# Patient Record
Sex: Female | Born: 1935
Health system: Southern US, Community
[De-identification: ages and names within clinical notes are randomized; demographics above are authoritative.]

## PROBLEM LIST (undated history)

## (undated) DIAGNOSIS — I4891 Unspecified atrial fibrillation: Secondary | ICD-10-CM

## (undated) DIAGNOSIS — C801 Malignant (primary) neoplasm, unspecified: Secondary | ICD-10-CM

## (undated) DIAGNOSIS — T4145XA Adverse effect of unspecified anesthetic, initial encounter: Secondary | ICD-10-CM

## (undated) DIAGNOSIS — E785 Hyperlipidemia, unspecified: Secondary | ICD-10-CM

## (undated) DIAGNOSIS — M1990 Unspecified osteoarthritis, unspecified site: Secondary | ICD-10-CM

## (undated) DIAGNOSIS — I10 Essential (primary) hypertension: Secondary | ICD-10-CM

## (undated) DIAGNOSIS — R7611 Nonspecific reaction to tuberculin skin test without active tuberculosis: Secondary | ICD-10-CM

## (undated) DIAGNOSIS — I499 Cardiac arrhythmia, unspecified: Secondary | ICD-10-CM

## (undated) DIAGNOSIS — T8859XA Other complications of anesthesia, initial encounter: Secondary | ICD-10-CM

## (undated) HISTORY — PX: APPENDECTOMY: SHX54

## (undated) HISTORY — PX: BREAST LUMPECTOMY: SHX2

## (undated) HISTORY — PX: CATARACT EXTRACTION: SUR2

## (undated) HISTORY — PX: CARDIAC CATHETERIZATION: SHX172

## (undated) HISTORY — PX: COLONOSCOPY W/ POLYPECTOMY: SHX1380

---

## 1966-06-23 DIAGNOSIS — R7611 Nonspecific reaction to tuberculin skin test without active tuberculosis: Secondary | ICD-10-CM

## 1966-06-23 HISTORY — DX: Nonspecific reaction to tuberculin skin test without active tuberculosis: R76.11

## 1975-09-20 HISTORY — PX: ABDOMINAL HYSTERECTOMY: SHX81

## 2001-02-15 HISTORY — PX: COLPORRHAPHY: SHX921

## 2008-06-15 HISTORY — PX: ROTATOR CUFF REPAIR: SHX139

## 2009-12-18 ENCOUNTER — Encounter: Admission: RE | Admit: 2009-12-18 | Discharge: 2009-12-18 | Payer: Self-pay | Admitting: Neurosurgery

## 2010-02-12 ENCOUNTER — Encounter: Admission: RE | Admit: 2010-02-12 | Discharge: 2010-02-12 | Payer: Self-pay | Admitting: Neurosurgery

## 2010-03-12 ENCOUNTER — Inpatient Hospital Stay (HOSPITAL_COMMUNITY): Admission: RE | Admit: 2010-03-12 | Discharge: 2010-03-14 | Payer: Self-pay | Admitting: Neurosurgery

## 2010-03-12 HISTORY — PX: BACK SURGERY: SHX140

## 2010-03-22 ENCOUNTER — Encounter: Admission: RE | Admit: 2010-03-22 | Discharge: 2010-03-22 | Payer: Self-pay | Admitting: Neurosurgery

## 2010-09-05 LAB — PROTIME-INR
INR: 0.96 (ref 0.00–1.49)
INR: 1.73 — ABNORMAL HIGH (ref 0.00–1.49)
Prothrombin Time: 13 seconds (ref 11.6–15.2)
Prothrombin Time: 20.4 seconds — ABNORMAL HIGH (ref 11.6–15.2)

## 2010-09-05 LAB — CBC
HCT: 42.9 % (ref 36.0–46.0)
Hemoglobin: 14.8 g/dL (ref 12.0–15.0)
MCH: 32.8 pg (ref 26.0–34.0)
MCHC: 34.5 g/dL (ref 30.0–36.0)
MCV: 95.1 fL (ref 78.0–100.0)
Platelets: 310 10*3/uL (ref 150–400)
RBC: 4.51 MIL/uL (ref 3.87–5.11)
RDW: 13 % (ref 11.5–15.5)
WBC: 7.3 10*3/uL (ref 4.0–10.5)

## 2010-09-05 LAB — BASIC METABOLIC PANEL
BUN: 15 mg/dL (ref 6–23)
CO2: 30 mEq/L (ref 19–32)
Calcium: 10.2 mg/dL (ref 8.4–10.5)
Chloride: 100 mEq/L (ref 96–112)
Creatinine, Ser: 0.77 mg/dL (ref 0.4–1.2)
GFR calc Af Amer: >60 mL/min (ref >60)
GFR calc non Af Amer: >60 mL/min (ref >60)
Glucose, Bld: 92 mg/dL (ref 70–99)
Potassium: 3.2 mEq/L — ABNORMAL LOW (ref 3.5–5.1)
Sodium: 139 mEq/L (ref 135–145)

## 2010-09-05 LAB — ABO/RH: ABO/RH(D): A POS

## 2010-09-05 LAB — TYPE AND SCREEN
ABO/RH(D): A POS
Antibody Screen: NEGATIVE

## 2010-09-05 LAB — APTT: aPTT: 38 seconds — ABNORMAL HIGH (ref 24–37)

## 2010-09-05 LAB — SURGICAL PCR SCREEN
MRSA, PCR: NEGATIVE
Staphylococcus aureus: NEGATIVE

## 2010-11-14 HISTORY — PX: BREAST SURGERY: SHX581

## 2012-06-11 ENCOUNTER — Telehealth: Payer: Self-pay

## 2015-03-05 ENCOUNTER — Other Ambulatory Visit: Payer: Self-pay | Admitting: Specialist

## 2015-03-05 DIAGNOSIS — M545 Low back pain: Secondary | ICD-10-CM

## 2015-03-07 ENCOUNTER — Other Ambulatory Visit: Payer: Self-pay

## 2015-03-09 ENCOUNTER — Inpatient Hospital Stay: Admission: RE | Admit: 2015-03-09 | Payer: Self-pay | Source: Ambulatory Visit

## 2015-03-14 ENCOUNTER — Ambulatory Visit
Admission: RE | Admit: 2015-03-14 | Discharge: 2015-03-14 | Disposition: A | Discharge status: Home / Self Care | Payer: PPO | Source: Ambulatory Visit | Attending: Specialist | Admitting: Specialist

## 2015-08-14 DIAGNOSIS — M5441 Lumbago with sciatica, right side: Secondary | ICD-10-CM | POA: Diagnosis not present

## 2015-08-23 DIAGNOSIS — M5416 Radiculopathy, lumbar region: Secondary | ICD-10-CM | POA: Diagnosis not present

## 2015-08-29 ENCOUNTER — Other Ambulatory Visit: Payer: Self-pay | Admitting: Specialist

## 2015-08-29 DIAGNOSIS — M545 Low back pain: Secondary | ICD-10-CM

## 2015-09-07 ENCOUNTER — Ambulatory Visit
Admission: RE | Admit: 2015-09-07 | Discharge: 2015-09-07 | Disposition: A | Discharge status: Home / Self Care | Payer: PPO | Source: Ambulatory Visit | Attending: Specialist | Admitting: Specialist

## 2015-09-07 DIAGNOSIS — M545 Low back pain: Secondary | ICD-10-CM

## 2015-09-07 DIAGNOSIS — M5126 Other intervertebral disc displacement, lumbar region: Secondary | ICD-10-CM | POA: Diagnosis not present

## 2015-09-07 MED ORDER — GADOBENATE DIMEGLUMINE 529 MG/ML IV SOLN
10.0000 mL | Freq: Once | INTRAVENOUS | Status: AC | PRN
  Administered 2015-09-07: 10 mL via INTRAVENOUS

## 2015-09-12 DIAGNOSIS — E785 Hyperlipidemia, unspecified: Secondary | ICD-10-CM | POA: Diagnosis not present

## 2015-09-12 DIAGNOSIS — M5441 Lumbago with sciatica, right side: Secondary | ICD-10-CM | POA: Diagnosis not present

## 2015-09-12 DIAGNOSIS — I10 Essential (primary) hypertension: Secondary | ICD-10-CM | POA: Diagnosis not present

## 2015-09-12 DIAGNOSIS — Z01818 Encounter for other preprocedural examination: Secondary | ICD-10-CM | POA: Diagnosis not present

## 2015-09-13 DIAGNOSIS — Z8679 Personal history of other diseases of the circulatory system: Secondary | ICD-10-CM | POA: Diagnosis not present

## 2015-09-13 DIAGNOSIS — Z01818 Encounter for other preprocedural examination: Secondary | ICD-10-CM | POA: Diagnosis not present

## 2015-09-17 DIAGNOSIS — E876 Hypokalemia: Secondary | ICD-10-CM | POA: Diagnosis not present

## 2015-09-24 NOTE — Pre-Procedure Instructions (Signed)
Samantha Garcia  09/24/2015     Your procedure is scheduled on : Friday September 28, 2015 at 12:30 PM.  Report to [facility] Admitting at 10:30 AM.  Call this number if you have problems the morning of surgery: [phone]    Remember:  Do not eat food or drink liquids after midnight.  Take these medicines the morning of surgery with A SIP OF WATER : Diltiazem (Cardizem), Gabapentin (Neurontin), Tramadol (Ultram) if needed   Please follow your physicians instructions regarding Xarelto   Stop taking any vitamins, herbal medications/supplements, NSAIDs, Ibuprofen, Advil, Motrin, Aleve, etc today   Do not wear jewelry, make-up or nail polish.  Do not wear lotions, powders, or perfumes.    Do not shave 48 hours prior to surgery.    Do not bring valuables to the hospital.  [facility] is not responsible for any belongings or valuables.  Contacts, dentures or bridgework may not be worn into surgery.  Leave your suitcase in the car.  After surgery it may be brought to your room.  For patients admitted to the hospital, discharge time will be determined by your treatment team.  Patients discharged the day of surgery will not be allowed to drive home.   Name and phone number of your driver:    Special instructions:  Shower using CHG soap the night before and the morning of your surgery  Please read over the following fact sheets that you were given. Pain Booklet, Coughing and Deep Breathing, MRSA Information and Surgical Site Infection Prevention

## 2015-09-25 ENCOUNTER — Encounter (HOSPITAL_COMMUNITY)
Admission: RE | Admit: 2015-09-25 | Discharge: 2015-09-25 | Disposition: A | Discharge status: Home / Self Care | Payer: PPO | Source: Ambulatory Visit | Attending: Specialist | Admitting: Specialist

## 2015-09-25 ENCOUNTER — Ambulatory Visit (HOSPITAL_COMMUNITY)
Admission: RE | Admit: 2015-09-25 | Discharge: 2015-09-25 | Disposition: A | Discharge status: Home / Self Care | Payer: PPO | Source: Ambulatory Visit | Attending: Surgery | Admitting: Surgery

## 2015-09-25 ENCOUNTER — Encounter (HOSPITAL_COMMUNITY): Payer: Self-pay

## 2015-09-25 DIAGNOSIS — Z87891 Personal history of nicotine dependence: Secondary | ICD-10-CM | POA: Diagnosis not present

## 2015-09-25 DIAGNOSIS — Z853 Personal history of malignant neoplasm of breast: Secondary | ICD-10-CM | POA: Diagnosis not present

## 2015-09-25 DIAGNOSIS — T8484XA Pain due to internal orthopedic prosthetic devices, implants and grafts, initial encounter: Secondary | ICD-10-CM | POA: Diagnosis not present

## 2015-09-25 DIAGNOSIS — Z7901 Long term (current) use of anticoagulants: Secondary | ICD-10-CM | POA: Diagnosis not present

## 2015-09-25 DIAGNOSIS — I4891 Unspecified atrial fibrillation: Secondary | ICD-10-CM | POA: Diagnosis not present

## 2015-09-25 DIAGNOSIS — M4316 Spondylolisthesis, lumbar region: Secondary | ICD-10-CM | POA: Diagnosis not present

## 2015-09-25 DIAGNOSIS — Z01818 Encounter for other preprocedural examination: Secondary | ICD-10-CM

## 2015-09-25 DIAGNOSIS — Y831 Surgical operation with implant of artificial internal device as the cause of abnormal reaction of the patient, or of later complication, without mention of misadventure at the time of the procedure: Secondary | ICD-10-CM | POA: Diagnosis not present

## 2015-09-25 DIAGNOSIS — Z79899 Other long term (current) drug therapy: Secondary | ICD-10-CM | POA: Diagnosis not present

## 2015-09-25 DIAGNOSIS — I10 Essential (primary) hypertension: Secondary | ICD-10-CM | POA: Diagnosis not present

## 2015-09-25 DIAGNOSIS — M5416 Radiculopathy, lumbar region: Secondary | ICD-10-CM | POA: Diagnosis not present

## 2015-09-25 DIAGNOSIS — I517 Cardiomegaly: Secondary | ICD-10-CM | POA: Diagnosis not present

## 2015-09-25 DIAGNOSIS — D62 Acute posthemorrhagic anemia: Secondary | ICD-10-CM | POA: Diagnosis not present

## 2015-09-25 HISTORY — DX: Malignant (primary) neoplasm, unspecified: C80.1

## 2015-09-25 HISTORY — DX: Unspecified osteoarthritis, unspecified site: M19.90

## 2015-09-25 HISTORY — DX: Nonspecific reaction to tuberculin skin test without active tuberculosis: R76.11

## 2015-09-25 HISTORY — DX: Adverse effect of unspecified anesthetic, initial encounter: T41.45XA

## 2015-09-25 HISTORY — DX: Cardiac arrhythmia, unspecified: I49.9

## 2015-09-25 HISTORY — DX: Essential (primary) hypertension: I10

## 2015-09-25 HISTORY — DX: Other complications of anesthesia, initial encounter: T88.59XA

## 2015-09-25 LAB — COMPREHENSIVE METABOLIC PANEL
ALT: 12 U/L — ABNORMAL LOW (ref 14–54)
AST: 20 U/L (ref 15–41)
Albumin: 4.1 g/dL (ref 3.5–5.0)
Alkaline Phosphatase: 44 U/L (ref 38–126)
Anion gap: 12 (ref 5–15)
BUN: 15 mg/dL (ref 6–20)
CO2: 25 mmol/L (ref 22–32)
Calcium: 10.2 mg/dL (ref 8.9–10.3)
Chloride: 104 mmol/L (ref 101–111)
Creatinine, Ser: 0.77 mg/dL (ref 0.44–1.00)
GFR calc Af Amer: >60 mL/min (ref >60)
GFR calc non Af Amer: >60 mL/min (ref >60)
Glucose, Bld: 90 mg/dL (ref 65–99)
Potassium: 3.4 mmol/L — ABNORMAL LOW (ref 3.5–5.1)
Sodium: 141 mmol/L (ref 135–145)
Total Bilirubin: 0.9 mg/dL (ref 0.3–1.2)
Total Protein: 6.8 g/dL (ref 6.5–8.1)

## 2015-09-25 LAB — SURGICAL PCR SCREEN
MRSA, PCR: NEGATIVE
Staphylococcus aureus: NEGATIVE

## 2015-09-25 LAB — PROTIME-INR
INR: 2.49 — ABNORMAL HIGH (ref 0.00–1.49)
Prothrombin Time: 26.6 seconds — ABNORMAL HIGH (ref 11.6–15.2)

## 2015-09-25 LAB — URINALYSIS, ROUTINE W REFLEX MICROSCOPIC
Bilirubin Urine: NEGATIVE
Glucose, UA: NEGATIVE mg/dL
Hgb urine dipstick: NEGATIVE
Ketones, ur: 15 mg/dL — AB
Nitrite: NEGATIVE
Protein, ur: NEGATIVE mg/dL
Specific Gravity, Urine: 1.02 (ref 1.005–1.030)
pH: 7 (ref 5.0–8.0)

## 2015-09-25 LAB — URINE MICROSCOPIC-ADD ON

## 2015-09-25 LAB — CBC
HCT: 41.1 % (ref 36.0–46.0)
Hemoglobin: 13.4 g/dL (ref 12.0–15.0)
MCH: 32.7 pg (ref 26.0–34.0)
MCHC: 32.6 g/dL (ref 30.0–36.0)
MCV: 100.2 fL — ABNORMAL HIGH (ref 78.0–100.0)
Platelets: 283 10*3/uL (ref 150–400)
RBC: 4.1 MIL/uL (ref 3.87–5.11)
RDW: 12.6 % (ref 11.5–15.5)
WBC: 7.7 10*3/uL (ref 4.0–10.5)

## 2015-09-25 LAB — APTT: aPTT: 34 seconds (ref 24–37)

## 2015-09-25 NOTE — Progress Notes (Signed)
A-11 sent down requesting small long knee high TED hose

## 2015-09-25 NOTE — Progress Notes (Signed)
Patient denied having any acute cardiac or pulmonary issues  Cardiologist is Chong Sicilian, and patient stated she recently had a EKG done. Will request records.   Nurse inquired about Xarelto and patient stated she was instructed to take her last dose of Xarelto tonight, and to stop it two days before surgery.   Clearance note on chart. Will send chart to Anesthesia for review.

## 2015-09-26 NOTE — Progress Notes (Signed)
Anesthesia Chart Review:  Pt is an 82 year old female scheduled for removal of R pedicle screws and rod L3, L4, L5 Wiltse approach on 09/28/2015 with Dr. Louanne Skye.   Cardiologist is Dr. Chong Sicilian (care everywhere) who has cleared pt for surgery. PCP is Dr. Jackquline Denmark (care everywhere) who has cleared pt for surgery.   PMH includes:  Atrial fibrillation, HTN, breast cancer. Former smoker. BMI 22  Medications include: chlorthalidone, diltiazem, potassium, tamoxifen, xarelto. Pt to stop xarelto 2 days before surgery.   Preoperative labs reviewed.  PT 26.6. Will repeat DOS.  Chest x-ray 09/25/15 reviewed.   1. Cardiomegaly no evidence of pulmonary venous congestion. 2. No acute pulmonary disease.  EKG 09/13/15: Atrial fibrillation (60 bpm). Low voltage QRS. Nonspecific ST and T wave abnormality.   If PT acceptable DOS, I anticipate pt can proceed as scheduled.   Willeen Cass, FNP-BC [facility]/Anesthesiology Phone: [phone] 09/26/2015 9:57 AM

## 2015-09-27 MED ORDER — CEFAZOLIN SODIUM-DEXTROSE 2-4 GM/100ML-% IV SOLN
2.0000 g | INTRAVENOUS | Status: AC
  Administered 2015-09-28: 2 g via INTRAVENOUS
  Filled 2015-09-27: qty 100

## 2015-09-27 NOTE — H&P (Signed)
Samantha Garcia is an 82 y.o. female.   She has undergone a right-sided epidural steroid injection, transforaminal at the L5 level.  This injection last year seemed to help her quite a bit, but she relates that the one on December 21 only seemed to last a short period of time.  She does not feel as though it did as well as the injection she had this past summer, which seemed to give her quite a bit of relief.  Reports that early last month, February 4, she fell walking to her  mailbox, landing on her left side.  She is better now.  Had an acute attack of pain in her back with radiation into the right leg.  Had difficulty even getting out of a chair.  Pain into her low back and into the right leg and into the calf.  Pain is still present in the calf area on the right side.  She has had no interval hospitalizations or change in her medications since her last visit on November 21, almost 3 months ago.  She relates the pain she is experiencing is severe when she stands and tries to ambulate.  Discomfort in the lateral thigh and lateral calf on the right side.  Her findings have shown right-sided pedicle screws and rods from L3 to L5.  The lowest screw appears to be encroaching on the right L5 neural foramen and she has a spondylolisthesis associated with the L5-S1 level.  Severe degenerative disc narrowing at the L5-S1 level so that I believe she does not have a great deal of motion; however, with the screw impinging on the upper 50% of the neural foramen, she does not have to move much for her to experience discomfort associated with this situation.  She has a collapsing degenerative scoliosis pattern with a left-sided curve in the dorsal area, then at the thoracolumbar junction in transitioning to a right-sided lumbar curve apexed to the right at about the L1 or L2 level.  Hips are well maintained.  SI joints appear to be open.  She has had XLIFs at both the L3-4 and L4-5 levels.  She relates that she feels like she is  ready to have something done to try and make this situation better.     Past Medical History  Diagnosis Date  . Complication of anesthesia     Pt stated it took her a long time to wake up after having Hysterectomy in the 70s, but has not had any issues since  . Dysrhythmia     Atrial Fibrillation  . Hypertension   . Cancer ([place])     Left Breast  . Arthritis   . PPD positive 1968    Past Surgical History  Procedure Laterality Date  . Abdominal hysterectomy  09/20/75  . Colporrhaphy  02/15/01    Anterior repair   . Rotator cuff repair Left 06/15/08  . Back surgery  03/12/10  . Breast surgery Left 11/14/10    DCIS carcinoma InSitu   . Breast lumpectomy Left   . Colonoscopy w/ polypectomy    . Appendectomy    . Cataract extraction Bilateral   . Cardiac catheterization      No family history on file. Social History:  reports that she has quit smoking. She does not have any smokeless tobacco history on file. She reports that she does not drink alcohol or use illicit drugs.  Allergies:  Allergies  Allergen Reactions  . Mushroom Extract Complex Hives  . Statins  Other (See Comments)    MUSCLE PAIN  . Amoxicillin-Pot Clavulanate Other (See Comments)    Loose stools    No prescriptions prior to admission    No results found for this or any previous visit (from the past 48 hour(s)). No results found.  Review of Systems  Constitutional: Negative.   HENT: Negative.   Respiratory: Negative.   Cardiovascular: Negative.   Genitourinary: Negative.   Musculoskeletal: Positive for back pain.  Skin: Negative.   Neurological: Negative.   Psychiatric/Behavioral: Negative.     There were no vitals taken for this visit. Physical Exam  Constitutional: She is oriented to person, place, and time. No distress.  HENT:  Head: Atraumatic.  Eyes: EOM are normal.  Neck: Normal range of motion.  Cardiovascular: Normal rate.   Respiratory: No respiratory distress.  GI: She exhibits no  distension.  Neurological: She is alert and oriented to person, place, and time.  Skin: Skin is warm and dry.  Psychiatric: She has a normal mood and affect.     PHYSICAL EXAMINATION:  She has some initial difficulty straightening out but is able to bend well and nearly touch her toes.  Extension is with pain and she lacks full extension of the lumbar spine.  Tends to walk stooped.  Her sciatic straight leg raise signs are negative.  Popliteal compression sign is negative.  Weakness of the right EHL is 4/5.  Foot dorsiflexion on the right also is decreased, 4+/5.  Her knee extension and flexion strength is normal.  Hip abduction and adduction and hip flexions testing is normal.    RADIOGRAPHS:  Her plain radiographs have demonstrated fixation along the right side with pedicle screws and rods at L3 to L5.  Lateral flexion and extension radiographs demonstrate fusion at the L3 to L5 levels with XLIFs present within the L3-4 to L4-5 disc space.  She has a grade 1, nearly grade 2, spondylolisthesis at L5-S1 with degenerative disc changes here.  The pedicle screw at L5 appears to be within the superior half of the neural foramen for L5.   ASSESSMENT:  Right L5 neural foraminal impingement secondary to spondylolisthesis with an L5 pedicle screw encroaching on the neural foramen.   PLAN:  The patient did well for a long period of time following her surgery, and then had increasing pain and discomfort this last summer.  It appears, though, she may be seeing a spondylolisthesis developing at L5-S1 but the encroachment of the pedicle screw contributes here.  I have indicated to Samantha Garcia that I think that removing the hardware is appropriate.  It has been nearly 6 years since her surgery so that removing the pedicle screws and rods should be able to be performed without difficulty here.  It would be done via Wiltse approach, as she had them originally placed in the first place.  These apparently are NuVasive DBR-type  instruments in reviewing this patient's charts.  The risks of surgery include risks of infection, bleeding, and neurologic compromise. It would involve the exposure and removal of the hardware alone.  I do not intend to perform any further, either fusion or decompression beyond the removal of the screw, which should be adequate in decreasing pressure within the neural foramen.  Interbody fusions are noted at both L3-4 and L4-5.  No indication of any significant ongoing pseudoarthrosis at these levels.  Indeed review of the CT scan suggests that these are solidly fused.  Will go ahead and schedule her for intervention.  It  will depend on the clearance by her primary care physician and that would be Dr. Darl Householder in [place].  She is also on Xarelto and the way that this is dealt with perioperatively would have to be determined as to whether or not she would have Lovenox for a short period   Lanae Crumbly, PA-C 09/27/2015, 4:31 PM  Patient examined and lab reviewed with Ricard Dillon, PA-C.

## 2015-09-28 ENCOUNTER — Observation Stay (HOSPITAL_COMMUNITY)
Admission: RE | Admit: 2015-09-28 | Discharge: 2015-09-29 | Disposition: A | Discharge status: Home / Self Care | Payer: PPO | Source: Ambulatory Visit | Attending: Specialist | Admitting: Specialist

## 2015-09-28 ENCOUNTER — Encounter (HOSPITAL_COMMUNITY)
Admission: RE | Disposition: A | Discharge status: Home / Self Care | Payer: Self-pay | Source: Ambulatory Visit | Attending: Specialist

## 2015-09-28 ENCOUNTER — Inpatient Hospital Stay (HOSPITAL_COMMUNITY): Payer: PPO | Admitting: Vascular Surgery

## 2015-09-28 ENCOUNTER — Encounter (HOSPITAL_COMMUNITY): Payer: Self-pay | Admitting: Surgery

## 2015-09-28 DIAGNOSIS — D62 Acute posthemorrhagic anemia: Secondary | ICD-10-CM | POA: Insufficient documentation

## 2015-09-28 DIAGNOSIS — M4316 Spondylolisthesis, lumbar region: Secondary | ICD-10-CM | POA: Insufficient documentation

## 2015-09-28 DIAGNOSIS — T8484XA Pain due to internal orthopedic prosthetic devices, implants and grafts, initial encounter: Principal | ICD-10-CM | POA: Diagnosis present

## 2015-09-28 DIAGNOSIS — I10 Essential (primary) hypertension: Secondary | ICD-10-CM | POA: Insufficient documentation

## 2015-09-28 DIAGNOSIS — G549 Nerve root and plexus disorder, unspecified: Secondary | ICD-10-CM | POA: Diagnosis not present

## 2015-09-28 DIAGNOSIS — Z79899 Other long term (current) drug therapy: Secondary | ICD-10-CM | POA: Insufficient documentation

## 2015-09-28 DIAGNOSIS — I4891 Unspecified atrial fibrillation: Secondary | ICD-10-CM | POA: Insufficient documentation

## 2015-09-28 DIAGNOSIS — M1990 Unspecified osteoarthritis, unspecified site: Secondary | ICD-10-CM | POA: Diagnosis not present

## 2015-09-28 DIAGNOSIS — Z853 Personal history of malignant neoplasm of breast: Secondary | ICD-10-CM | POA: Insufficient documentation

## 2015-09-28 DIAGNOSIS — M5416 Radiculopathy, lumbar region: Secondary | ICD-10-CM | POA: Diagnosis not present

## 2015-09-28 DIAGNOSIS — Y831 Surgical operation with implant of artificial internal device as the cause of abnormal reaction of the patient, or of later complication, without mention of misadventure at the time of the procedure: Secondary | ICD-10-CM | POA: Insufficient documentation

## 2015-09-28 DIAGNOSIS — Z7901 Long term (current) use of anticoagulants: Secondary | ICD-10-CM | POA: Insufficient documentation

## 2015-09-28 DIAGNOSIS — Z87891 Personal history of nicotine dependence: Secondary | ICD-10-CM | POA: Insufficient documentation

## 2015-09-28 HISTORY — PX: HARDWARE REMOVAL: SHX979

## 2015-09-28 LAB — PROTIME-INR
INR: 1.14 (ref 0.00–1.49)
Prothrombin Time: 14.8 seconds (ref 11.6–15.2)

## 2015-09-28 SURGERY — REMOVAL, HARDWARE
Anesthesia: General

## 2015-09-28 MED ORDER — ROCURONIUM BROMIDE 100 MG/10ML IV SOLN
INTRAVENOUS | Status: DC | PRN
  Administered 2015-09-28: 50 mg via INTRAVENOUS

## 2015-09-28 MED ORDER — LIDOCAINE HCL (CARDIAC) 20 MG/ML IV SOLN
INTRAVENOUS | Status: DC | PRN
  Administered 2015-09-28: 40 mg via INTRAVENOUS

## 2015-09-28 MED ORDER — POTASSIUM CHLORIDE CRYS ER 20 MEQ PO TBCR
20.0000 meq | EXTENDED_RELEASE_TABLET | Freq: Every day | ORAL | Status: DC
  Administered 2015-09-29: 20 meq via ORAL
  Filled 2015-09-28: qty 1

## 2015-09-28 MED ORDER — GABAPENTIN 300 MG PO CAPS
600.0000 mg | ORAL_CAPSULE | Freq: Every day | ORAL | Status: DC
  Administered 2015-09-28: 600 mg via ORAL
  Filled 2015-09-28: qty 2

## 2015-09-28 MED ORDER — MAGNESIUM OXIDE 400 (241.3 MG) MG PO TABS
400.0000 mg | ORAL_TABLET | Freq: Every day | ORAL | Status: DC
  Administered 2015-09-29: 400 mg via ORAL
  Filled 2015-09-28: qty 1

## 2015-09-28 MED ORDER — POLYETHYLENE GLYCOL 3350 17 G PO PACK: 17.0000 g | PACK | Freq: Every day | ORAL | Status: DC | PRN

## 2015-09-28 MED ORDER — ACETAMINOPHEN 650 MG RE SUPP: 650.0000 mg | RECTAL | Status: DC | PRN

## 2015-09-28 MED ORDER — THROMBIN 20000 UNITS EX SOLR
CUTANEOUS | Status: AC
  Filled 2015-09-28: qty 20000

## 2015-09-28 MED ORDER — ONDANSETRON HCL 4 MG/2ML IJ SOLN: 4.0000 mg | INTRAMUSCULAR | Status: DC | PRN

## 2015-09-28 MED ORDER — ZOLPIDEM TARTRATE 5 MG PO TABS: 5.0000 mg | ORAL_TABLET | Freq: Every evening | ORAL | Status: DC | PRN

## 2015-09-28 MED ORDER — ACETAMINOPHEN 325 MG PO TABS: 650.0000 mg | ORAL_TABLET | ORAL | Status: DC | PRN

## 2015-09-28 MED ORDER — CHOLECALCIFEROL 25 MCG (1000 UT) PO CAPS: 4000.0000 [IU] | ORAL_CAPSULE | Freq: Every day | ORAL | Status: DC

## 2015-09-28 MED ORDER — SUGAMMADEX SODIUM 200 MG/2ML IV SOLN
INTRAVENOUS | Status: AC
  Filled 2015-09-28: qty 2

## 2015-09-28 MED ORDER — SODIUM CHLORIDE 0.9% FLUSH
3.0000 mL | INTRAVENOUS | Status: DC | PRN
Start: 2015-09-28 — End: 2015-09-29

## 2015-09-28 MED ORDER — COENZYME Q10 100 MG PO CAPS: 300.0000 mg | ORAL_CAPSULE | Freq: Every day | ORAL | Status: DC

## 2015-09-28 MED ORDER — BUPIVACAINE HCL (PF) 0.5 % IJ SOLN
INTRAMUSCULAR | Status: DC | PRN
Start: 2015-09-28 — End: 2015-09-28
  Administered 2015-09-28: 10 mL

## 2015-09-28 MED ORDER — CHLORTHALIDONE 25 MG PO TABS
25.0000 mg | ORAL_TABLET | Freq: Every day | ORAL | Status: DC
  Administered 2015-09-28 – 2015-09-29 (×2): 25 mg via ORAL
  Filled 2015-09-28 (×2): qty 1

## 2015-09-28 MED ORDER — TAMOXIFEN CITRATE 10 MG PO TABS
20.0000 mg | ORAL_TABLET | Freq: Every day | ORAL | Status: DC
  Administered 2015-09-29: 20 mg via ORAL
  Filled 2015-09-28: qty 2

## 2015-09-28 MED ORDER — PHENYLEPHRINE HCL 10 MG/ML IJ SOLN
INTRAMUSCULAR | Status: DC | PRN
  Administered 2015-09-28: 40 ug via INTRAVENOUS

## 2015-09-28 MED ORDER — HYDROMORPHONE HCL 1 MG/ML IJ SOLN: 0.2500 mg | INTRAMUSCULAR | Status: DC | PRN

## 2015-09-28 MED ORDER — VITAMIN B COMPLEX PO TABS: 1.0000 | ORAL_TABLET | Freq: Every day | ORAL | Status: DC

## 2015-09-28 MED ORDER — HAWTHORN 565 MG PO CAPS: 1130.0000 mg | ORAL_CAPSULE | Freq: Every day | ORAL | Status: DC

## 2015-09-28 MED ORDER — SUGAMMADEX SODIUM 200 MG/2ML IV SOLN
INTRAVENOUS | Status: DC | PRN
  Administered 2015-09-28: 122 mg via INTRAVENOUS

## 2015-09-28 MED ORDER — SODIUM CHLORIDE 0.9% FLUSH
3.0000 mL | Freq: Two times a day (BID) | INTRAVENOUS | Status: DC
  Administered 2015-09-28 – 2015-09-29 (×2): 3 mL via INTRAVENOUS

## 2015-09-28 MED ORDER — TRAMADOL HCL 50 MG PO TABS: 100.0000 mg | ORAL_TABLET | Freq: Four times a day (QID) | ORAL | Status: DC | PRN

## 2015-09-28 MED ORDER — BUPIVACAINE LIPOSOME 1.3 % IJ SUSP
INTRAMUSCULAR | Status: DC | PRN
  Administered 2015-09-28: 13 mL

## 2015-09-28 MED ORDER — ZINC GLUCONATE 50 MG PO TABS: 50.0000 mg | ORAL_TABLET | Freq: Every day | ORAL | Status: DC

## 2015-09-28 MED ORDER — POTASSIUM CHLORIDE IN NACL 20-0.45 MEQ/L-% IV SOLN
INTRAVENOUS | Status: DC
  Administered 2015-09-28: 18:00:00 via INTRAVENOUS
  Filled 2015-09-28 (×2): qty 1000

## 2015-09-28 MED ORDER — GABAPENTIN 300 MG PO CAPS: 300.0000 mg | ORAL_CAPSULE | Freq: Two times a day (BID) | ORAL | Status: DC

## 2015-09-28 MED ORDER — MENTHOL 3 MG MT LOZG: 1.0000 | LOZENGE | OROMUCOSAL | Status: DC | PRN

## 2015-09-28 MED ORDER — ALPHA-LIPOIC ACID 200 MG PO CAPS: 400.0000 mg | ORAL_CAPSULE | Freq: Every day | ORAL | Status: DC

## 2015-09-28 MED ORDER — 0.9 % SODIUM CHLORIDE (POUR BTL) OPTIME
TOPICAL | Status: DC | PRN
  Administered 2015-09-28: 1000 mL

## 2015-09-28 MED ORDER — EPHEDRINE SULFATE 50 MG/ML IJ SOLN
INTRAMUSCULAR | Status: DC | PRN
  Administered 2015-09-28: 10 mg via INTRAVENOUS
  Administered 2015-09-28: 5 mg via INTRAVENOUS

## 2015-09-28 MED ORDER — TAMOXIFEN CITRATE 20 MG PO TABS: 20.0000 mg | ORAL_TABLET | Freq: Every day | ORAL | Status: DC

## 2015-09-28 MED ORDER — MAGNESIUM OXIDE 400 MG PO TABS: 400.0000 mg | ORAL_TABLET | Freq: Every day | ORAL | Status: DC

## 2015-09-28 MED ORDER — THROMBIN 20000 UNITS EX KIT
PACK | CUTANEOUS | Status: DC | PRN
  Administered 2015-09-28: 15:00:00 via TOPICAL

## 2015-09-28 MED ORDER — FENTANYL CITRATE (PF) 250 MCG/5ML IJ SOLN
INTRAMUSCULAR | Status: AC
  Filled 2015-09-28: qty 5

## 2015-09-28 MED ORDER — DOCUSATE SODIUM 100 MG PO CAPS
100.0000 mg | ORAL_CAPSULE | Freq: Two times a day (BID) | ORAL | Status: DC
  Administered 2015-09-28 – 2015-09-29 (×2): 100 mg via ORAL
  Filled 2015-09-28 (×2): qty 1

## 2015-09-28 MED ORDER — GLYCOPYRROLATE 0.2 MG/ML IJ SOLN
INTRAMUSCULAR | Status: DC | PRN
  Administered 2015-09-28: 0.2 mg via INTRAVENOUS

## 2015-09-28 MED ORDER — DILTIAZEM HCL ER COATED BEADS 120 MG PO CP24
120.0000 mg | ORAL_CAPSULE | Freq: Every day | ORAL | Status: DC
  Administered 2015-09-29: 120 mg via ORAL
  Filled 2015-09-28: qty 1

## 2015-09-28 MED ORDER — BISACODYL 5 MG PO TBEC: 5.0000 mg | DELAYED_RELEASE_TABLET | Freq: Every day | ORAL | Status: DC | PRN

## 2015-09-28 MED ORDER — SODIUM CHLORIDE 0.9 % IV SOLN: 250.0000 mL | INTRAVENOUS | Status: DC

## 2015-09-28 MED ORDER — CEFAZOLIN SODIUM 1-5 GM-% IV SOLN
1.0000 g | Freq: Three times a day (TID) | INTRAVENOUS | Status: AC
  Administered 2015-09-28 – 2015-09-29 (×2): 1 g via INTRAVENOUS
  Filled 2015-09-28 (×2): qty 50

## 2015-09-28 MED ORDER — PROPOFOL 10 MG/ML IV BOLUS
INTRAVENOUS | Status: DC | PRN
  Administered 2015-09-28: 80 mg via INTRAVENOUS

## 2015-09-28 MED ORDER — RIVAROXABAN 20 MG PO TABS
20.0000 mg | ORAL_TABLET | Freq: Every day | ORAL | Status: DC
  Administered 2015-09-29: 20 mg via ORAL
  Filled 2015-09-28: qty 1

## 2015-09-28 MED ORDER — DIPHENHYDRAMINE-APAP (SLEEP) 25-500 MG PO TABS: 1.0000 | ORAL_TABLET | Freq: Every evening | ORAL | Status: DC | PRN

## 2015-09-28 MED ORDER — FLEET ENEMA 7-19 GM/118ML RE ENEM: 1.0000 | ENEMA | Freq: Once | RECTAL | Status: DC | PRN

## 2015-09-28 MED ORDER — MORPHINE SULFATE (PF) 2 MG/ML IV SOLN: 1.0000 mg | INTRAVENOUS | Status: DC | PRN

## 2015-09-28 MED ORDER — ALUM & MAG HYDROXIDE-SIMETH 200-200-20 MG/5ML PO SUSP: 30.0000 mL | Freq: Four times a day (QID) | ORAL | Status: DC | PRN

## 2015-09-28 MED ORDER — VITAMIN D 1000 UNITS PO TABS
4000.0000 [IU] | ORAL_TABLET | Freq: Every day | ORAL | Status: DC
  Administered 2015-09-29: 4000 [IU] via ORAL
  Filled 2015-09-28: qty 4

## 2015-09-28 MED ORDER — PHENOL 1.4 % MT LIQD: 1.0000 | OROMUCOSAL | Status: DC | PRN

## 2015-09-28 MED ORDER — FENTANYL CITRATE (PF) 100 MCG/2ML IJ SOLN
INTRAMUSCULAR | Status: DC | PRN
  Administered 2015-09-28 (×2): 50 ug via INTRAVENOUS
  Administered 2015-09-28 (×2): 25 ug via INTRAVENOUS

## 2015-09-28 MED ORDER — B COMPLEX-C PO TABS
1.0000 | ORAL_TABLET | Freq: Every day | ORAL | Status: DC
  Administered 2015-09-29: 1 via ORAL
  Filled 2015-09-28: qty 1

## 2015-09-28 MED ORDER — LACTATED RINGERS IV SOLN
INTRAVENOUS | Status: DC
  Administered 2015-09-28 (×2): via INTRAVENOUS

## 2015-09-28 MED ORDER — HYDROCODONE-ACETAMINOPHEN 5-325 MG PO TABS
1.0000 | ORAL_TABLET | ORAL | Status: DC | PRN
  Administered 2015-09-29: 1 via ORAL
  Filled 2015-09-28: qty 1

## 2015-09-28 MED ORDER — ONDANSETRON HCL 4 MG/2ML IJ SOLN
INTRAMUSCULAR | Status: DC | PRN
  Administered 2015-09-28: 4 mg via INTRAVENOUS

## 2015-09-28 MED ORDER — GABAPENTIN 300 MG PO CAPS
300.0000 mg | ORAL_CAPSULE | Freq: Every day | ORAL | Status: DC
  Administered 2015-09-29: 300 mg via ORAL
  Filled 2015-09-28 (×2): qty 1

## 2015-09-28 MED ORDER — BUPIVACAINE LIPOSOME 1.3 % IJ SUSP
20.0000 mL | INTRAMUSCULAR | Status: DC
  Filled 2015-09-28: qty 20

## 2015-09-28 MED ORDER — BUPIVACAINE HCL (PF) 0.5 % IJ SOLN
INTRAMUSCULAR | Status: AC
  Filled 2015-09-28: qty 30

## 2015-09-28 MED ORDER — KETOROLAC TROMETHAMINE 30 MG/ML IJ SOLN: 30.0000 mg | Freq: Once | INTRAMUSCULAR | Status: DC

## 2015-09-28 MED ORDER — CHLORHEXIDINE GLUCONATE 4 % EX LIQD: 60.0000 mL | Freq: Once | CUTANEOUS | Status: DC

## 2015-09-28 SURGICAL SUPPLY — 54 items
BANDAGE ELASTIC 4 VELCRO ST LF (GAUZE/BANDAGES/DRESSINGS) IMPLANT
BANDAGE ELASTIC 6 VELCRO ST LF (GAUZE/BANDAGES/DRESSINGS) IMPLANT
BANDAGE ESMARK 6X9 LF (GAUZE/BANDAGES/DRESSINGS) IMPLANT
BNDG COHESIVE 4X5 TAN STRL (GAUZE/BANDAGES/DRESSINGS) IMPLANT
BNDG ESMARK 6X9 LF (GAUZE/BANDAGES/DRESSINGS)
BNDG GAUZE ELAST 4 BULKY (GAUZE/BANDAGES/DRESSINGS) IMPLANT
COVER SURGICAL LIGHT HANDLE (MISCELLANEOUS) ×2 IMPLANT
CUFF TOURNIQUET SINGLE 18IN (TOURNIQUET CUFF) ×2 IMPLANT
CUFF TOURNIQUET SINGLE 24IN (TOURNIQUET CUFF) IMPLANT
CUFF TOURNIQUET SINGLE 34IN LL (TOURNIQUET CUFF) IMPLANT
CUFF TOURNIQUET SINGLE 44IN (TOURNIQUET CUFF) IMPLANT
DERMABOND ADVANCED (GAUZE/BANDAGES/DRESSINGS) ×1
DERMABOND ADVANCED .7 DNX12 (GAUZE/BANDAGES/DRESSINGS) ×1 IMPLANT
DRAPE C-ARM 42X72 X-RAY (DRAPES) ×2 IMPLANT
DRAPE EXTREMITY T 121X128X90 (DRAPE) IMPLANT
DRAPE INCISE IOBAN 66X45 STRL (DRAPES) ×2 IMPLANT
DRAPE ORTHO SPLIT 77X108 STRL (DRAPES)
DRAPE PROXIMA HALF (DRAPES) ×2 IMPLANT
DRAPE SURG ORHT 6 SPLT 77X108 (DRAPES) IMPLANT
DRAPE U-SHAPE 47X51 STRL (DRAPES) IMPLANT
DRSG EMULSION OIL 3X3 NADH (GAUZE/BANDAGES/DRESSINGS) IMPLANT
DRSG MEPILEX BORDER 4X4 (GAUZE/BANDAGES/DRESSINGS) ×2 IMPLANT
DRSG PAD ABDOMINAL 8X10 ST (GAUZE/BANDAGES/DRESSINGS) IMPLANT
DURAPREP 26ML APPLICATOR (WOUND CARE) IMPLANT
ELECT REM PT RETURN 9FT ADLT (ELECTROSURGICAL) ×2
ELECTRODE REM PT RTRN 9FT ADLT (ELECTROSURGICAL) ×1 IMPLANT
GAUZE SPONGE 4X4 12PLY STRL (GAUZE/BANDAGES/DRESSINGS) IMPLANT
GLOVE BIOGEL PI IND STRL 8 (GLOVE) ×1 IMPLANT
GLOVE BIOGEL PI INDICATOR 8 (GLOVE) ×1
GLOVE ECLIPSE 9.0 STRL (GLOVE) ×2 IMPLANT
GLOVE ORTHO TXT STRL SZ7.5 (GLOVE) ×2 IMPLANT
GLOVE SURG 8.5 LATEX PF (GLOVE) ×2 IMPLANT
GOWN STRL REUS W/ TWL LRG LVL3 (GOWN DISPOSABLE) ×2 IMPLANT
GOWN STRL REUS W/TWL 2XL LVL3 (GOWN DISPOSABLE) ×4 IMPLANT
GOWN STRL REUS W/TWL LRG LVL3 (GOWN DISPOSABLE) ×2
KIT BASIN OR (CUSTOM PROCEDURE TRAY) ×2 IMPLANT
KIT ROOM TURNOVER OR (KITS) ×2 IMPLANT
MANIFOLD NEPTUNE II (INSTRUMENTS) ×2 IMPLANT
NS IRRIG 1000ML POUR BTL (IV SOLUTION) ×2 IMPLANT
PACK GENERAL/GYN (CUSTOM PROCEDURE TRAY) ×2 IMPLANT
PACK LAMINECTOMY ORTHO (CUSTOM PROCEDURE TRAY) ×2 IMPLANT
PAD ARMBOARD 7.5X6 YLW CONV (MISCELLANEOUS) ×4 IMPLANT
PAD CAST 4YDX4 CTTN HI CHSV (CAST SUPPLIES) ×1 IMPLANT
PADDING CAST COTTON 4X4 STRL (CAST SUPPLIES) ×1
STAPLER VISISTAT 35W (STAPLE) IMPLANT
STOCKINETTE IMPERVIOUS 9X36 MD (GAUZE/BANDAGES/DRESSINGS) IMPLANT
SUT ETHILON 4 0 FS 1 (SUTURE) IMPLANT
SUT VIC AB 0 CT1 27 (SUTURE)
SUT VIC AB 0 CT1 27XBRD ANBCTR (SUTURE) IMPLANT
SUT VIC AB 2-0 CT1 27 (SUTURE)
SUT VIC AB 2-0 CT1 TAPERPNT 27 (SUTURE) IMPLANT
TOWEL OR 17X24 6PK STRL BLUE (TOWEL DISPOSABLE) ×2 IMPLANT
TOWEL OR 17X26 10 PK STRL BLUE (TOWEL DISPOSABLE) ×2 IMPLANT
WATER STERILE IRR 1000ML POUR (IV SOLUTION) ×2 IMPLANT

## 2015-09-28 NOTE — Transfer of Care (Signed)
Immediate Anesthesia Transfer of Care Note  Patient: Samantha Garcia  Procedure(s) Performed: Procedure(s): REMOVAL OF RIGHT PEDICLE SCREWS AND ROD L3, L4, L5 Wiltse approach (N/A)  Patient Location: PACU  Anesthesia Type:General  Level of Consciousness: oriented and patient cooperative, drowsy  Airway & Oxygen Therapy: Patient Spontanous Breathing and Patient connected to nasal cannula oxygen  Post-op Assessment: Report given to RN and Post -op Vital signs reviewed and stable  Post vital signs: Reviewed and stable  Last Vitals:  Filed Vitals:   09/28/15 1100  BP: 165/73  Pulse: 57  Temp: 36.8 C  Resp: 20    Complications: No apparent anesthesia complications

## 2015-09-28 NOTE — Anesthesia Postprocedure Evaluation (Signed)
Anesthesia Post Note  Patient: Samantha Garcia  Procedure(s) Performed: Procedure(s) (LRB): REMOVAL OF RIGHT PEDICLE SCREWS AND ROD L3, L4, L5 Wiltse approach (N/A)  Patient location during evaluation: PACU Anesthesia Type: General Level of consciousness: awake and alert Pain management: pain level controlled Vital Signs Assessment: post-procedure vital signs reviewed and stable Respiratory status: spontaneous breathing, nonlabored ventilation, respiratory function stable and patient connected to nasal cannula oxygen Cardiovascular status: blood pressure returned to baseline and stable Postop Assessment: no signs of nausea or vomiting Anesthetic complications: no    Last Vitals:  Filed Vitals:   09/28/15 1600 09/28/15 1615  BP: 141/69 126/66  Pulse: 69 58  Temp:  36.7 C  Resp: 17 16    Last Pain:  Filed Vitals:   09/28/15 1627  PainSc: 0-No pain                 Ralyn Stlaurent A

## 2015-09-28 NOTE — Anesthesia Procedure Notes (Signed)
Procedure Name: Intubation Date/Time: 09/28/2015 2:06 PM Performed by: Bethel Born Pre-anesthesia Checklist: Emergency Drugs available, Patient identified, Suction available, Patient being monitored and Timeout performed Patient Re-evaluated:Patient Re-evaluated prior to inductionOxygen Delivery Method: Circle system utilized Preoxygenation: Pre-oxygenation with 100% oxygen Intubation Type: IV induction Ventilation: Mask ventilation without difficulty Laryngoscope Size: Mac and 3 Grade View: Grade I Tube type: Oral Tube size: 7.0 mm Number of attempts: 1 Airway Equipment and Method: Stylet Placement Confirmation: ETT inserted through vocal cords under direct vision,  positive ETCO2 and breath sounds checked- equal and bilateral Secured at: 22 cm Tube secured with: Tape Dental Injury: Teeth and Oropharynx as per pre-operative assessment

## 2015-09-28 NOTE — Discharge Instructions (Signed)
° ° °  No lifting greater than 10 lbs. Avoid bending, stooping and twisting. Walk in house for first week them may start to get out slowly increasing distance up to one block by 3 weeks post op. Keep incision dry for 3 days, may use tegaderm or similar water impervious dressing.

## 2015-09-28 NOTE — Anesthesia Preprocedure Evaluation (Addendum)
Anesthesia Evaluation  Patient identified by MRN, date of birth, ID band Patient awake    Reviewed: Allergy & Precautions, H&P , NPO status , Patient's Chart, lab work & pertinent test results  Airway Mallampati: II  TM Distance: >3 FB Neck ROM: Full    Dental no notable dental hx. (+) Teeth Intact, Dental Advisory Given   Pulmonary neg pulmonary ROS, former smoker,    Pulmonary exam normal breath sounds clear to auscultation       Cardiovascular hypertension, Pt. on medications + dysrhythmias Atrial Fibrillation  Rhythm:Regular Rate:Normal     Neuro/Psych negative neurological ROS  negative psych ROS   GI/Hepatic negative GI ROS, Neg liver ROS,   Endo/Other  negative endocrine ROS  Renal/GU negative Renal ROS  negative genitourinary   Musculoskeletal  (+) Arthritis , Osteoarthritis,    Abdominal   Peds  Hematology negative hematology ROS (+)   Anesthesia Other Findings   Reproductive/Obstetrics negative OB ROS                            Anesthesia Physical Anesthesia Plan  ASA: II  Anesthesia Plan: General   Post-op Pain Management:    Induction: Intravenous  Airway Management Planned: Oral ETT  Additional Equipment:   Intra-op Plan:   Post-operative Plan: Extubation in OR  Informed Consent: I have reviewed the patients History and Physical, chart, labs and discussed the procedure including the risks, benefits and alternatives for the proposed anesthesia with the patient or authorized representative who has indicated his/her understanding and acceptance.   Dental advisory given  Plan Discussed with: CRNA  Anesthesia Plan Comments:         Anesthesia Quick Evaluation

## 2015-09-28 NOTE — Brief Op Note (Signed)
PATIENT ID:      Samantha Garcia  MRN:     [id number] DOB/AGE:    11-11-1931 / 82 y.o.       OPERATIVE REPORT   DATE OF PROCEDURE:  09/28/2015      PREOPERATIVE DIAGNOSIS:   Right L5 nerve compression due to pedicle screw                                                       There is no weight on file to calculate BMI.    POSTOPERATIVE DIAGNOSIS:   Right L5 nerve compression due to pedicle screw                                                                     There is no weight on file to calculate BMI.    PROCEDURE:  Procedure(s): REMOVAL OF RIGHT PEDICLE SCREWS AND ROD L3, L4, L5 Wiltse approach    SURGEON: NITKA,JAMES E   ASSISTANT: Esaw Grandchild          ANESTHESIA:  General and supplemented with local marcaine 0.5% 1:1 exparel 1.3% total 30cc.Dr. Ola Spurr.  EBL:<50cc  DRAINS:None  COMPLICATIONS:  None   CONDITION:  stable    NITKA,JAMES E 09/28/2015, 3:02 PM

## 2015-09-28 NOTE — Interval H&P Note (Signed)
Patient was seen and examined in the preop holding area. There has been no interval  Change in this patient's exam preop  history and physical exam  Lab tests and images have been examined and reviewed.  The Risks benefits and alternative treatments have been discussed  extensively,questions answered.  The patient has elected to undergo the discussed surgical treatment. 

## 2015-09-28 NOTE — Op Note (Signed)
09/28/2015  3:05 PM  PATIENT:  Samantha Garcia  82 y.o. female  MRN: [id number]  OPERATIVE REPORT  PRE-OPERATIVE DIAGNOSIS:  Right L5 nerve compression due to pedicle screw  POST-OPERATIVE DIAGNOSIS:  Right L5 nerve compression due to pedicle screw  PROCEDURE:  Procedure(s): REMOVAL OF RIGHT PEDICLE SCREWS AND ROD L3, L4, L5 Wiltse approach    SURGEON:  Jessy Oto, MD     ASSISTANT:  Benjiman Core, PA-C  (Present throughout the entire procedure and necessary for completion of procedure in a timely manner)     ANESTHESIA:  General,supplemented with local marcaine 0.5% 1:1 exparel 1.3% total 30cc, Dr. Oren Bracket.    COMPLICATIONS:  None.     COMPONENTS: Removal of nuvasive 3x pedicle screws and single rod and 3 caps.  PROCEDURE: The patient was met in the holding area, and the appropriate right L5 identified and marked with an "X" and my initials.    The patient was then transported to OR and then placed under  general anesthesia without difficulty. She  was placed on the operative table in a prone position with wilson frame. The patient received appropriate preoperative antibiotic prophylaxis. Sterile prep with Iodophor and draped from the lower dorsal spine to S3. Iodine vi-drape was used.  Time out protocol was carried out and correct. The old incision scar right L4-5 sagitally oriented was ellipsed and excised after infiltration with marcaine 0.5% 1:1 exparel 1.3% total 15cc. The subcutaneous layers divided inline with the skin incision. The right paralumbar muscles then sharply incised inline with the incision and in line with previous scar in the paralumbar muscles. The hardware on the right side pedicle screws and rods able to be palpated and with a Cobb elevator the pedicle  screws and rods were bluntly exposed. Hand rongeur then used to remove scar over the posterior hardware exposing the rods and each of the fasteners of the pedicle screws. Each of the nuvasive  fastener caps were then removed and the rod further exposed with the rongeur. The rod then removed with a Kocher clamp. Each of the fasteners for the rod were then debrided of the scar within the rod opening and the screw interface and then each of the pedicle screws on the right side were removed with slight traction on the screw while turning the screw counterclockwise. There was no significant active bleeding. Irrigation was carried out with copious saline solution. The subcutaneus and deep fascial layers then infiltrated with further local solution to a combined total of 30cc. The incision then closed by approximating the subcutaneus lumbar fascia with #1 Vicryl, the subcutaneuous fatty layer with 2-0 Vicryl sutures, interrupted and then the subcutaneous skin with a running suture of 3-0 vicryl. The skin then sealed with dermabond. A dry dressing of mepilex then applied. All instrument and sponge counts were correct.  The patient was then returned to the stretcher, extubated following reactivation and then returned to the recovery room in satisfactory condition. Benjiman Core PA-C perform the duties of assistant surgeon during this case. He was present from the beginning of the case to the end of the case assisting in transfer the patient from his stretcher to the OR table and back to the stretcher at the end of the case. Assisted in careful retraction and suction of the surgical site delicate structures. He performed closure of the incision from the fascia to the skin applying the dressing.          Revonda Menter E  09/28/2015, 3:05 PM

## 2015-09-29 DIAGNOSIS — T8484XA Pain due to internal orthopedic prosthetic devices, implants and grafts, initial encounter: Secondary | ICD-10-CM | POA: Diagnosis not present

## 2015-09-29 MED ORDER — HYDROCODONE-ACETAMINOPHEN 5-325 MG PO TABS: 1.0000 | ORAL_TABLET | ORAL | Status: DC | PRN

## 2015-09-29 NOTE — Progress Notes (Signed)
Occupational Therapy Evaluation/Discharge Patient Details Name: Samantha Garcia MRN: [id number] DOB: [birth year] Today's Date: 09/29/2015    History of Present Illness 82 y.o. female s/p removal of R pedicle screws and rod, L3, L4, L5 Wiltse approach. PMH significant for HTN, dysrhythmia/a fib, arthritis, L breast cancer, and L rotator cuff repair (2009).   Clinical Impression   PTA, pt was independent with ADLs and mobility. Pt currently requires supervision for LB ADLs and functional transfers. Educated pt on back precautions, positioning for sleep, compensatory strategies for LB ADLs, use of 3in1 as shower seat, and activity restrictions. Pt voiced concerns about being able to garden - advised pt to adapt activity to planting vegetables in pots while sitting at table and to take frequent breaks to adhere to precautions. Also reviewed energy conservation, pain management, and fall prevention strategies. All education has been completed and pt has no further questions. Pt with no further acute OT needs. OT signing off.      No OT follow up;Supervision - Intermittent    Equipment Recommendations  None recommended by OT    Recommendations for Other Services       Precautions / Restrictions Precautions Precautions: Back;Fall Precaution Booklet Issued: Yes (comment) Precaution Comments: Pt able to recall 1/3 back precautions at beginning of session. Reviewed all precautions and handout. Restrictions Weight Bearing Restrictions: No      Mobility Bed Mobility Overal bed mobility: Modified Independent             General bed mobility comments: Good demonstration of log roll technique without cues  Transfers Overall transfer level: Needs assistance Equipment used: None Transfers: Sit to/from Stand Sit to Stand: Supervision         General transfer comment: Supervision for safety. No physical assist required. No reports of dizziness or LOB observed.    Balance Overall  balance assessment: Needs assistance Sitting-balance support: No upper extremity supported;Feet supported Sitting balance-Leahy Scale: Good     Standing balance support: No upper extremity supported;During functional activity Standing balance-Leahy Scale: Good                              ADL Overall ADL's : Needs assistance/impaired     Grooming: Wash/dry hands;Supervision/safety;Standing   Upper Body Bathing: Supervision/ safety;Sitting   Lower Body Bathing: Supervison/ safety;Sit to/from stand;Cueing for compensatory techniques Lower Body Bathing Details (indicate cue type and reason): Able to cross ankle-over-knee, has long-handled sponge at home Upper Body Dressing : Supervision/safety;Sitting   Lower Body Dressing: Supervision/safety;Sit to/from stand;Cueing for compensatory techniques Lower Body Dressing Details (indicate cue type and reason): Able to cross ankle-over-knee Toilet Transfer: Supervision/safety;Cueing for safety;BSC;Ambulation Toilet Transfer Details (indicate cue type and reason): BSC over toilet, cues to avoid twisting by looking back behinc her Toileting- Clothing Manipulation and Hygiene: Supervision/safety;Sit to/from stand;Cueing for compensatory techniques Toileting - Clothing Manipulation Details (indicate cue type and reason): Educated on use of wet wipes for pericare, cues to avoid twisting to wipe from behind after BM Tub/ Shower Transfer: Tub transfer;Min guard;Ambulation;3 in 1;Cueing for safety;Cueing for sequencing Tub/Shower Transfer Details (indicate cue type and reason): Provided handout for use of 3in1 as shower seat, cues to have pt's friend move 3in1 from toilet to shower to avoid lifting heavy objects Functional mobility during ADLs: Min guard General ADL Comments: Reviewed back precautions, positioning for sleep, compensatory strategies for ADLs, gradually increasing activity level, compensatroy strategies for gardening, energy  conservation, and fall prevention  strategies. No friends or family present for OT evaluation.     Vision Vision Assessment?: No apparent visual deficits   Perception     Praxis      Pertinent Vitals/Pain Pain Assessment: 0-10 Pain Score: 1  Pain Location: back Pain Descriptors / Indicators: Sore Pain Intervention(s): Monitored during session;Repositioned     Hand Dominance Left   Extremity/Trunk Assessment Upper Extremity Assessment Upper Extremity Assessment: Overall WFL for tasks assessed   Lower Extremity Assessment Lower Extremity Assessment: Overall WFL for tasks assessed   Cervical / Trunk Assessment Cervical / Trunk Assessment: Normal   Communication Communication Communication: No difficulties   Cognition Arousal/Alertness: Awake/alert Behavior During Therapy: WFL for tasks assessed/performed Overall Cognitive Status: Within Functional Limits for tasks assessed       Memory: Decreased recall of precautions             General Comments       Exercises       Shoulder Instructions      Home Living Family/patient expects to be discharged to:: Private residence Living Arrangements: Alone Available Help at Discharge: Friend(s);Available 24 hours/day (for first 3 days, then intermittently) Type of Home: House Home Access: Stairs to enter [employer] of Steps: 3 Entrance Stairs-Rails: Right;Left;Can reach both Home Layout: Two level;Able to live on main level with bedroom/bathroom Alternate Level Stairs-Number of Steps: flight   Bathroom Shower/Tub: Tub/shower unit;Curtain Shower/tub characteristics: Architectural technologist: Standard     Home Equipment: Environmental consultant - 2 wheels;Cane - single point;Bedside commode;Grab bars - tub/shower;Adaptive equipment Adaptive Equipment: Long-handled sponge        Prior Functioning/Environment Level of Independence: Independent        Comments: [place]     OT Diagnosis: Acute pain   OT Problem List: Decreased strength;Decreased activity tolerance;Impaired balance (sitting and/or standing);Decreased safety awareness;Decreased knowledge of use of DME or AE;Decreased knowledge of precautions;Pain   OT Treatment/Interventions:      OT Goals(Current goals can be found in the care plan section) Acute Rehab OT Goals Patient Stated Goal: to do everything right so that pain never comes back OT Goal Formulation: With patient Time For Goal Achievement: 10/13/15 Potential to Achieve Goals: Good  OT Frequency:     Barriers to D/C:            Co-evaluation              End of Session Equipment Utilized During Treatment: Gait belt Nurse Communication: Mobility status  Activity Tolerance: Patient tolerated treatment well Patient left: in chair;with call bell/phone within reach   Time: [id number] OT Time Calculation (min): 46 min Charges:  OT General Charges $OT Visit: 1 Procedure OT Evaluation $OT Eval Moderate Complexity: 1 Procedure OT Treatments $Self Care/Home Management : 23-37 mins G-Codes: OT G-codes **NOT FOR INPATIENT CLASS** Functional Assessment Tool Used: clinical judgement Functional Limitation: Self care Self Care Current Status [id number]): At least 1 percent but less than 20 percent impaired, limited or restricted Self Care Goal Status [id number]): At least 1 percent but less than 20 percent impaired, limited or restricted Self Care Discharge Status [id number]): At least 1 percent but less than 20 percent impaired, limited or restricted  Redmond Baseman, OTR/L Pager: [phone] 09/29/2015, 10:22 AM

## 2015-09-29 NOTE — Discharge Summary (Addendum)
Physician Discharge Summary      Patient ID: Samantha Garcia MRN: [id number] DOB/AGE: December 07, 1931 82 y.o.  Admit date: 09/28/2015 Discharge date: 09/29/2015  Admission Diagnoses:  Right lumbar radiculopathy  Discharge Diagnoses:  Principal Problem:   Right lumbar radiculopathy Active Problems:   Painful orthopaedic hardware   Past Medical History  Diagnosis Date  . Complication of anesthesia     Pt stated it took her a long time to wake up after having Hysterectomy in the 70s, but has not had any issues since  . Dysrhythmia     Atrial Fibrillation  . Hypertension   . Cancer ([place])     Left Breast  . Arthritis   . PPD positive 1968    Surgeries: Procedure(s): REMOVAL OF RIGHT PEDICLE SCREWS AND ROD L3, L4, L5 Wiltse approach on 09/28/2015   Consultants (if any):    Discharged Condition: Improved  Hospital Course: Samantha Garcia is an 82 y.o. female who was admitted 09/28/2015 with a diagnosis of Right lumbar radiculopathy and went to the operating room on 09/28/2015 and underwent the above named procedures.    She was given perioperative antibiotics:      Anti-infectives    Start     Dose/Rate Route Frequency Ordered Stop   09/28/15 2200  ceFAZolin (ANCEF) IVPB 1 g/50 mL premix     1 g 100 mL/hr over 30 Minutes Intravenous Every 8 hours 09/28/15 1635 09/29/15 0639   09/28/15 1200  ceFAZolin (ANCEF) IVPB 2g/100 mL premix     2 g 200 mL/hr over 30 Minutes Intravenous To [facility] 09/27/15 1036 09/28/15 1415    .  She was given sequential compression devices, early ambulation, and xarelto for DVT prophylaxis.  She benefited maximally from the hospital stay and there were no complications.    Recent vital signs:  Filed Vitals:   09/29/15 0000 09/29/15 0422  BP: 119/82 123/86  Pulse: 79 72  Temp: 98.4 F (36.9 C) 98.8 F (37.1 C)  Resp: 16 16    Recent laboratory studies:  Lab Results  Component Value Date   HGB 13.4 09/25/2015   HGB 14.8 03/06/2010    Lab Results  Component Value Date   WBC 7.7 09/25/2015   PLT 283 09/25/2015   Lab Results  Component Value Date   INR 1.14 09/28/2015   Lab Results  Component Value Date   NA 141 09/25/2015   K 3.4* 09/25/2015   CL 104 09/25/2015   CO2 25 09/25/2015   BUN 15 09/25/2015   CREATININE 0.77 09/25/2015   GLUCOSE 90 09/25/2015    Discharge Medications:     Medication List    TAKE these medications        Alpha-Lipoic Acid 200 MG Caps  Take 400 mg by mouth daily.     chlorthalidone 25 MG tablet  Commonly known as:  HYGROTON  Take 25 mg by mouth daily.     Coenzyme Q10 100 MG capsule  Take 300 mg by mouth daily.     D 1000 1000 units capsule  Generic drug:  Cholecalciferol  Take 4,000 Units by mouth daily.     diltiazem 120 MG 24 hr capsule  Commonly known as:  CARDIZEM CD  Take 120 mg by mouth daily.     diphenhydramine-acetaminophen 25-500 MG Tabs tablet  Commonly known as:  TYLENOL PM  Take 1 tablet by mouth at bedtime as needed.     gabapentin 300 MG capsule  Commonly known as:  NEURONTIN  Take 300-600 mg by mouth 2 (two) times daily. 300mg  in the morning, 600mg  at bedtime     Hawthorn 565 MG Caps  Take 1,130 mg by mouth daily.     HYDROcodone-acetaminophen 5-325 MG tablet  Commonly known as:  NORCO/VICODIN  Take 1-2 tablets by mouth every 4 (four) hours as needed for severe pain (mild pain).     KLOR-CON M20 20 MEQ tablet  Generic drug:  potassium chloride SA  Take 20 mEq by mouth daily.     magnesium oxide 400 MG tablet  Commonly known as:  MAG-OX  Take 400 mg by mouth daily.     tamoxifen 20 MG tablet  Commonly known as:  NOLVADEX  Take 20 mg by mouth daily.     traMADol 50 MG tablet  Commonly known as:  ULTRAM  Take 2 tablets (100 mg total) by mouth every 6 (six) hours as needed for severe pain. May take 1 or 2 tablets po every 6 hours as needed.     Vitamin B Complex Tabs  Take 1 tablet by mouth daily.     XARELTO 20 MG Tabs tablet   Generic drug:  rivaroxaban  Take 20 mg by mouth daily.     zinc gluconate 50 MG tablet  Take 50 mg by mouth daily.        Diagnostic Studies: Dg Chest 2 View  09/25/2015  CLINICAL DATA:  Lumbar spine surgery. EXAM: CHEST  2 VIEW COMPARISON:  03/06/2016. FINDINGS: Mediastinum hilar structures normal. Lungs are clear of infiltrates. Tiny calcified pulmonary nodules consistent granulomas. No pleural effusion or pneumothorax. Cardiomegaly with normal pulmonary vascularity .Prior lumbar spine surgery. Severe thoracolumbar scoliosis. IMPRESSION: 1. Cardiomegaly no evidence of pulmonary venous congestion. 2. No acute pulmonary disease. Electronically Signed   By: Marcello Moores  Register   On: 09/25/2015 14:21   Mr Lumbar Spine W Wo Contrast  09/07/2015  CLINICAL DATA:  Low back pain and right leg pain for 6 months. Creatinine was obtained on site at [facility] at [address] Results: Creatinine 0.7 mg/dL. EXAM: MRI LUMBAR SPINE WITHOUT AND WITH CONTRAST TECHNIQUE: Multiplanar and multiecho pulse sequences of the lumbar spine were obtained without and with intravenous contrast. CONTRAST:  62mL MULTIHANCE GADOBENATE DIMEGLUMINE 529 MG/ML IV SOLN COMPARISON:  CT scan dated 03/14/2015 and lumbar MRI dated 09/28/2009 FINDINGS: Normal conus tip at L2. Normal paraspinal soft tissues except for postsurgical changes in the lower lumbar spine. Scoliosis with convexity to the right centered at L2-3. T12-L1 and L1-2:  No significant abnormality. L2-3: Progressive degenerative disc disease with further narrowing of the left side of the disc space. Tiny broad-based disc bulge with accompanying osteophytes. Moderate left foraminal stenosis, unchanged. L3-4: Interbody and posterior fusion with right pedicle screws with 2 mm retrolisthesis. No disc bulging or protrusion. No neural impingement. Slight degenerative changes of the facet joints. L4-5: Interbody and posterior fusion the pedicle screws on the right.  Widely patent neural foramina. No neural impingement. L5-S1: Previously demonstrated left pars defect. 5 mm spondylolisthesis slightly increased since the prior CT scan. Severe right foraminal stenosis which should compress the right L5 nerve. Previous CT scan demonstrates loosening of the right pedicle screw with migration of the screw into the superior aspect of the right neural foramen which contributes to the foraminal impingement. Left neural foramen is widely patent. Moderately severe left facet arthritis. After contrast administration there is only slight enhancement around the left L5-S1 facet joint. IMPRESSION: 1. Severe right  foraminal stenosis at L5-S1 which should affect the right L5 nerve. Increasing spondylolisthesis of L5 on S1. Chronic left pars defect. 2. No other neural impingement. Electronically Signed   By: Lorriane Shire M.D.   On: 09/07/2015 17:02    Disposition:   Discharge Instructions    Call MD / Call 911    Complete by:  As directed   If you experience chest pain or shortness of breath, CALL 911 and be transported to the hospital emergency room.  If you develope a fever above 101 F, pus (white drainage) or increased drainage or redness at the wound, or calf pain, call your surgeon's office.     Call MD / Call 911    Complete by:  As directed   If you experience chest pain or shortness of breath, CALL 911 and be transported to the hospital emergency room.  If you develope a fever above 101.5 F, pus (white drainage) or increased drainage or redness at the wound, or calf pain, call your surgeon's office.     Constipation Prevention    Complete by:  As directed   Drink plenty of fluids.  Prune juice may be helpful.  You may use a stool softener, such as Colace (over the counter) 100 mg twice a day.  Use MiraLax (over the counter) for constipation as needed.     Constipation Prevention    Complete by:  As directed   Drink plenty of fluids.  Prune juice may be helpful.  You may  use a stool softener, such as Colace (over the counter) 100 mg twice a day.  Use MiraLax (over the counter) for constipation as needed.     Diet - low sodium heart healthy    Complete by:  As directed      Diet - low sodium heart healthy    Complete by:  As directed      Diet general    Complete by:  As directed      Discharge instructions    Complete by:  As directed   No lifting greater than 10 lbs. Avoid bending, stooping and twisting. Walk in house for first week them may start to get out slowly increasing distance up to one block by 3 weeks post op. Keep incision dry for 3 days, may use tegaderm or similar water impervious dressing.     Driving restrictions    Complete by:  As directed   No driving for 4 weeks     Driving restrictions    Complete by:  As directed   No driving while taking narcotic pain meds.     Increase activity slowly as tolerated    Complete by:  As directed      Increase activity slowly as tolerated    Complete by:  As directed      Lifting restrictions    Complete by:  As directed   No lifting for 8 weeks           Follow-up Information    Follow up with Dellas Guard E, MD In 2 weeks.   Specialty:  Orthopedic Surgery   Contact information:   [facility] [place] [address] [phone]        Signed: Jessy Oto 09/29/2015, 10:50 AM

## 2015-09-29 NOTE — Care Management Note (Signed)
Case Management Note  Patient Details  Name: IDABELLE MCPETERS MRN: [id number] Date of Birth: 1931-10-22  Subjective/Objective: 82 yo F s/p removal of R pedicle screws and rod, L3, L4, L5 Wiltse approach                Action/Plan: received referral to assist with [place] needs and DME   Expected Discharge Date:    09/29/15              Expected Discharge Plan:  Home/Self Care  In-House Referral:     Discharge planning Services  CM Consult  Post Acute Care Choice:    Choice offered to:     DME Arranged:    DME Agency:     HH Arranged:    [place] Agency:     Status of Service:  Completed, signed off  Medicare Important Message Given:    Date Medicare IM Given:    Medicare IM give by:    Date Additional Medicare IM Given:    Additional Medicare Important Message give by:     If discussed at [place] of Stay Meetings, dates discussed:    Additional Comments: met with pt. She lives alone. She plans to return home with the support of her niece who is staying with her for a couple of days and she has a lof of friends. PT did not recommend HH or DME.  Norina Buzzard, RN 09/29/2015, 11:23 AM

## 2015-09-29 NOTE — Progress Notes (Signed)
   Subjective:  Patient reports pain as mild.    Objective:   VITALS:   Filed Vitals:   09/28/15 1822 09/28/15 2115 09/29/15 0000 09/29/15 0422  BP: 140/60 111/58 119/82 123/86  Pulse: 62 62 79 72  Temp: 98 F (36.7 C) 98.3 F (36.8 C) 98.4 F (36.9 C) 98.8 F (37.1 C)  TempSrc: Oral  Oral   Resp: 16 16 16 16   SpO2: 100% 96% 97% 97%    Neurologically intact ABD soft Neurovascular intact Sensation intact distally Intact pulses distally Dorsiflexion/Plantar flexion intact Incision: dressing C/D/I and no drainage No cellulitis present Compartment soft   Lab Results  Component Value Date   WBC 7.7 09/25/2015   HGB 13.4 09/25/2015   HCT 41.1 09/25/2015   MCV 100.2* 09/25/2015   PLT 283 09/25/2015     Assessment/Plan:  1 Day Post-Op   - Expected postop acute blood loss anemia - will monitor for symptoms - Up with PT/OT - DVT ppx - SCDs, ambulation, xarelto - UAT - Pain controlled - Discharge planing - home today  Marianna Payment 09/29/2015, 9:00 AM [phone]

## 2015-09-29 NOTE — Evaluation (Signed)
Physical Therapy Evaluation Patient Details Name: Samantha Garcia MRN: [id number] DOB: 1931/08/25 Today's Date: 09/29/2015   History of Present Illness  82 y.o. female s/p removal of R pedicle screws and rod, L3, L4, L5 Wiltse approach. PMH significant for HTN, dysrhythmia/a fib, arthritis, L breast cancer, and L rotator cuff repair (2009).  Clinical Impression  Patient is s/p above surgery resulting in the deficits listed below (see PT Problem List). Pt requires v/c's for adherence to back precautions otherwise is functioning at supervision level. Patient will benefit from skilled PT to increase their independence and safety with mobility (while adhering to their precautions) to allow discharge to the venue listed below.     Follow Up Recommendations No PT follow up;Supervision/Assistance - 24 hour    Equipment Recommendations  None recommended by PT    Recommendations for Other Services       Precautions / Restrictions Precautions Precautions: Back;Fall Precaution Booklet Issued: Yes (comment) Precaution Comments: pt unable to recall precautions, pt re-educated with verbal confrimation, went over handout again Restrictions Weight Bearing Restrictions: No      Mobility  Bed Mobility Overal bed mobility: Modified Independent             General bed mobility comments: pt up in chair upon PT arrival  Transfers Overall transfer level: Needs assistance Equipment used: None Transfers: Sit to/from Stand Sit to Stand: Supervision         General transfer comment: pt demo'd good technique and used UEs  Ambulation/Gait Ambulation/Gait assistance: Min guard Ambulation Distance (Feet): 500 Feet Assistive device: None Gait Pattern/deviations: Step-through pattern;Staggering left;Staggering right Gait velocity: decreased, guarded   General Gait Details: pt with 3 episodes of cross over gait pattern requring min guard to regain balance  Stairs Stairs: Yes Stairs  assistance: Min guard Stair Management: One rail Right;Alternating pattern Number of Stairs: 2 General stair comments: pt with good technique and no episodes of LOB  Wheelchair Mobility    Modified Rankin (Stroke Patients Only)       Balance Overall balance assessment: Needs assistance Sitting-balance support: No upper extremity supported;Feet supported Sitting balance-Leahy Scale: Good     Standing balance support: No upper extremity supported Standing balance-Leahy Scale: Good                               Pertinent Vitals/Pain Pain Assessment: 0-10 Pain Score: 1  Pain Location: back  Pain Descriptors / Indicators: Sore Pain Intervention(s): Monitored during session    Home Living Family/patient expects to be discharged to:: Private residence Living Arrangements: Alone Available Help at Discharge: Friend(s);Available 24 hours/day Type of Home: House Home Access: Stairs to enter Entrance Stairs-Rails: Right;Left;Can reach both Entrance Stairs-Number of Steps: 3 Home Layout: Two level;Able to live on main level with bedroom/bathroom Home Equipment: Gilford Rile - 2 wheels;Cane - single point;Bedside commode;Grab bars - tub/shower;Adaptive equipment      Prior Function Level of Independence: Independent         Comments: [place]     Hand Dominance   Dominant Hand: Left    Extremity/Trunk Assessment   Upper Extremity Assessment: Overall WFL for tasks assessed           Lower Extremity Assessment: Overall WFL for tasks assessed      Cervical / Trunk Assessment:  (recent back surgery, forward head)  Communication   Communication: No difficulties  Cognition Arousal/Alertness: Awake/alert Behavior  During Therapy: WFL for tasks assessed/performed Overall Cognitive Status: Within Functional Limits for tasks assessed       Memory: Decreased recall of precautions              General Comments       Exercises        Assessment/Plan    PT Assessment Patient needs continued PT services  PT Diagnosis Difficulty walking;Generalized weakness   PT Problem List Decreased strength;Decreased activity tolerance;Decreased balance;Decreased mobility;Decreased knowledge of precautions  PT Treatment Interventions DME instruction;Gait training;Stair training;Functional mobility training;Therapeutic activities;Therapeutic exercise;Balance training;Patient/family education   PT Goals (Current goals can be found in the Care Plan section) Acute Rehab PT Goals Patient Stated Goal: get back to where i was PT Goal Formulation: With patient Time For Goal Achievement: 10/06/15 Potential to Achieve Goals: Good    Frequency Min 3X/week   Barriers to discharge        Co-evaluation               End of Session Equipment Utilized During Treatment: Gait belt Activity Tolerance: Patient tolerated treatment well Patient left: in chair;with call bell/phone within reach      Functional Assessment Tool Used: clinical judgement Functional Limitation: Mobility: Walking and moving around Mobility: Walking and Moving Around Current Status [id number]): At least 1 percent but less than 20 percent impaired, limited or restricted Mobility: Walking and Moving Around Goal Status [phone]): At least 1 percent but less than 20 percent impaired, limited or restricted    Time: 1007-1021 PT Time Calculation (min) (ACUTE ONLY): 14 min   Charges:   PT Evaluation $PT Eval Low Complexity: 1 Procedure     PT G Codes:   PT G-Codes **NOT FOR INPATIENT CLASS** Functional Assessment Tool Used: clinical judgement Functional Limitation: Mobility: Walking and moving around Mobility: Walking and Moving Around Current Status [id number]): At least 1 percent but less than 20 percent impaired, limited or restricted Mobility: Walking and Moving Around Goal Status [phone]): At least 1 percent but less than 20 percent impaired,  limited or restricted    Kingsley Callander 09/29/2015, 11:06 AM   Kittie Plater, PT, DPT Pager #: [phone] Office #: [phone]

## 2015-10-02 ENCOUNTER — Encounter (HOSPITAL_COMMUNITY): Payer: Self-pay | Admitting: Specialist

## 2015-11-22 DIAGNOSIS — M5416 Radiculopathy, lumbar region: Secondary | ICD-10-CM | POA: Diagnosis not present

## 2015-12-07 DIAGNOSIS — Z90710 Acquired absence of both cervix and uterus: Secondary | ICD-10-CM | POA: Diagnosis not present

## 2015-12-07 DIAGNOSIS — Z79899 Other long term (current) drug therapy: Secondary | ICD-10-CM | POA: Diagnosis not present

## 2015-12-07 DIAGNOSIS — M859 Disorder of bone density and structure, unspecified: Secondary | ICD-10-CM | POA: Diagnosis not present

## 2015-12-07 DIAGNOSIS — Z780 Asymptomatic menopausal state: Secondary | ICD-10-CM | POA: Diagnosis not present

## 2015-12-07 DIAGNOSIS — M810 Age-related osteoporosis without current pathological fracture: Secondary | ICD-10-CM | POA: Diagnosis not present

## 2015-12-13 DIAGNOSIS — M810 Age-related osteoporosis without current pathological fracture: Secondary | ICD-10-CM | POA: Diagnosis not present

## 2015-12-13 DIAGNOSIS — M549 Dorsalgia, unspecified: Secondary | ICD-10-CM | POA: Diagnosis not present

## 2015-12-13 DIAGNOSIS — C50912 Malignant neoplasm of unspecified site of left female breast: Secondary | ICD-10-CM | POA: Diagnosis not present

## 2015-12-13 DIAGNOSIS — M1990 Unspecified osteoarthritis, unspecified site: Secondary | ICD-10-CM | POA: Diagnosis not present

## 2015-12-13 DIAGNOSIS — R922 Inconclusive mammogram: Secondary | ICD-10-CM | POA: Diagnosis not present

## 2015-12-13 DIAGNOSIS — C50919 Malignant neoplasm of unspecified site of unspecified female breast: Secondary | ICD-10-CM | POA: Diagnosis not present

## 2016-02-18 DIAGNOSIS — M25561 Pain in right knee: Secondary | ICD-10-CM | POA: Diagnosis not present

## 2016-02-18 DIAGNOSIS — M25562 Pain in left knee: Secondary | ICD-10-CM | POA: Diagnosis not present

## 2016-02-18 DIAGNOSIS — M170 Bilateral primary osteoarthritis of knee: Secondary | ICD-10-CM | POA: Diagnosis not present

## 2016-02-27 DIAGNOSIS — R262 Difficulty in walking, not elsewhere classified: Secondary | ICD-10-CM | POA: Diagnosis not present

## 2016-02-27 DIAGNOSIS — M170 Bilateral primary osteoarthritis of knee: Secondary | ICD-10-CM | POA: Diagnosis not present

## 2016-02-27 DIAGNOSIS — M25561 Pain in right knee: Secondary | ICD-10-CM | POA: Diagnosis not present

## 2016-02-27 DIAGNOSIS — M25562 Pain in left knee: Secondary | ICD-10-CM | POA: Diagnosis not present

## 2016-03-12 DIAGNOSIS — M25561 Pain in right knee: Secondary | ICD-10-CM | POA: Diagnosis not present

## 2016-03-12 DIAGNOSIS — M1711 Unilateral primary osteoarthritis, right knee: Secondary | ICD-10-CM | POA: Diagnosis not present

## 2016-03-20 DIAGNOSIS — M25562 Pain in left knee: Secondary | ICD-10-CM | POA: Diagnosis not present

## 2016-03-20 DIAGNOSIS — M1712 Unilateral primary osteoarthritis, left knee: Secondary | ICD-10-CM | POA: Diagnosis not present

## 2016-03-26 DIAGNOSIS — M1711 Unilateral primary osteoarthritis, right knee: Secondary | ICD-10-CM | POA: Diagnosis not present

## 2016-03-26 DIAGNOSIS — M25561 Pain in right knee: Secondary | ICD-10-CM | POA: Diagnosis not present

## 2016-04-02 DIAGNOSIS — M25562 Pain in left knee: Secondary | ICD-10-CM | POA: Diagnosis not present

## 2016-04-02 DIAGNOSIS — M1712 Unilateral primary osteoarthritis, left knee: Secondary | ICD-10-CM | POA: Diagnosis not present

## 2016-04-09 DIAGNOSIS — M1711 Unilateral primary osteoarthritis, right knee: Secondary | ICD-10-CM | POA: Diagnosis not present

## 2016-04-09 DIAGNOSIS — M25561 Pain in right knee: Secondary | ICD-10-CM | POA: Diagnosis not present

## 2016-04-14 DIAGNOSIS — H1045 Other chronic allergic conjunctivitis: Secondary | ICD-10-CM | POA: Diagnosis not present

## 2016-04-16 DIAGNOSIS — M25562 Pain in left knee: Secondary | ICD-10-CM | POA: Diagnosis not present

## 2016-04-16 DIAGNOSIS — M1712 Unilateral primary osteoarthritis, left knee: Secondary | ICD-10-CM | POA: Diagnosis not present

## 2016-04-22 DIAGNOSIS — M170 Bilateral primary osteoarthritis of knee: Secondary | ICD-10-CM | POA: Diagnosis not present

## 2016-04-22 DIAGNOSIS — M25562 Pain in left knee: Secondary | ICD-10-CM | POA: Diagnosis not present

## 2016-04-22 DIAGNOSIS — M25561 Pain in right knee: Secondary | ICD-10-CM | POA: Diagnosis not present

## 2016-05-06 ENCOUNTER — Ambulatory Visit: Payer: PPO | Admitting: Physical Therapy

## 2016-05-12 DIAGNOSIS — M791 Myalgia: Secondary | ICD-10-CM | POA: Diagnosis not present

## 2016-05-12 DIAGNOSIS — I10 Essential (primary) hypertension: Secondary | ICD-10-CM | POA: Diagnosis not present

## 2016-05-12 DIAGNOSIS — M810 Age-related osteoporosis without current pathological fracture: Secondary | ICD-10-CM | POA: Diagnosis not present

## 2016-05-12 DIAGNOSIS — M1990 Unspecified osteoarthritis, unspecified site: Secondary | ICD-10-CM | POA: Diagnosis not present

## 2016-05-12 DIAGNOSIS — I4891 Unspecified atrial fibrillation: Secondary | ICD-10-CM | POA: Diagnosis not present

## 2016-05-12 DIAGNOSIS — Z8679 Personal history of other diseases of the circulatory system: Secondary | ICD-10-CM | POA: Diagnosis not present

## 2016-05-12 DIAGNOSIS — R319 Hematuria, unspecified: Secondary | ICD-10-CM | POA: Diagnosis not present

## 2016-05-12 DIAGNOSIS — E785 Hyperlipidemia, unspecified: Secondary | ICD-10-CM | POA: Diagnosis not present

## 2016-05-12 DIAGNOSIS — M25569 Pain in unspecified knee: Secondary | ICD-10-CM | POA: Diagnosis not present

## 2016-05-26 ENCOUNTER — Encounter: Payer: Self-pay | Admitting: Physical Therapy

## 2016-05-26 ENCOUNTER — Ambulatory Visit: Payer: PPO | Attending: Physical Medicine & Rehabilitation | Admitting: Physical Therapy

## 2016-05-26 DIAGNOSIS — G8929 Other chronic pain: Secondary | ICD-10-CM | POA: Insufficient documentation

## 2016-05-26 DIAGNOSIS — M6281 Muscle weakness (generalized): Secondary | ICD-10-CM | POA: Diagnosis not present

## 2016-05-26 DIAGNOSIS — M25562 Pain in left knee: Secondary | ICD-10-CM | POA: Diagnosis not present

## 2016-05-26 DIAGNOSIS — M25561 Pain in right knee: Secondary | ICD-10-CM | POA: Insufficient documentation

## 2016-05-26 NOTE — Therapy (Signed)
[place] MAIN [facility] SERVICES [address] [place], [place], [address] Phone: [phone]   Fax:  [phone]  Physical Therapy Evaluation  Patient Details  Name: Samantha Garcia MRN: [id number] Date of Birth: April 09, 1932 Referring Provider: Joan Mayans MD  Encounter Date: 05/26/2016      PT End of Session - 05/26/16 1307    Visit Number 1   Number of Visits 13   Date for PT Re-Evaluation 07/07/16   Authorization Type gcode 1   Authorization Time Period 10   PT Start Time 1100   PT Stop Time 1155   PT Time Calculation (min) 55 min   Activity Tolerance Patient tolerated treatment well   Behavior During Therapy [facility] for tasks assessed/performed      Past Medical History:  Diagnosis Date  . Arthritis    B Knee  . Cancer ([place])    Left Breast  . Complication of anesthesia    Pt stated it took her a long time to wake up after having Hysterectomy in the 70s, but has not had any issues since  . Dysrhythmia    Atrial Fibrillation  . Hypertension    controlled;   . PPD positive 1968    Past Surgical History:  Procedure Laterality Date  . ABDOMINAL HYSTERECTOMY  09/20/75  . APPENDECTOMY    . BACK SURGERY  03/12/10  . BREAST LUMPECTOMY Left   . BREAST SURGERY Left 11/14/10   DCIS carcinoma InSit[facility]   . CARDIAC CATHETERIZATION    . CATARACT EXTRACTION Bilateral   . COLONOSCOPY W/ POLYPECTOMY    . COLPORRHAPHY  02/15/01   Anterior repair   . HARDWARE REMOVAL N/A 09/28/2015   Procedure: REMOVAL OF RIGHT PEDICLE SCREWS AND ROD L3, L4, L5 Wiltse approach;  Surgeon: Jessy Oto, MD;  Location: [place];  Service: Orthopedics;  Laterality: N/A;  . ROTATOR CUFF REPAIR Left 06/15/08    There were no vitals filed for this visit.       Subjective Assessment - 05/26/16 1112    Subjective 82 yo Female reports increased BLE knee pain. She has been diagnosed with OA. She reports getting a series of knee injections which has not seemed to help. She  also had 2 surgeries on her back including fusion and removal of hardware. She reports that after removing the hardware she reports less pain in RLE. She reports having increased pain with getting up/down into a seat. She reports having a harder time with mobility. Patient lives in a two story home with bed/bath on first floor. She reports going up/down steps for first time in a few months. She reports having to negotiate steps to clean home and get ready for holidays. She reports negotiating steps 1 step at a time; she reports that at this time she is trying to avoid surgery and is hoping that PT will help with pain; She reports being active when she was younger and reports being disheartened not being able to do things now. She has used a walker/cane before but doesn't feel like it helps. She is currently walking without it. She denies any recent falls; She reports having numbness in BUE hands, she reports numbness in feet especially at night;    Pertinent History personal factors affecting rehab include: Progressive OA, lives in 2 story home, hx of CA, multiple surgeries to back,    Limitations Standing;Other (comment)  transfers;    How long can you sit comfortably? NA   How  long can you stand comfortably? 15 min   How long can you walk comfortably? once she gets going she feels she can walk a fair distance;    Patient Stated Goals be able to climb stairs with less pain; be able to move around easier; avoid surgery;    Currently in Pain? No/denies  no pain at rest;    Pain Onset More than a month ago   Pain Frequency Intermittent   Aggravating Factors  sit to stand pain 6/10 with movement;    Pain Relieving Factors rest, heat/ice temporarily relieves symptoms;             [facility] PT Assessment - 05/26/16 0001      Assessment   Medical Diagnosis BLE knee OA   Referring Provider Joan Mayans MD   Onset Date/Surgical Date --  69 years   Hand Dominance Left   Next MD Visit Feb 2018    Prior Therapy denies any PT for this condition;      Precautions   Precautions None     Restrictions   Weight Bearing Restrictions No     Balance Screen   Has the patient fallen in the past 6 months No   Has the patient had a decrease in activity level because of a fear of falling?  Yes   Is the patient reluctant to leave their home because of a fear of falling?  No     Home Environment   Additional Comments lives in 2 story home with bed/bath on main level; grandson lives with her; still independent in self care ADLs; grandson helps with yard work;      Prior Function   Level of Independence Independent;Independent with gait;Independent with transfers   Vocation Retired   Leisure socialize with friends; reading, used to love to travel;      Cognition   Overall Cognitive Status Within Functional Limits for tasks assessed     Observation/Other Assessments   Lower Extremity Functional Scale  39/80 (the lower the score the greater the disability)     Sensation   Light Touch Appears Intact   Additional Comments no numbness/tingling in BLE at time of evaluation;      Coordination   Gross Motor Movements are Fluid and Coordinated Yes   Fine Motor Movements are Fluid and Coordinated Yes     Posture/Postural Control   Posture Comments sits with slight slumped posture, able to self correct with verbal cues;      AROM   Overall AROM Comments BUE and BLE AROM is [facility]     Strength   Right Hip Flexion 4/5   Right Hip ABduction 4-/5   Right Hip ADduction 4-/5   Left Hip Flexion 4/5   Left Hip ABduction 4-/5   Left Hip ADduction 4-/5   Right Knee Flexion 4-/5   Right Knee Extension 3+/5   Left Knee Flexion 4-/5   Left Knee Extension 3+/5   Right Ankle Dorsiflexion 4-/5   Right Ankle Plantar Flexion 3/5   Left Ankle Dorsiflexion 4-/5   Left Ankle Plantar Flexion 3/5     Palpation   Palpation comment has signifcant crepetis in BLE at knees with all knee AROM; has less crepetis  with quad set with good patella mobility;      Transfers   Comments requires BUE to push from chair for sit<>Stand transfers due to knee pain and weakness;      Ambulation/Gait   Gait Comments ambulates independent without AD  with normal base of support, slower gait speed and decreased knee flexion during swing phase;      Standardized Balance Assessment   Five times sit to stand comments  48 sec with pushing with BUE (>15 sec indicates high fall risk)   10 Meter Walk 0.95 m/s without AD (community ambulator)         TREATMENT: Initiated HEP with knee ROM and quad sets. Please see patient instructions.                   PT Education - 05/26/16 1306    Education provided Yes   Education Details HEP initiated, ROM/strengthening   Person(s) Educated Patient   Methods Explanation;Verbal cues   Comprehension Verbalized understanding;Returned demonstration;Verbal cues required             PT Long Term Goals - 05/26/16 1312      PT LONG TERM GOAL #1   Title Patient will be independent in home exercise program to improve strength/mobility for better functional independence with ADLs.   Time 6   Period Weeks   Status New     PT LONG TERM GOAL #2   Title Patient (> 75 years old) will complete five times sit to stand test in < 20 seconds indicating an increased LE strength and improved balance.   Time 6   Period Weeks   Status New     PT LONG TERM GOAL #3   Title Patient will ascend/descend 4 stairs with rail assist mod- independently without loss of balance to improve ability to get in/out of home.    Time 6   Period Weeks   Status New     PT LONG TERM GOAL #4   Title Patient will increase lower extremity functional scale to >60/80 to demonstrate improved functional mobility and increased tolerance with ADLs.    Time 6   Period Weeks   Status New     PT LONG TERM GOAL #5   Title Patient will increase BLE gross strength to 4+/5 as to improve functional  strength for independent gait, increased standing tolerance and increased ADL ability.   Time 6   Period Weeks   Status New               Plan - 05/26/16 1307    Clinical Impression Statement 82 yo Female presents to physical therapy with increased BLE knee pain especially with transfers and weight bearing tasks. Patient reports having severe OA for many years. She reports that she is hoping to avoid surgery. Patient demonstrates weakness in BLE. She also demonstrates crepitis with movement She ambulates well with reciprocal gait pattern, without AD, demonstrating decreased knee flexion during swing. She would benefit from additional skilled PT intervention to improve strength, reduce knee pain and improve mobility;    Rehab Potential Fair   Clinical Impairments Affecting Rehab Potential positive: good PLOF; negative: chronic condition, progressive OA, age, fall risk; Patient's clinical presentation is evolving as OA is a progressive condition and she has severe OA in BLE knee;    PT Frequency 2x / week   PT Duration 6 weeks   PT Treatment/Interventions Aquatic Therapy;Cryotherapy;Gait training;Moist Heat;Stair training;Functional mobility training;Therapeutic activities;Therapeutic exercise;Balance training;Neuromuscular re-education;Manual techniques;Patient/family education;Taping;Dry needling   PT Next Visit Plan advance HEP, work on strengthening, weight bearing activities   PT Home Exercise Plan initiated- see patient instructions   Consulted and Agree with Plan of Care Patient      Patient will benefit from  skilled therapeutic intervention in order to improve the following deficits and impairments:  Pain, Decreased mobility, Decreased activity tolerance, Decreased endurance, Decreased strength, Impaired flexibility, Decreased balance  Visit Diagnosis: Chronic pain of left knee - Plan: PT plan of care cert/re-cert  Chronic pain of right knee - Plan: PT plan of care  cert/re-cert  Muscle weakness (generalized) - Plan: PT plan of care cert/re-cert      G-Codes - [id number]    Functional Assessment Tool Used clinical judgement, LEFs, 10 meter walk, 5 times sit<>stand   Functional Limitation Mobility: Walking and moving around   Mobility: Walking and Moving Around Current Status [id number]) At least 40 percent but less than 60 percent impaired, limited or restricted   Mobility: Walking and Moving Around Goal Status [phone]) At least 20 percent but less than 40 percent impaired, limited or restricted       Problem List Patient Active Problem List   Diagnosis Date Noted  . Right lumbar radiculopathy 09/28/2015    Class: Chronic  . Painful orthopaedic hardware ([place]) 09/28/2015    Class: Chronic    Trotter,Margaret PT, DPT 05/26/2016, 1:15 PM  [place] MAIN [facility] SERVICES [address] [place], [place], [address] Phone: [phone]   Fax:  [phone]  Name: Samantha Garcia MRN: [id number] Date of Birth: [birth year]

## 2016-05-26 NOTE — Patient Instructions (Addendum)
KNEE: Extension, Long Arc Quads - Sitting    Raise leg until knee is straight and then slowly lower. Be sure to avoid painful range of motion and only go to where its comfortable.  _10__ reps per set, __2_ sets per day, _5__ days per week  Copyright  VHI. All rights reserved.  Quad Sets    Slowly tighten thigh muscles of straight, left leg while counting out loud to ___5_. Relax. Repeat __10__ times. Do __2__ sessions per day.  http://[email or web address].exer.us/293   Copyright  VHI. All rights reserved.

## 2016-05-28 ENCOUNTER — Encounter: Payer: Self-pay | Admitting: Physical Therapy

## 2016-05-28 ENCOUNTER — Ambulatory Visit: Payer: PPO | Admitting: Physical Therapy

## 2016-05-28 DIAGNOSIS — G8929 Other chronic pain: Secondary | ICD-10-CM

## 2016-05-28 DIAGNOSIS — M25562 Pain in left knee: Secondary | ICD-10-CM | POA: Diagnosis not present

## 2016-05-28 DIAGNOSIS — M25561 Pain in right knee: Secondary | ICD-10-CM

## 2016-05-28 DIAGNOSIS — M6281 Muscle weakness (generalized): Secondary | ICD-10-CM

## 2016-05-28 NOTE — Patient Instructions (Signed)
WALKING  Walking is a great form of exercise to increase your strength, endurance and overall fitness.  A walking program can help you start slowly and gradually build endurance as you go.  Everyone's ability is different, so each person's starting point will be different.  You do not have to follow them exactly.  The are just samples. You should simply find out what's right for you and stick to that program.   In the beginning, you'll start off walking 2-3 times a day for short distances.  As you get stronger, you'll be walking further at just 1-2 times per day.  A. You Can Walk For A Certain Length Of Time Each Day    Walk 5 minutes 3 times per day.  Increase 2 minutes every 2 days (3 times per day).  Work up to 25-30 minutes (1-2 times per day).   Example:   Day 1-2 5 minutes 3 times per day   Day 7-8 12 minutes 2-3 times per day   Day 13-14 25 minutes 1-2 times per day  B. You Can Walk For a Certain Distance Each Day     Distance can be substituted for time.    Example:   3 trips to mailbox (at road)   3 trips to corner of block   3 trips around the block  C. Go to local [employer] and use the track.    Walk for distance ____ around track  Or time _5___ minutes (3x a day)  D. Walk _X__ Jog ____ Run ___  Please only do the exercises that your therapist has initialed and dated

## 2016-05-28 NOTE — Therapy (Signed)
[place] MAIN [facility] SERVICES [address] [place], [place], [address] Phone: [phone]   Fax:  [phone]  Physical Therapy Treatment  Patient Details  Name: Samantha Garcia MRN: [id number] Date of Birth: 1942-11-09 Referring Provider: Joan Mayans MD  Encounter Date: 05/28/2016      PT End of Session - 05/28/16 1636    Visit Number 2   Number of Visits 13   Date for PT Re-Evaluation 07/07/16   Authorization Type gcode 2   Authorization Time Period 10   PT Start Time 1430   PT Stop Time 1515   PT Time Calculation (min) 45 min   Activity Tolerance Patient tolerated treatment well;No increased pain   Behavior During Therapy WFL for tasks assessed/performed      Past Medical History:  Diagnosis Date  . Arthritis    B Knee  . Cancer ([place])    Left Breast  . Complication of anesthesia    Pt stated it took her a long time to wake up after having Hysterectomy in the 70s, but has not had any issues since  . Dysrhythmia    Atrial Fibrillation  . Hypertension    controlled;   . PPD positive 1968    Past Surgical History:  Procedure Laterality Date  . ABDOMINAL HYSTERECTOMY  09/20/75  . APPENDECTOMY    . BACK SURGERY  03/12/10  . BREAST LUMPECTOMY Left   . BREAST SURGERY Left 11/14/10   DCIS carcinoma InSitu   . CARDIAC CATHETERIZATION    . CATARACT EXTRACTION Bilateral   . COLONOSCOPY W/ POLYPECTOMY    . COLPORRHAPHY  02/15/01   Anterior repair   . HARDWARE REMOVAL N/A 09/28/2015   Procedure: REMOVAL OF RIGHT PEDICLE SCREWS AND ROD L3, L4, L5 Wiltse approach;  Surgeon: Jessy Oto, MD;  Location: [place];  Service: Orthopedics;  Laterality: N/A;  . ROTATOR CUFF REPAIR Left 06/15/08    There were no vitals filed for this visit.      Subjective Assessment - 05/28/16 1443    Subjective Patient reports about a 4-5/10 LLE knee discomfort; She reports having increased discomfor today after going up/down stairs at home. She reports  "overdoing it."    Pertinent History personal factors affecting rehab include: Progressive OA, lives in 2 story home, hx of CA, multiple surgeries to back,    Limitations Standing;Other (comment)  transfers;    How long can you sit comfortably? NA   How long can you stand comfortably? 15 min   How long can you walk comfortably? once she gets going she feels she can walk a fair distance;    Patient Stated Goals be able to climb stairs with less pain; be able to move around easier; avoid surgery;    Currently in Pain? Yes   Pain Score 5    Pain Location Knee   Pain Orientation Left   Pain Descriptors / Indicators Aching;Sore   Pain Type Chronic pain   Pain Onset More than a month ago          TREATMENT: SAQ with small bolster x10 bilaterally Quad sets 3 sec hold x10 bilaterally; Patient required min Vcs to isolate just quad muscle activation rather than hip flexors/gluteals;  SLR flexion x10 bilaterally; Patient required min VCs for positioning to reduce discomfort;  Seated: PT performed gentle knee joint distraction 5 sec hold, 5 sec rest x5 each; Grade I-II AP/PA mobs to each knee 10 sec bouts x3 each; Patient  able to perform better knee AROM with LAQ with less discomfort and less clicking following manual therapy;  Gait on treadmill 0.6-0.8 x3 min with mod VCs to increase step length, slow down LE steps cadence for better reciprocal gait pattern; Patient has difficulty improving posture and avoid forward trunk lean with gait on treadmill.  PT educated patient in home walking program. See patient instructions;                         PT Education - 05/28/16 1636    Education provided Yes   Education Details HEP advanced, walking program, LE strengthening   Person(s) Educated Patient   Methods Explanation;Verbal cues;Handout   Comprehension Verbalized understanding;Returned demonstration;Verbal cues required             PT Long Term Goals -  05/26/16 1312      PT LONG TERM GOAL #1   Title Patient will be independent in home exercise program to improve strength/mobility for better functional independence with ADLs.   Time 6   Period Weeks   Status New     PT LONG TERM GOAL #2   Title Patient (> 25 years old) will complete five times sit to stand test in < 20 seconds indicating an increased LE strength and improved balance.   Time 6   Period Weeks   Status New     PT LONG TERM GOAL #3   Title Patient will ascend/descend 4 stairs with rail assist mod- independently without loss of balance to improve ability to get in/out of home.    Time 6   Period Weeks   Status New     PT LONG TERM GOAL #4   Title Patient will increase lower extremity functional scale to >60/80 to demonstrate improved functional mobility and increased tolerance with ADLs.    Time 6   Period Weeks   Status New     PT LONG TERM GOAL #5   Title Patient will increase BLE gross strength to 4+/5 as to improve functional strength for independent gait, increased standing tolerance and increased ADL ability.   Time 6   Period Weeks   Status New               Plan - 05/28/16 1636    Clinical Impression Statement Patient instructed in LE strengthening. She had increased difficulty with LLE quad set. Patient also reports slight increase in pain with initial LLE knee AROM flexion/extension. However following manual therapy patient able to demonstrate better LLE knee AROM with less pain and less clicking. She was educated in home exercise program including walking program to improve mobility; She would benefit from additional skilled PT intervention to improve strength and reduce knee pain;    Rehab Potential Fair   Clinical Impairments Affecting Rehab Potential positive: good PLOF; negative: chronic condition, progressive OA, age, fall risk; Patient's clinical presentation is evolving as OA is a progressive condition and she has severe OA in BLE knee;     PT Frequency 2x / week   PT Duration 6 weeks   PT Treatment/Interventions Aquatic Therapy;Cryotherapy;Gait training;Moist Heat;Stair training;Functional mobility training;Therapeutic activities;Therapeutic exercise;Balance training;Neuromuscular re-education;Manual techniques;Patient/family education;Taping;Dry needling   PT Next Visit Plan advance HEP, work on strengthening, weight bearing activities   PT Home Exercise Plan advanced; see patient instructions   Consulted and Agree with Plan of Care Patient      Patient will benefit from skilled therapeutic intervention in order to improve  the following deficits and impairments:  Pain, Decreased mobility, Decreased activity tolerance, Decreased endurance, Decreased strength, Impaired flexibility, Decreased balance  Visit Diagnosis: Chronic pain of left knee  Chronic pain of right knee  Muscle weakness (generalized)     Problem List Patient Active Problem List   Diagnosis Date Noted  . Right lumbar radiculopathy 09/28/2015    Class: Chronic  . Painful orthopaedic hardware ([place]) 09/28/2015    Class: Chronic    Trotter,Margaret PT, DPT 05/28/2016, 4:45 PM  [place] MAIN [facility] SERVICES [address] [place], [place], [address] Phone: [phone]   Fax:  [phone]  Name: TRENIYAH SPRATLING MRN: [id number] Date of Birth: 1931-11-14

## 2016-06-02 ENCOUNTER — Ambulatory Visit: Payer: PPO | Admitting: Physical Therapy

## 2016-06-04 ENCOUNTER — Ambulatory Visit: Payer: PPO | Admitting: Physical Therapy

## 2016-06-04 ENCOUNTER — Encounter: Payer: Self-pay | Admitting: Physical Therapy

## 2016-06-04 DIAGNOSIS — G8929 Other chronic pain: Secondary | ICD-10-CM

## 2016-06-04 DIAGNOSIS — M25562 Pain in left knee: Principal | ICD-10-CM

## 2016-06-04 DIAGNOSIS — M6281 Muscle weakness (generalized): Secondary | ICD-10-CM

## 2016-06-04 DIAGNOSIS — M25561 Pain in right knee: Secondary | ICD-10-CM

## 2016-06-04 NOTE — Therapy (Signed)
[place] MAIN [facility] SERVICES [address] [place], [place], [address] Phone: [phone]   Fax:  [phone]  Physical Therapy Treatment  Patient Details  Name: Samantha Garcia MRN: [id number] Date of Birth: Oct 01, 1931 Referring Provider: Joan Mayans MD  Encounter Date: 06/04/2016      PT End of Session - 06/04/16 1020    Visit Number 3   Number of Visits 13   Date for PT Re-Evaluation 07/07/16   Authorization Type gcode 3   Authorization Time Period 10   PT Start Time 1010   PT Stop Time 1100   PT Time Calculation (min) 50 min   Activity Tolerance Patient tolerated treatment well;No increased pain   Behavior During Therapy WFL for tasks assessed/performed      Past Medical History:  Diagnosis Date  . Arthritis    B Knee  . Cancer ([place])    Left Breast  . Complication of anesthesia    Pt stated it took her a long time to wake up after having Hysterectomy in the 70s, but has not had any issues since  . Dysrhythmia    Atrial Fibrillation  . Hypertension    controlled;   . PPD positive 1968    Past Surgical History:  Procedure Laterality Date  . ABDOMINAL HYSTERECTOMY  09/20/75  . APPENDECTOMY    . BACK SURGERY  03/12/10  . BREAST LUMPECTOMY Left   . BREAST SURGERY Left 11/14/10   DCIS carcinoma InSitu   . CARDIAC CATHETERIZATION    . CATARACT EXTRACTION Bilateral   . COLONOSCOPY W/ POLYPECTOMY    . COLPORRHAPHY  02/15/01   Anterior repair   . HARDWARE REMOVAL N/A 09/28/2015   Procedure: REMOVAL OF RIGHT PEDICLE SCREWS AND ROD L3, L4, L5 Wiltse approach;  Surgeon: Jessy Oto, MD;  Location: [place];  Service: Orthopedics;  Laterality: N/A;  . ROTATOR CUFF REPAIR Left 06/15/08    There were no vitals filed for this visit.      Subjective Assessment - 06/04/16 1017    Subjective Patient reports being sick lately. She states, "I feel a lot better than I did last week." She reports having a good birthday but having trouble  getting up from the booth due to knee pain; She reports increased back discomfort today as compared to LE knees;    Pertinent History personal factors affecting rehab include: Progressive OA, lives in 2 story home, hx of CA, multiple surgeries to back,    Limitations Standing;Other (comment)  transfers;    How long can you sit comfortably? NA   How long can you stand comfortably? 15 min   How long can you walk comfortably? once she gets going she feels she can walk a fair distance;    Patient Stated Goals be able to climb stairs with less pain; be able to move around easier; avoid surgery;    Currently in Pain? Yes   Pain Score 5    Pain Location Back   Pain Orientation Lower   Pain Descriptors / Indicators Aching;Sore   Pain Type Chronic pain   Pain Onset More than a month ago          TREATMENT: Moist heat applied to BLE knee in supine x5 min (unbilled);  SAQ with small bolster x10 bilaterally, required min VCs to avoid excessive LLE knee extension to reduce discomfort when eccentrically lowering into flexion; Quad sets 3 sec hold x10 bilaterally; Patient required min Vcs to isolate  just quad muscle activation rather than hip gluteals;  Heel slides x10 bilaterally; SLR flexion x10 bilaterally; Patient required min VCs for positioning to reduce discomfort; Sidelying: Clamshells red tband 2x10 bilaterally with min VCs for positioning to improve strengthening;   Gait on treadmill 1.0-1.5 mph x5.5 min with 3 standing rest breaks, with mod VCs to increase step length, slow down LE steps cadence for better reciprocal gait pattern; Patient able to demonstrate better reciprocal gait pattern with less UE weight bearing and better postural control;   Standing: Heel/toe raises x15 with min Vcs for postural control; High knee march x15 bilaterally with rail assist for balance; required min VCs to avoid excessive knee flexion;  Terminal knee extension red tband x12 bilaterally with min  VCs and demonstration for correct technique and to isolate knee extension;                         PT Education - 06/04/16 1019    Education provided Yes   Education Details LE strengthening, ROM   Person(s) Educated Patient   Methods Explanation;Verbal cues   Comprehension Verbalized understanding;Returned demonstration;Verbal cues required             PT Long Term Goals - 05/26/16 1312      PT LONG TERM GOAL #1   Title Patient will be independent in home exercise program to improve strength/mobility for better functional independence with ADLs.   Time 6   Period Weeks   Status New     PT LONG TERM GOAL #2   Title Patient (> 46 years old) will complete five times sit to stand test in < 20 seconds indicating an increased LE strength and improved balance.   Time 6   Period Weeks   Status New     PT LONG TERM GOAL #3   Title Patient will ascend/descend 4 stairs with rail assist mod- independently without loss of balance to improve ability to get in/out of home.    Time 6   Period Weeks   Status New     PT LONG TERM GOAL #4   Title Patient will increase lower extremity functional scale to >60/80 to demonstrate improved functional mobility and increased tolerance with ADLs.    Time 6   Period Weeks   Status New     PT LONG TERM GOAL #5   Title Patient will increase BLE gross strength to 4+/5 as to improve functional strength for independent gait, increased standing tolerance and increased ADL ability.   Time 6   Period Weeks   Status New               Plan - 06/04/16 1059    Clinical Impression Statement Patient instructed in LE strengthening. PT applied moist heat prior to session which seemed to help reduce discomfort. Patient was able to tolerate session well without increase in pain and demonstrate better ROM with exercise. She would benefit from additional skilled PT intervention to improve strength, balance and gait safety;    Rehab  Potential Fair   Clinical Impairments Affecting Rehab Potential positive: good PLOF; negative: chronic condition, progressive OA, age, fall risk; Patient's clinical presentation is evolving as OA is a progressive condition and she has severe OA in BLE knee;    PT Frequency 2x / week   PT Duration 6 weeks   PT Treatment/Interventions Aquatic Therapy;Cryotherapy;Gait training;Moist Heat;Stair training;Functional mobility training;Therapeutic activities;Therapeutic exercise;Balance training;Neuromuscular re-education;Manual techniques;Patient/family education;Taping;Dry needling  PT Next Visit Plan advance HEP, work on strengthening, weight bearing activities   PT Home Exercise Plan advanced; see patient instructions   Consulted and Agree with Plan of Care Patient      Patient will benefit from skilled therapeutic intervention in order to improve the following deficits and impairments:  Pain, Decreased mobility, Decreased activity tolerance, Decreased endurance, Decreased strength, Impaired flexibility, Decreased balance  Visit Diagnosis: Chronic pain of left knee  Chronic pain of right knee  Muscle weakness (generalized)     Problem List Patient Active Problem List   Diagnosis Date Noted  . Right lumbar radiculopathy 09/28/2015    Class: Chronic  . Painful orthopaedic hardware ([place]) 09/28/2015    Class: Chronic    Talynn Lebon PT, DPT 06/04/2016, 11:00 AM  [place] MAIN [facility] SERVICES [address] [place], [place], [address] Phone: [phone]   Fax:  [phone]  Name: Samantha Garcia MRN: [id number] Date of Birth: 03-Sep-1931

## 2016-06-09 ENCOUNTER — Ambulatory Visit: Payer: PPO

## 2016-06-09 DIAGNOSIS — M25562 Pain in left knee: Principal | ICD-10-CM

## 2016-06-09 DIAGNOSIS — G8929 Other chronic pain: Secondary | ICD-10-CM

## 2016-06-09 DIAGNOSIS — M6281 Muscle weakness (generalized): Secondary | ICD-10-CM

## 2016-06-09 DIAGNOSIS — M25561 Pain in right knee: Secondary | ICD-10-CM

## 2016-06-09 NOTE — Therapy (Signed)
[place] MAIN [facility] SERVICES [address] [place], [place], [address] Phone: [phone]   Fax:  [phone]  Physical Therapy Treatment  Patient Details  Name: Samantha Garcia MRN: [id number] Date of Birth: 20-Nov-1931 Referring Provider: Joan Mayans MD  Encounter Date: 06/09/2016      PT End of Session - 06/09/16 1500    Visit Number 4   Number of Visits 13   Date for PT Re-Evaluation 07/07/16   Authorization Type gcode 4   Authorization Time Period 10   PT Start Time 1315   PT Stop Time 1345   PT Time Calculation (min) 30 min   Activity Tolerance Patient tolerated treatment well;No increased pain   Behavior During Therapy WFL for tasks assessed/performed      Past Medical History:  Diagnosis Date  . Arthritis    B Knee  . Cancer ([place])    Left Breast  . Complication of anesthesia    Pt stated it took her a long time to wake up after having Hysterectomy in the 70s, but has not had any issues since  . Dysrhythmia    Atrial Fibrillation  . Hypertension    controlled;   . PPD positive 1968    Past Surgical History:  Procedure Laterality Date  . ABDOMINAL HYSTERECTOMY  09/20/75  . APPENDECTOMY    . BACK SURGERY  03/12/10  . BREAST LUMPECTOMY Left   . BREAST SURGERY Left 11/14/10   DCIS carcinoma InSitu   . CARDIAC CATHETERIZATION    . CATARACT EXTRACTION Bilateral   . COLONOSCOPY W/ POLYPECTOMY    . COLPORRHAPHY  02/15/01   Anterior repair   . HARDWARE REMOVAL N/A 09/28/2015   Procedure: REMOVAL OF RIGHT PEDICLE SCREWS AND ROD L3, L4, L5 Wiltse approach;  Surgeon: Jessy Oto, MD;  Location: [place];  Service: Orthopedics;  Laterality: N/A;  . ROTATOR CUFF REPAIR Left 06/15/08    There were no vitals filed for this visit.      Subjective Assessment - 06/09/16 1459    Subjective Patient reports increased knee pain today reporting they've been feeling sore because of the cold weather.    Pertinent History personal  factors affecting rehab include: Progressive OA, lives in 2 story home, hx of CA, multiple surgeries to back,    Limitations Standing;Other (comment)  transfers;    How long can you sit comfortably? NA   How long can you stand comfortably? 15 min   How long can you walk comfortably? once she gets going she feels she can walk a fair distance;    Patient Stated Goals be able to climb stairs with less pain; be able to move around easier; avoid surgery;    Currently in Pain? Yes   Pain Score 8    Pain Location Knee   Pain Orientation Right;Left   Pain Descriptors / Indicators Aching;Sore   Pain Type Chronic pain   Pain Onset More than a month ago        TREATMENT: Manual Therapy  Distraction to the knee in sitting to decrease pain and encourage relaxation- 3 x 30sec Therapeutic Exercise: SAQ with small ball  x10 bilaterally, required min VCs to avoid excessive LLE knee extension to reduce discomfort when eccentrically lowering into flexion; Knees to chest with physioball - 2 min SLR flexion x10 bilaterally; Patient required min VCs for positioning to reduce discomfort; Sidelying: Hip Abduction --  2x10 bilaterally with min VCs for positioning to improve strengthening;  LAQ in sitting - x 10 with manual assistance  with cueing on relaxing knee muscular and manually assisted at end range            PT Education - 06/09/16 1500    Education provided Yes   Education Details HEP: focus of relaxation of knees, and performing pain free ROM in sitting   Person(s) Educated Patient   Methods Explanation;Demonstration   Comprehension Verbalized understanding;Returned demonstration             PT Long Term Goals - 05/26/16 1312      PT LONG TERM GOAL #1   Title Patient will be independent in home exercise program to improve strength/mobility for better functional independence with ADLs.   Time 6   Period Weeks   Status New     PT LONG TERM GOAL #2   Title Patient (> 80 years  old) will complete five times sit to stand test in < 20 seconds indicating an increased LE strength and improved balance.   Time 6   Period Weeks   Status New     PT LONG TERM GOAL #3   Title Patient will ascend/descend 4 stairs with rail assist mod- independently without loss of balance to improve ability to get in/out of home.    Time 6   Period Weeks   Status New     PT LONG TERM GOAL #4   Title Patient will increase lower extremity functional scale to >60/80 to demonstrate improved functional mobility and increased tolerance with ADLs.    Time 6   Period Weeks   Status New     PT LONG TERM GOAL #5   Title Patient will increase BLE gross strength to 4+/5 as to improve functional strength for independent gait, increased standing tolerance and increased ADL ability.   Time 6   Period Weeks   Status New               Plan - 06/09/16 1501    Clinical Impression Statement Instructed and educated on importance of performing pain free knee AROM to maintaining decrease in pain and guarding. Patient demonstrates improvement of knee pain after performing AROM and strengthening exercises with the knee straight. Patient will benefit from further skilled therapy focused on improving strength and progressing to standing strengthening exercise to return to prior level of function.    Rehab Potential Fair   Clinical Impairments Affecting Rehab Potential positive: good PLOF; negative: chronic condition, progressive OA, age, fall risk; Patient's clinical presentation is evolving as OA is a progressive condition and she has severe OA in BLE knee;    PT Frequency 2x / week   PT Duration 6 weeks   PT Treatment/Interventions Aquatic Therapy;Cryotherapy;Gait training;Moist Heat;Stair training;Functional mobility training;Therapeutic activities;Therapeutic exercise;Balance training;Neuromuscular re-education;Manual techniques;Patient/family education;Taping;Dry needling   PT Next Visit Plan advance  HEP, work on strengthening, weight bearing activities   PT Home Exercise Plan advanced; see patient instructions   Consulted and Agree with Plan of Care Patient      Patient will benefit from skilled therapeutic intervention in order to improve the following deficits and impairments:  Pain, Decreased mobility, Decreased activity tolerance, Decreased endurance, Decreased strength, Impaired flexibility, Decreased balance  Visit Diagnosis: Chronic pain of left knee  Chronic pain of right knee  Muscle weakness (generalized)     Problem List Patient Active Problem List   Diagnosis Date Noted  . Right lumbar radiculopathy 09/28/2015    Class: Chronic  . Painful  orthopaedic hardware ([place]) 09/28/2015    Class: Chronic    Blythe Stanford, PT DPT 06/09/2016, 3:07 PM  [place] MAIN [facility] SERVICES [address] [place], [place], [address] Phone: [phone]   Fax:  [phone]  Name: ARIABELLA KORNBLUM MRN: [id number] Date of Birth: [birth year]

## 2016-06-20 ENCOUNTER — Ambulatory Visit: Payer: PPO

## 2016-06-26 ENCOUNTER — Encounter: Payer: Self-pay | Admitting: Physical Therapy

## 2016-06-26 ENCOUNTER — Ambulatory Visit: Payer: PPO | Attending: Physical Medicine & Rehabilitation | Admitting: Physical Therapy

## 2016-06-26 DIAGNOSIS — M25561 Pain in right knee: Secondary | ICD-10-CM | POA: Diagnosis not present

## 2016-06-26 DIAGNOSIS — M25562 Pain in left knee: Secondary | ICD-10-CM | POA: Insufficient documentation

## 2016-06-26 DIAGNOSIS — M6281 Muscle weakness (generalized): Secondary | ICD-10-CM | POA: Diagnosis not present

## 2016-06-26 DIAGNOSIS — G8929 Other chronic pain: Secondary | ICD-10-CM | POA: Insufficient documentation

## 2016-06-26 NOTE — Therapy (Signed)
[name] [facility] SERVICES [address] [place], [place], [address] Phone: [phone]   Fax:  [phone]  Physical Therapy Treatment  Patient Details  Name: Samantha Garcia MRN: [id number] Date of Birth: [birth year] Referring Provider: Joan Mayans MD  Encounter Date: 06/26/2016      PT End of Session - 06/26/16 1304    Visit Number 5   Number of Visits 13   Date for PT Re-Evaluation 07/07/16   Authorization Type gcode 5   Authorization Time Period 10   PT Start Time 1255   PT Stop Time 1345   PT Time Calculation (min) 50 min   Activity Tolerance Patient tolerated treatment well;No increased pain   Behavior During Therapy WFL for tasks assessed/performed      Past Medical History:  Diagnosis Date  . Arthritis    B Knee  . Cancer ([place])    Left Breast  . Complication of anesthesia    Pt stated it took her a long time to wake up after having Hysterectomy in the 70s, but has not had any issues since  . Dysrhythmia    Atrial Fibrillation  . Hypertension    controlled;   . PPD positive 1968    Past Surgical History:  Procedure Laterality Date  . ABDOMINAL HYSTERECTOMY  09/20/75  . APPENDECTOMY    . BACK SURGERY  03/12/10  . BREAST LUMPECTOMY Left   . BREAST SURGERY Left 11/14/10   DCIS carcinoma InSitu   . CARDIAC CATHETERIZATION    . CATARACT EXTRACTION Bilateral   . COLONOSCOPY W/ POLYPECTOMY    . COLPORRHAPHY  02/15/01   Anterior repair   . HARDWARE REMOVAL N/A 09/28/2015   Procedure: REMOVAL OF RIGHT PEDICLE SCREWS AND ROD L3, L4, L5 Wiltse approach;  Surgeon: Jessy Oto, MD;  Location: [place];  Service: Orthopedics;  Laterality: N/A;  . ROTATOR CUFF REPAIR Left 06/15/08    There were no vitals filed for this visit.      Subjective Assessment - 06/26/16 1302    Subjective Patient reports having a good visit with her family in [place]; She reports having increased groin pain in each LE with prolonged walking. She  reports that it took a few days for it to work itself out. I am still having a hard time getting up from a chair and I have more pain when trying to get up;    Pertinent History personal factors affecting rehab include: Progressive OA, lives in 2 story home, hx of CA, multiple surgeries to back,    Limitations Standing;Other (comment)  transfers;    How long can you sit comfortably? NA   How long can you stand comfortably? 15 min   How long can you walk comfortably? once she gets going she feels she can walk a fair distance;    Patient Stated Goals be able to climb stairs with less pain; be able to move around easier; avoid surgery;    Currently in Pain? Yes   Pain Score 4    Pain Location Knee   Pain Orientation Right;Left   Pain Descriptors / Indicators Aching;Sore   Pain Type Chronic pain   Pain Onset More than a month ago        TREATMENT: Moist heat applied to BLE knee in supine x5 min (unbilled);  SAQ with small bolster x10 bilaterally, required min VCs to avoid excessive LLE knee extension to reduce discomfort when eccentrically lowering into flexion; Quad sets  5 sec hold x10 bilaterally; Patient required min Vcs to isolate just quad muscle activation rather than hip gluteals;  SLR flexion x10 bilaterally; Patient required min VCs for positioning to reduce discomfort; Patient fatigued very quickly with SLR flexion; Bridges with arms by side x10 with min VCs for positioning; Sidelying: Clamshells red tband 2x15 bilaterally with min VCs for positioning to improve strengthening;   Re-attempted gait on treadmill however patient continues to have forward lean and has trouble with erect posture and taking longer steps without forward lean x3 min; Gait on firm surface x3 min (800 feet) with cues to increase step length and improve erect posture for better gait technique; Patient does exhibit 1 episode of toe drag and required min VCs for ankle DF for better foot  clearance;   Patient reports slight discomfort in right hip with walking;  Supine: Long axis distraction 15 sec hold, 5 sec rest x5 min; patient reports no pain afterward and reports feeling significantly better as compared to start of session;                           PT Education - 06/26/16 1304    Education provided Yes   Education Details reinforced HEP; ROM/strengthening   Person(s) Educated Patient   Methods Explanation;Demonstration;Verbal cues   Comprehension Verbalized understanding;Returned demonstration;Verbal cues required             PT Long Term Goals - 05/26/16 1312      PT LONG TERM GOAL #1   Title Patient will be independent in home exercise program to improve strength/mobility for better functional independence with ADLs.   Time 6   Period Weeks   Status New     PT LONG TERM GOAL #2   Title Patient (83 years old) will complete five times sit to stand test in < 20 seconds indicating an increased LE strength and improved balance.   Time 6   Period Weeks   Status New     PT LONG TERM GOAL #3   Title Patient will ascend/descend 4 stairs with rail assist mod- independently without loss of balance to improve ability to get in/out of home.    Time 6   Period Weeks   Status New     PT LONG TERM GOAL #4   Title Patient will increase lower extremity functional scale to >60/80 to demonstrate improved functional mobility and increased tolerance with ADLs.    Time 6   Period Weeks   Status New     PT LONG TERM GOAL #5   Title Patient will increase BLE gross strength to 4+/5 as to improve functional strength for independent gait, increased standing tolerance and increased ADL ability.   Time 6   Period Weeks   Status New               Plan - 06/26/16 1324    Clinical Impression Statement Patient instructed in BLE knee strenghtening and hip strengthening exercise. She was instructed to maintain a painfree ROM during all  exercise. Patient responds well to moist heat at start of session with less stiffness and less discomfort with movement; She would benefit from additional skilled PT intervention to improve strength, balance and gait safety;    Rehab Potential Fair   Clinical Impairments Affecting Rehab Potential positive: good PLOF; negative: chronic condition, progressive OA, age, fall risk; Patient's clinical presentation is evolving as OA is a progressive condition and  she has severe OA in BLE knee;    PT Frequency 2x / week   PT Duration 6 weeks   PT Treatment/Interventions Aquatic Therapy;Cryotherapy;Gait training;Moist Heat;Stair training;Functional mobility training;Therapeutic activities;Therapeutic exercise;Balance training;Neuromuscular re-education;Manual techniques;Patient/family education;Taping;Dry needling   PT Next Visit Plan advance HEP, work on strengthening, weight bearing activities   PT Home Exercise Plan advanced; see patient instructions   Consulted and Agree with Plan of Care Patient      Patient will benefit from skilled therapeutic intervention in order to improve the following deficits and impairments:  Pain, Decreased mobility, Decreased activity tolerance, Decreased endurance, Decreased strength, Impaired flexibility, Decreased balance  Visit Diagnosis: Chronic pain of left knee  Chronic pain of right knee  Muscle weakness (generalized)     Problem List Patient Active Problem List   Diagnosis Date Noted  . Right lumbar radiculopathy 09/28/2015    Class: Chronic  . Painful orthopaedic hardware ([place]) 09/28/2015    Class: Chronic    Uchechi Denison PT, DPT 06/26/2016, 1:48 PM  [place] MAIN [facility] SERVICES [address] [place], [place], [address] Phone: [phone]   Fax:  [phone]  Name: CAITIE DELINE MRN: [id number] Date of Birth: 04-27-1932

## 2016-06-30 ENCOUNTER — Ambulatory Visit: Payer: PPO

## 2016-06-30 DIAGNOSIS — M6281 Muscle weakness (generalized): Secondary | ICD-10-CM

## 2016-06-30 DIAGNOSIS — G8929 Other chronic pain: Secondary | ICD-10-CM

## 2016-06-30 DIAGNOSIS — M25562 Pain in left knee: Principal | ICD-10-CM

## 2016-06-30 DIAGNOSIS — M25561 Pain in right knee: Secondary | ICD-10-CM

## 2016-06-30 NOTE — Therapy (Signed)
[place] MAIN [facility] SERVICES [address] [place], [place], [address] Phone: [phone]   Fax:  [phone]  Physical Therapy Treatment  Patient Details  Name: Samantha Garcia MRN: [id number] Date of Birth: Jan 19, 1932 Referring Provider: Joan Mayans MD  Encounter Date: 06/30/2016      PT End of Session - 06/30/16 1236    Visit Number 6   Number of Visits 13   Date for PT Re-Evaluation 07/07/16   Authorization Type gcode 6   Authorization Time Period 10   PT Start Time 1100   PT Stop Time 1143   PT Time Calculation (min) 43 min   Activity Tolerance Patient tolerated treatment well;No increased pain   Behavior During Therapy WFL for tasks assessed/performed      Past Medical History:  Diagnosis Date  . Arthritis    B Knee  . Cancer ([place])    Left Breast  . Complication of anesthesia    Pt stated it took her a long time to wake up after having Hysterectomy in the 70s, but has not had any issues since  . Dysrhythmia    Atrial Fibrillation  . Hypertension    controlled;   . PPD positive 1968    Past Surgical History:  Procedure Laterality Date  . ABDOMINAL HYSTERECTOMY  09/20/75  . APPENDECTOMY    . BACK SURGERY  03/12/10  . BREAST LUMPECTOMY Left   . BREAST SURGERY Left 11/14/10   DCIS carcinoma InSitu   . CARDIAC CATHETERIZATION    . CATARACT EXTRACTION Bilateral   . COLONOSCOPY W/ POLYPECTOMY    . COLPORRHAPHY  02/15/01   Anterior repair   . HARDWARE REMOVAL N/A 09/28/2015   Procedure: REMOVAL OF RIGHT PEDICLE SCREWS AND ROD L3, L4, L5 Wiltse approach;  Surgeon: Jessy Oto, MD;  Location: [place];  Service: Orthopedics;  Laterality: N/A;  . ROTATOR CUFF REPAIR Left 06/15/08    There were no vitals filed for this visit.      Subjective Assessment - 06/30/16 1142    Subjective Patient reports she has increased difficulty with transferring from sitting to standing and performing the stairs. Reports minor pain in the hip  and knee today.    Pertinent History personal factors affecting rehab include: Progressive OA, lives in 2 story home, hx of CA, multiple surgeries to back,    Limitations Standing;Other (comment)  transfers;    How long can you sit comfortably? NA   How long can you stand comfortably? 15 min   How long can you walk comfortably? once she gets going she feels she can walk a fair distance;    Patient Stated Goals be able to climb stairs with less pain; be able to move around easier; avoid surgery;    Currently in Pain? Yes   Pain Score 2    Pain Location --  hip and knee   Pain Descriptors / Indicators Aching   Pain Type Chronic pain   Pain Onset More than a month ago      TREATMENT: Manual Therapy  Distraction to the knee in sitting to decrease pain and encourage relaxation- 3 x 30sec  Therapeutic Exercise: Seated ball roll outs - 2 min LAQ in sitting - x 10  Sit to stands with mina from PT - 2 x10 at raised table Seated ball squeeze/glute squeeze - 2 x 10  Nustep seat position 8 - level 2; 5 minutes        PT  Education - 06/30/16 1236    Education provided Yes   Education Details form/technique throughout exercise performance   Person(s) Educated Patient   Methods Explanation;Demonstration   Comprehension Returned demonstration;Verbalized understanding             PT Long Term Goals - 05/26/16 1312      PT LONG TERM GOAL #1   Title Patient will be independent in home exercise program to improve strength/mobility for better functional independence with ADLs.   Time 6   Period Weeks   Status New     PT LONG TERM GOAL #2   Title Patient (> 37 years old) will complete five times sit to stand test in < 20 seconds indicating an increased LE strength and improved balance.   Time 6   Period Weeks   Status New     PT LONG TERM GOAL #3   Title Patient will ascend/descend 4 stairs with rail assist mod- independently without loss of balance to improve ability to get  in/out of home.    Time 6   Period Weeks   Status New     PT LONG TERM GOAL #4   Title Patient will increase lower extremity functional scale to >60/80 to demonstrate improved functional mobility and increased tolerance with ADLs.    Time 6   Period Weeks   Status New     PT LONG TERM GOAL #5   Title Patient will increase BLE gross strength to 4+/5 as to improve functional strength for independent gait, increased standing tolerance and increased ADL ability.   Time 6   Period Weeks   Status New               Plan - 06/30/16 1237    Clinical Impression Statement Patient reports decreased knee symptoms after performing seated manual distraction and cueing on proper joint positioning throughout exercise. Patient reports decrease in pain after using the Nustep indicating improvement in motor coordination; although patient reports decrease in pain she continues with difficulty with performing sit to stand exercises and pt will benefit from further skilled therapy to return to prior level of function   Rehab Potential Fair   Clinical Impairments Affecting Rehab Potential positive: good PLOF; negative: chronic condition, progressive OA, age, fall risk; Patient's clinical presentation is evolving as OA is a progressive condition and she has severe OA in BLE knee;    PT Frequency 2x / week   PT Duration 6 weeks   PT Treatment/Interventions Aquatic Therapy;Cryotherapy;Gait training;Moist Heat;Stair training;Functional mobility training;Therapeutic activities;Therapeutic exercise;Balance training;Neuromuscular re-education;Manual techniques;Patient/family education;Taping;Dry needling   PT Next Visit Plan advance HEP, work on strengthening, weight bearing activities   PT Home Exercise Plan advanced; see patient instructions   Consulted and Agree with Plan of Care Patient      Patient will benefit from skilled therapeutic intervention in order to improve the following deficits and  impairments:  Pain, Decreased mobility, Decreased activity tolerance, Decreased endurance, Decreased strength, Impaired flexibility, Decreased balance  Visit Diagnosis: Chronic pain of left knee  Chronic pain of right knee  Muscle weakness (generalized)     Problem List Patient Active Problem List   Diagnosis Date Noted  . Right lumbar radiculopathy 09/28/2015    Class: Chronic  . Painful orthopaedic hardware ([place]) 09/28/2015    Class: Chronic    Blythe Stanford, PT DPT 06/30/2016, 12:43 PM  [place] MAIN [facility] SERVICES [address] [place], [place], [address] Phone: [phone]  Fax:  [phone]  Name: Samantha Garcia MRN: [id number] Date of Birth: Apr 17, 1932

## 2016-07-03 ENCOUNTER — Encounter: Payer: Self-pay | Admitting: Physical Therapy

## 2016-07-03 ENCOUNTER — Ambulatory Visit: Payer: PPO | Admitting: Physical Therapy

## 2016-07-03 DIAGNOSIS — M25561 Pain in right knee: Secondary | ICD-10-CM

## 2016-07-03 DIAGNOSIS — M25562 Pain in left knee: Principal | ICD-10-CM

## 2016-07-03 DIAGNOSIS — G8929 Other chronic pain: Secondary | ICD-10-CM

## 2016-07-03 DIAGNOSIS — M6281 Muscle weakness (generalized): Secondary | ICD-10-CM

## 2016-07-03 NOTE — Therapy (Signed)
[place] MAIN [facility] SERVICES [address] [place], [place], [address] Phone: [phone]   Fax:  [phone]  Physical Therapy Treatment  Patient Details  Name: Samantha Garcia MRN: [id number] Date of Birth: [birth year] Referring Provider: Joan Mayans MD  Encounter Date: 07/03/2016      PT End of Session - 07/03/16 1321    Visit Number 7   Number of Visits 13   Date for PT Re-Evaluation 07/07/16   Authorization Type gcode 7   Authorization Time Period 10   PT Start Time 1300   PT Stop Time 1345   PT Time Calculation (min) 45 min   Activity Tolerance Patient tolerated treatment well;No increased pain   Behavior During Therapy WFL for tasks assessed/performed      Past Medical History:  Diagnosis Date  . Arthritis    B Knee  . Cancer ([place])    Left Breast  . Complication of anesthesia    Pt stated it took her a long time to wake up after having Hysterectomy in the 70s, but has not had any issues since  . Dysrhythmia    Atrial Fibrillation  . Hypertension    controlled;   . PPD positive 1968    Past Surgical History:  Procedure Laterality Date  . ABDOMINAL HYSTERECTOMY  09/20/75  . APPENDECTOMY    . BACK SURGERY  03/12/10  . BREAST LUMPECTOMY Left   . BREAST SURGERY Left 11/14/10   DCIS carcinoma InSitu   . CARDIAC CATHETERIZATION    . CATARACT EXTRACTION Bilateral   . COLONOSCOPY W/ POLYPECTOMY    . COLPORRHAPHY  02/15/01   Anterior repair   . HARDWARE REMOVAL N/A 09/28/2015   Procedure: REMOVAL OF RIGHT PEDICLE SCREWS AND ROD L3, L4, L5 Wiltse approach;  Surgeon: Jessy Oto, MD;  Location: [place];  Service: Orthopedics;  Laterality: N/A;  . ROTATOR CUFF REPAIR Left 06/15/08    There were no vitals filed for this visit.      Subjective Assessment - 07/03/16 1313    Subjective Patient reports increased discomfort in back today as compared to previous sessions; She reports less knee discomfort; She reports walking more  and feels like she is getting some better;    Pertinent History personal factors affecting rehab include: Progressive OA, lives in 2 story home, hx of CA, multiple surgeries to back,    Limitations Standing;Other (comment)  transfers;    How long can you sit comfortably? NA   How long can you stand comfortably? 15 min   How long can you walk comfortably? once she gets going she feels she can walk a fair distance;    Patient Stated Goals be able to climb stairs with less pain; be able to move around easier; avoid surgery;    Currently in Pain? Yes   Pain Score 8    Pain Location Back   Pain Orientation Lower   Pain Descriptors / Indicators Aching;Sore   Pain Type Chronic pain   Pain Onset More than a month ago         TREATMENT: Moist heat applied to BLE knee in supine x5 min (unbilled);  SAQ with large bolster x10 bilaterally, required min VCs to avoid excessive LLE knee extension to reduce discomfort when eccentrically lowering into flexion; SLR flexion x10 bilaterally; Patient required min VCs for positioning to reduce discomfort; Patient fatigued very quickly with SLR flexion; Bridges with arms by side 2x10 with min VCs for positioning;  Sidelying: Clamshells green tband 2x10 bilaterally with min VCs for positioning to improve strengthening;  Seated: Manual therapy: gentle knee distraction 15 sec hold x3 RLE, 15 sec hold x6 LLE; Patient reports no pain with LAQ in sitting following manual therapy (3 min)    Sit<>Stand from elevated seat (mini sit to stand) with arms on thighs x5 reps (patient reports no pain);  Leg press: BLE plate 60# [id number] with cues to slow down eccentric motion for better quad control; Patient denies any pain with leg press; She was able to demonstrate better ROM with less discomfort;     Finished with Nustep BUE/BLE level 2 x5 min concurrent with moist heat to low back with cues to maintain steps per minute >50 for better cardiovascular endurance (unbilled);                       PT Education - 07/03/16 1320    Education provided Yes   Education Details strengthening/ROM; exercise technique; reinforced HEP   Person(s) Educated Patient   Methods Explanation;Verbal cues   Comprehension Verbalized understanding;Returned demonstration;Verbal cues required             PT Long Term Goals - 05/26/16 1312      PT LONG TERM GOAL #1   Title Patient will be independent in home exercise program to improve strength/mobility for better functional independence with ADLs.   Time 6   Period Weeks   Status New     PT LONG TERM GOAL #2   Title Patient (> 1 years old) will complete five times sit to stand test in < 20 seconds indicating an increased LE strength and improved balance.   Time 6   Period Weeks   Status New     PT LONG TERM GOAL #3   Title Patient will ascend/descend 4 stairs with rail assist mod- independently without loss of balance to improve ability to get in/out of home.    Time 6   Period Weeks   Status New     PT LONG TERM GOAL #4   Title Patient will increase lower extremity functional scale to >60/80 to demonstrate improved functional mobility and increased tolerance with ADLs.    Time 6   Period Weeks   Status New     PT LONG TERM GOAL #5   Title Patient will increase BLE gross strength to 4+/5 as to improve functional strength for independent gait, increased standing tolerance and increased ADL ability.   Time 6   Period Weeks   Status New               Plan - 07/03/16 1401    Clinical Impression Statement Patient responds well to moist heat and manual therapy with less knee discomfort. She reports no pain with AROM following manual knee joint distraction; Instructed patient in leg press with patient being able to demonstrate better ROM and less discomfort. Advanced LE strengthening today with no report of increase in pain. patient would benefit from additional skilled PT intervention to  improve strength and reduce knee pain;    Rehab Potential Fair   Clinical Impairments Affecting Rehab Potential positive: good PLOF; negative: chronic condition, progressive OA, age, fall risk; Patient's clinical presentation is evolving as OA is a progressive condition and she has severe OA in BLE knee;    PT Frequency 2x / week   PT Duration 6 weeks   PT Treatment/Interventions Aquatic Therapy;Cryotherapy;Gait training;Moist Heat;Stair training;Functional mobility training;Therapeutic activities;Therapeutic  exercise;Balance training;Neuromuscular re-education;Manual techniques;Patient/family education;Taping;Dry needling   PT Next Visit Plan advance HEP, work on strengthening, weight bearing activities   PT Home Exercise Plan advanced; see patient instructions   Consulted and Agree with Plan of Care Patient      Patient will benefit from skilled therapeutic intervention in order to improve the following deficits and impairments:  Pain, Decreased mobility, Decreased activity tolerance, Decreased endurance, Decreased strength, Impaired flexibility, Decreased balance  Visit Diagnosis: Chronic pain of left knee  Chronic pain of right knee  Muscle weakness (generalized)     Problem List Patient Active Problem List   Diagnosis Date Noted  . Right lumbar radiculopathy 09/28/2015    Class: Chronic  . Painful orthopaedic hardware ([place]) 09/28/2015    Class: Chronic    Trotter,Margaret PT, DPT 07/03/2016, 2:05 PM  [place] MAIN [facility] SERVICES [address] [place], [place], [address] Phone: [phone]   Fax:  [phone]  Name: Samantha Garcia MRN: [id number] Date of Birth: [birth year]

## 2016-07-08 ENCOUNTER — Ambulatory Visit: Payer: PPO | Admitting: Physical Therapy

## 2016-07-08 ENCOUNTER — Encounter: Payer: Self-pay | Admitting: Physical Therapy

## 2016-07-08 DIAGNOSIS — M6281 Muscle weakness (generalized): Secondary | ICD-10-CM

## 2016-07-08 DIAGNOSIS — G8929 Other chronic pain: Secondary | ICD-10-CM

## 2016-07-08 DIAGNOSIS — M25562 Pain in left knee: Secondary | ICD-10-CM | POA: Diagnosis not present

## 2016-07-08 DIAGNOSIS — M25561 Pain in right knee: Secondary | ICD-10-CM

## 2016-07-08 NOTE — Therapy (Signed)
[place] MAIN [facility] SERVICES [address] [place], [place], [address] Phone: [phone]   Fax:  [phone]  Physical Therapy Treatment  Patient Details  Name: Samantha Garcia MRN: [id number] Date of Birth: 09/15/1931 Referring Provider: Joan Mayans MD  Encounter Date: 07/08/2016      PT End of Session - 07/08/16 1330    Visit Number 8   Number of Visits 21   Date for PT Re-Evaluation 08/05/16   Authorization Type gcode 8   Authorization Time Period 10   PT Start Time 1305   PT Stop Time 1350   PT Time Calculation (min) 45 min   Activity Tolerance Patient tolerated treatment well;No increased pain   Behavior During Therapy WFL for tasks assessed/performed      Past Medical History:  Diagnosis Date  . Arthritis    B Knee  . Cancer ([place])    Left Breast  . Complication of anesthesia    Pt stated it took her a long time to wake up after having Hysterectomy in the 70s, but has not had any issues since  . Dysrhythmia    Atrial Fibrillation  . Hypertension    controlled;   . PPD positive 1968    Past Surgical History:  Procedure Laterality Date  . ABDOMINAL HYSTERECTOMY  09/20/75  . APPENDECTOMY    . BACK SURGERY  03/12/10  . BREAST LUMPECTOMY Left   . BREAST SURGERY Left 11/14/10   DCIS carcinoma InSitu   . CARDIAC CATHETERIZATION    . CATARACT EXTRACTION Bilateral   . COLONOSCOPY W/ POLYPECTOMY    . COLPORRHAPHY  02/15/01   Anterior repair   . HARDWARE REMOVAL N/A 09/28/2015   Procedure: REMOVAL OF RIGHT PEDICLE SCREWS AND ROD L3, L4, L5 Wiltse approach;  Surgeon: Jessy Oto, MD;  Location: [place];  Service: Orthopedics;  Laterality: N/A;  . ROTATOR CUFF REPAIR Left 06/15/08    There were no vitals filed for this visit.      Subjective Assessment - 07/08/16 1320    Subjective Patient reports slight increase in back pain today; She reports taking a pain pill this morning to help; She reports having less knee discomfort  overall and reports that she has been walking more.    Pertinent History personal factors affecting rehab include: Progressive OA, lives in 2 story home, hx of CA, multiple surgeries to back,    Limitations Standing;Other (comment)  transfers;    How long can you sit comfortably? NA   How long can you stand comfortably? 15 min   How long can you walk comfortably? once she gets going she feels she can walk a fair distance;    Patient Stated Goals be able to climb stairs with less pain; be able to move around easier; avoid surgery;    Currently in Pain? Yes   Pain Score 1    Pain Location Back   Pain Orientation Lower   Pain Descriptors / Indicators Aching;Sore   Pain Type Chronic pain   Pain Onset More than a month ago            [facility] PT Assessment - 07/08/16 0001      Observation/Other Assessments   Lower Extremity Functional Scale  56/80 (the lower the score the greater the disability; improved from 05/26/16 which was 39/80)     Strength   Right Hip Flexion 4+/5   Right Hip ABduction 4/5   Right Hip ADduction 4/5  Left Hip Flexion 4+/5   Left Hip ABduction 4/5   Left Hip ADduction 4/5   Right Knee Flexion 4/5   Right Knee Extension 4-/5   Left Knee Flexion 4/5   Left Knee Extension 4-/5   Right Ankle Dorsiflexion 4/5   Right Ankle Plantar Flexion 4/5   Left Ankle Dorsiflexion 4/5   Left Ankle Plantar Flexion 4/5     Standardized Balance Assessment   Five times sit to stand comments  30 with pushing with 1 HHA (>15 sec indicates increased fall risk) improved from 05/26/16 which was 48 sec pushing with BUE;      TREATMENT: PT applied moist heat to BLE knee in hooklying position x5 min (unbilled);  Patient instructed in 5 times sit<>Stand, assessed strength, LEFs, and assessed stair negotiation; PT address goals; she required min VCs for correct activity; Patient ascend/descend 4 steps with B rail assist x2 with cues to advance foot forward to edge of step prior to  stepping down for better ease of descending steps; Patient able to negotiate steps reciprocally mod I without pain but reports still having trouble with steps at home.   Standing with red tband around both legs: Hip abduction x10 bilaterally; Hip extension x10 bilaterally; Side stepping 10 feet x2 laps each direction;   Heel/toe raises x15 with rail assist for safety;  Patient required min-moderate verbal/tactile cues for correct exercise technique including cues to avoid trunk lean and reduce UE weight bearing for better LE strengthening; Patient denies any increase in pain at end of session;                         PT Education - 07/08/16 1330    Education provided Yes   Education Details strengthening, progress towards goals, recommendations, reinforced HEP   Person(s) Educated Patient   Methods Explanation;Verbal cues   Comprehension Verbalized understanding;Returned demonstration;Verbal cues required             PT Long Term Goals - 07/08/16 1322      PT LONG TERM GOAL #1   Title Patient will be independent in home exercise program to improve strength/mobility for better functional independence with ADLs.   Time 4   Period Weeks   Status On-going     PT LONG TERM GOAL #2   Title Patient (83 years old) will complete five times sit to stand test in < 20 seconds indicating an increased LE strength and improved balance.   Time 4   Period Weeks   Status Partially Met     PT LONG TERM GOAL #3   Title Patient will ascend/descend 4 stairs with rail assist mod- independently without loss of balance to improve ability to get in/out of home.    Time 4   Period Weeks   Status Partially Met     PT LONG TERM GOAL #4   Title Patient will increase lower extremity functional scale to >60/80 to demonstrate improved functional mobility and increased tolerance with ADLs.    Time 4   Period Weeks   Status Partially Met     PT LONG TERM GOAL #5   Title Patient  will increase BLE gross strength to 4+/5 as to improve functional strength for independent gait, increased standing tolerance and increased ADL ability.   Time 4   Period Weeks   Status Partially Met               Plan - 07/08/16  1355    Clinical Impression Statement Patient instructed in outcome measures to determine progress towards goals. She demonstrates improved functional mobility with better 5 times sit<>stand and stair negotiation ability; She also reports self perceived improvement with better LEFs score. Patient instructed in advanced strengthening in standing with tband resistance. she requires min VCs to improve trunk support and posture for better strengthening; She would benefit from additional skilled PT intervention to improve strength, balance and gait safety and reduce knee pain;    Rehab Potential Fair   Clinical Impairments Affecting Rehab Potential positive: good PLOF; negative: chronic condition, progressive OA, age, fall risk; Patient's clinical presentation is evolving as OA is a progressive condition and she has severe OA in BLE knee;    PT Frequency 2x / week   PT Duration 4 weeks   PT Treatment/Interventions Aquatic Therapy;Cryotherapy;Gait training;Moist Heat;Stair training;Functional mobility training;Therapeutic activities;Therapeutic exercise;Balance training;Neuromuscular re-education;Manual techniques;Patient/family education;Taping;Dry needling   PT Next Visit Plan advance HEP, work on strengthening, weight bearing activities   PT Home Exercise Plan advanced; see patient instructions   Consulted and Agree with Plan of Care Patient      Patient will benefit from skilled therapeutic intervention in order to improve the following deficits and impairments:  Pain, Decreased mobility, Decreased activity tolerance, Decreased endurance, Decreased strength, Impaired flexibility, Decreased balance  Visit Diagnosis: Chronic pain of left knee - Plan: PT plan of  care cert/re-cert  Chronic pain of right knee - Plan: PT plan of care cert/re-cert  Muscle weakness (generalized) - Plan: PT plan of care cert/re-cert     Problem List Patient Active Problem List   Diagnosis Date Noted  . Right lumbar radiculopathy 09/28/2015    Class: Chronic  . Painful orthopaedic hardware ([place]) 09/28/2015    Class: Chronic    Senay Sistrunk PT, DPT 07/08/2016, 1:58 PM  [place] MAIN [facility] SERVICES [address] [place], [place], [address] Phone: [phone]   Fax:  [phone]  Name: Samantha Garcia MRN: [id number] Date of Birth: 02-15-1932

## 2016-07-10 ENCOUNTER — Ambulatory Visit: Payer: PPO | Admitting: Physical Therapy

## 2016-07-14 ENCOUNTER — Encounter: Payer: Self-pay | Admitting: Physical Therapy

## 2016-07-14 ENCOUNTER — Ambulatory Visit: Payer: PPO | Admitting: Physical Therapy

## 2016-07-14 DIAGNOSIS — M25561 Pain in right knee: Secondary | ICD-10-CM

## 2016-07-14 DIAGNOSIS — G8929 Other chronic pain: Secondary | ICD-10-CM

## 2016-07-14 DIAGNOSIS — M25562 Pain in left knee: Secondary | ICD-10-CM | POA: Diagnosis not present

## 2016-07-14 DIAGNOSIS — M6281 Muscle weakness (generalized): Secondary | ICD-10-CM

## 2016-07-14 NOTE — Therapy (Signed)
[place] MAIN [facility] SERVICES [address] [place], [place], [address] Phone: [phone]   Fax:  [phone]  Physical Therapy Treatment  Patient Details  Name: Samantha Garcia MRN: [id number] Date of Birth: December 26, 1931 Referring Provider: Joan Mayans MD  Encounter Date: 07/14/2016      PT End of Session - 07/14/16 1312    Visit Number 9   Number of Visits 21   Date for PT Re-Evaluation 08/05/16   Authorization Type gcode 9   Authorization Time Period 10   PT Start Time 1300   PT Stop Time 1345   PT Time Calculation (min) 45 min   Activity Tolerance Patient tolerated treatment well;No increased pain   Behavior During Therapy WFL for tasks assessed/performed      Past Medical History:  Diagnosis Date  . Arthritis    B Knee  . Cancer ([place])    Left Breast  . Complication of anesthesia    Pt stated it took her a long time to wake up after having Hysterectomy in the 70s, but has not had any issues since  . Dysrhythmia    Atrial Fibrillation  . Hypertension    controlled;   . PPD positive 1968    Past Surgical History:  Procedure Laterality Date  . ABDOMINAL HYSTERECTOMY  09/20/75  . APPENDECTOMY    . BACK SURGERY  03/12/10  . BREAST LUMPECTOMY Left   . BREAST SURGERY Left 11/14/10   DCIS carcinoma InSitu   . CARDIAC CATHETERIZATION    . CATARACT EXTRACTION Bilateral   . COLONOSCOPY W/ POLYPECTOMY    . COLPORRHAPHY  02/15/01   Anterior repair   . HARDWARE REMOVAL N/A 09/28/2015   Procedure: REMOVAL OF RIGHT PEDICLE SCREWS AND ROD L3, L4, L5 Wiltse approach;  Surgeon: Jessy Oto, MD;  Location: [place];  Service: Orthopedics;  Laterality: N/A;  . ROTATOR CUFF REPAIR Left 06/15/08    There were no vitals filed for this visit.      Subjective Assessment - 07/14/16 1311    Subjective Patient reports increased right groin pain this morning which was relieved some by heat. Patient reports no new changes; Denies any new falls;     Pertinent History personal factors affecting rehab include: Progressive OA, lives in 2 story home, hx of CA, multiple surgeries to back,    Limitations Standing;Other (comment)  transfers;    How long can you sit comfortably? NA   How long can you stand comfortably? 15 min   How long can you walk comfortably? once she gets going she feels she can walk a fair distance;    Patient Stated Goals be able to climb stairs with less pain; be able to move around easier; avoid surgery;    Currently in Pain? Yes   Pain Score 8    Pain Location Groin   Pain Orientation Right   Pain Descriptors / Indicators Aching;Burning;Sharp   Pain Type Chronic pain   Pain Onset More than a month ago         TREATMENT: PT applied moist heat to BLE knee in hooklying position x5 min (unbilled);  Educated patient/caregiver in different manual techniques to reduce joint pain: Long axis distraction RLE 10 sec hold, 5 sec rest x10 min with patient in supine with RLE in neutral and approximately 30 degrees abduction; Educated caregiver in how to adjust hand/body position to reduce caregiver discomfort;  Seated; Educated caregiver/patient in knee tibia/femur joint distraction 10 sec  hold x4-5 min; Grade II-III PA/AP mobs tibia on femur with cues for hand placement to isolate just tibia movement for better joint flexibility;   Patient reports less pain with manual therapy and caregiver verbalized understanding for better HEP compliance;  Patient educated in LE strengthening:  Red tband terminal knee extension x15 bilaterally with mod VCs for correct positioning to isolate quad contraction;  Terminal knee extension green ball on wall 5 sec hold x15 bilaterally;  Leg press: 75# 2x10 with BLE with cues to slow down eccentric return for better quad strengthening;  Patient denies any pain after treatment session;                         PT Education - 07/14/16 1312    Education provided  Yes   Education Details manual techniques with caregivers; strengthening/ROM   Person(s) Educated Patient   Methods Explanation;Verbal cues   Comprehension Verbalized understanding;Returned demonstration;Verbal cues required             PT Long Term Goals - 07/08/16 1322      PT LONG TERM GOAL #1   Title Patient will be independent in home exercise program to improve strength/mobility for better functional independence with ADLs.   Time 4   Period Weeks   Status On-going     PT LONG TERM GOAL #2   Title Patient (> 49 years old) will complete five times sit to stand test in < 20 seconds indicating an increased LE strength and improved balance.   Time 4   Period Weeks   Status Partially Met     PT LONG TERM GOAL #3   Title Patient will ascend/descend 4 stairs with rail assist mod- independently without loss of balance to improve ability to get in/out of home.    Time 4   Period Weeks   Status Partially Met     PT LONG TERM GOAL #4   Title Patient will increase lower extremity functional scale to >60/80 to demonstrate improved functional mobility and increased tolerance with ADLs.    Time 4   Period Weeks   Status Partially Met     PT LONG TERM GOAL #5   Title Patient will increase BLE gross strength to 4+/5 as to improve functional strength for independent gait, increased standing tolerance and increased ADL ability.   Time 4   Period Weeks   Status Partially Met               Plan - 07/14/16 1345    Clinical Impression Statement Patient's grandson attended session; Educated patient and caregiver in different manual techniques to reduce hip/knee pain with long axis and seated joint distraction; Also educated patient/caregiver in knee joint mobs and how to perform mobs safely and correctly for better joint flexibility and to reduce joint pain; Patient responded well; Required mod VCS for correct exercise technique for better strengthening; She would benefit from  additional skilled PT intervention to improve LE ROM/strength and reduce pain with ADLs;    Rehab Potential Fair   Clinical Impairments Affecting Rehab Potential positive: good PLOF; negative: chronic condition, progressive OA, age, fall risk; Patient's clinical presentation is evolving as OA is a progressive condition and she has severe OA in BLE knee;    PT Frequency 2x / week   PT Duration 4 weeks   PT Treatment/Interventions Aquatic Therapy;Cryotherapy;Gait training;Moist Heat;Stair training;Functional mobility training;Therapeutic activities;Therapeutic exercise;Balance training;Neuromuscular re-education;Manual techniques;Patient/family education;Taping;Dry needling   PT Next  Visit Plan advance HEP, work on strengthening, weight bearing activities   PT Home Exercise Plan advanced; see patient instructions   Consulted and Agree with Plan of Care Patient      Patient will benefit from skilled therapeutic intervention in order to improve the following deficits and impairments:  Pain, Decreased mobility, Decreased activity tolerance, Decreased endurance, Decreased strength, Impaired flexibility, Decreased balance  Visit Diagnosis: Chronic pain of left knee  Chronic pain of right knee  Muscle weakness (generalized)     Problem List Patient Active Problem List   Diagnosis Date Noted  . Right lumbar radiculopathy 09/28/2015    Class: Chronic  . Painful orthopaedic hardware ([place]) 09/28/2015    Class: Chronic    Aarika Moon PT, DPT 07/14/2016, 1:47 PM  [place] MAIN [facility] SERVICES [address] [place], [place], [address] Phone: [phone]   Fax:  [phone]  Name: Samantha Garcia MRN: [id number] Date of Birth: 06/22/1932

## 2016-07-16 ENCOUNTER — Encounter: Payer: Self-pay | Admitting: Physical Therapy

## 2016-07-16 ENCOUNTER — Ambulatory Visit: Payer: PPO | Admitting: Physical Therapy

## 2016-07-16 DIAGNOSIS — M25561 Pain in right knee: Secondary | ICD-10-CM

## 2016-07-16 DIAGNOSIS — M6281 Muscle weakness (generalized): Secondary | ICD-10-CM

## 2016-07-16 DIAGNOSIS — G8929 Other chronic pain: Secondary | ICD-10-CM

## 2016-07-16 DIAGNOSIS — M25562 Pain in left knee: Principal | ICD-10-CM

## 2016-07-16 NOTE — Therapy (Signed)
[place] MAIN [facility] SERVICES [address] [place], [place], [address] Phone: [phone]   Fax:  [phone]  Physical Therapy Treatment  Patient Details  Name: Samantha Garcia MRN: [id number] Date of Birth: 1932-04-11 Referring Provider: Joan Mayans MD  Encounter Date: 07/16/2016      PT End of Session - 07/16/16 1124    Visit Number 10   Number of Visits 21   Date for PT Re-Evaluation 08/05/16   Authorization Type gcode 10   Authorization Time Period 10   PT Start Time 1114   PT Stop Time 1158   PT Time Calculation (min) 44 min   Activity Tolerance Patient tolerated treatment well;No increased pain   Behavior During Therapy WFL for tasks assessed/performed      Past Medical History:  Diagnosis Date  . Arthritis    B Knee  . Cancer ([place])    Left Breast  . Complication of anesthesia    Pt stated it took her a long time to wake up after having Hysterectomy 83, but has not had any issues since  . Dysrhythmia    Atrial Fibrillation  . Hypertension    controlled;   . PPD positive 1968    Past Surgical History:  Procedure Laterality Date  . ABDOMINAL HYSTERECTOMY  09/20/75  . APPENDECTOMY    . BACK SURGERY  03/12/10  . BREAST LUMPECTOMY Left   . BREAST SURGERY Left 11/14/10   DCIS carcinoma InSitu   . CARDIAC CATHETERIZATION    . CATARACT EXTRACTION Bilateral   . COLONOSCOPY W/ POLYPECTOMY    . COLPORRHAPHY  02/15/01   Anterior repair   . HARDWARE REMOVAL N/A 09/28/2015   Procedure: REMOVAL OF RIGHT PEDICLE SCREWS AND ROD L3, L4, L5 Wiltse approach;  Surgeon: Jessy Oto, MD;  Location: [place];  Service: Orthopedics;  Laterality: N/A;  . ROTATOR CUFF REPAIR Left 06/15/08    There were no vitals filed for this visit.      Subjective Assessment - 07/16/16 1122    Subjective Patient reports having increased soreness this morning and just having a harder time getting out of bed; Patient reports increased knee  discomfort today as compared to yesterday; She also reports increased back pain; Patient denies any other changes; She reports doing some walking yesterday, but nothing out of the ordinary;    Pertinent History personal factors affecting rehab include: Progressive OA, lives in 2 story home, hx of CA, multiple surgeries to back,    Limitations Standing;Other (comment)  transfers;    How long can you sit comfortably? NA   How long can you stand comfortably? 15 min   How long can you walk comfortably? once she gets going she feels she can walk a fair distance;    Patient Stated Goals be able to climb stairs with less pain; be able to move around easier; avoid surgery;    Currently in Pain? Yes   Pain Score 7    Pain Location Knee   Pain Orientation Left   Pain Descriptors / Indicators Aching;Sharp;Sore   Pain Type Chronic pain   Pain Onset More than a month ago         TREATMENT: PT applied moist heat to BLE knee in hooklying position x5 min (unbilled);  Patient in hooklying with legs over bolster with moist heat: SAQ 2x10 bilaterally; Hooklying without moist heat: Red tband around both legs: Hip abduction/ER 2x10; Hip flexion march 2x10;  Patient  required min-moderate verbal/tactile cues for correct exercise technique including cues to increase ROM and avoid painful position;    Seated; PT performed, knee tibia/femur joint distraction 10 sec hold x4-5 min each LE; Grade II-III PA/AP mobs tibia on femur 10 sec bouts x3 sets each, bilaterally; Patient reports less pain with manual therapy. She exhibits less crepitis and less discomfort with LAQ following manual techniques;  Leg press: 60# 2x12 with BLE with cues to slow down eccentric return for better quad strengthening;  Standing Hip flexion march red tband x10 bilaterally;  Patient reports continued left sided back discomfort with prolonged standing; She did respond well with treatment session reporting less knee  discomfort at end of treatment;                         PT Education - 07/16/16 1124    Education provided Yes   Education Details ROM/strengthenig exercise;    Person(s) Educated Patient   Methods Explanation;Verbal cues   Comprehension Verbalized understanding;Returned demonstration;Verbal cues required             PT Long Term Goals - 07/08/16 1322      PT LONG TERM GOAL #1   Title Patient will be independent in home exercise program to improve strength/mobility for better functional independence with ADLs.   Time 4   Period Weeks   Status On-going     PT LONG TERM GOAL #2   Title Patient (> 60 years old) will complete five times sit to stand test in < 20 seconds indicating an increased LE strength and improved balance.   Time 4   Period Weeks   Status Partially Met     PT LONG TERM GOAL #3   Title Patient will ascend/descend 4 stairs with rail assist mod- independently without loss of balance to improve ability to get in/out of home.    Time 4   Period Weeks   Status Partially Met     PT LONG TERM GOAL #4   Title Patient will increase lower extremity functional scale to >60/80 to demonstrate improved functional mobility and increased tolerance with ADLs.    Time 4   Period Weeks   Status Partially Met     PT LONG TERM GOAL #5   Title Patient will increase BLE gross strength to 4+/5 as to improve functional strength for independent gait, increased standing tolerance and increased ADL ability.   Time 4   Period Weeks   Status Partially Met               Plan - 07/16/16 1300    Clinical Impression Statement Patient responded well to moist heat and manual therapy with less knee discomfort and less crepitis with movement; She requires min Vcs for correct exercise technique with strengthening exercise. Patient continues to report low back discomfort on left side with prolonged standing. Patient would benefit from additional skilled PT  intervention to improve joint mobility and reduce pain with ADLs.    Rehab Potential Fair   Clinical Impairments Affecting Rehab Potential positive: good PLOF; negative: chronic condition, progressive OA, age, fall risk; Patient's clinical presentation is evolving as OA is a progressive condition and she has severe OA in BLE knee;    PT Frequency 2x / week   PT Duration 4 weeks   PT Treatment/Interventions Aquatic Therapy;Cryotherapy;Gait training;Moist Heat;Stair training;Functional mobility training;Therapeutic activities;Therapeutic exercise;Balance training;Neuromuscular re-education;Manual techniques;Patient/family education;Taping;Dry needling   PT Next Visit Plan advance HEP,   work on Hotel [employer], [employer] bearing activities   PT Home Exercise Plan advanced; see patient instructions   Consulted and Agree with Plan of Care Patient      Patient will benefit from skilled therapeutic intervention in order to improve the following deficits and impairments:  Pain, Decreased mobility, Decreased activity tolerance, Decreased endurance, Decreased strength, Impaired flexibility, Decreased balance  Visit Diagnosis: Chronic pain of left knee  Chronic pain of right knee  Muscle weakness (generalized)       G-Codes - 01-Aug-2016 1346    Functional Assessment Tool Used Clinical judgement, LEFs, 5 times sit<>stand;    Functional Limitation Mobility: Walking and moving around   Mobility: Walking and Moving Around Current Status [phone]) At least 40 percent but less than 60 percent impaired, limited or restricted   Mobility: Walking and Moving Around Goal Status [phone]) At least 20 percent but less than 40 percent impaired, limited or restricted      Problem List Patient Active Problem List   Diagnosis Date Noted  . Right lumbar radiculopathy 09/28/2015    Class: Chronic  . Painful orthopaedic hardware ([place]) 09/28/2015    Class: Chronic    Nafis Farnan PT, DPT 08-01-16, 1:48 PM  [place] MAIN [facility] SERVICES [address] [place], [place], [address] Phone: [phone]   Fax:  [phone]  Name: ALEJANDRO ADCOX MRN: [id number] Date of Birth: 1931-11-24

## 2016-07-21 ENCOUNTER — Ambulatory Visit: Payer: PPO | Admitting: Physical Therapy

## 2016-07-21 ENCOUNTER — Encounter: Payer: Self-pay | Admitting: Physical Therapy

## 2016-07-21 DIAGNOSIS — M6281 Muscle weakness (generalized): Secondary | ICD-10-CM

## 2016-07-21 DIAGNOSIS — G8929 Other chronic pain: Secondary | ICD-10-CM

## 2016-07-21 DIAGNOSIS — M25562 Pain in left knee: Secondary | ICD-10-CM | POA: Diagnosis not present

## 2016-07-21 DIAGNOSIS — M25561 Pain in right knee: Secondary | ICD-10-CM

## 2016-07-21 NOTE — Therapy (Signed)
[place] MAIN [facility] SERVICES [address] [place], [place], [address] Phone: [phone]   Fax:  [phone]  Physical Therapy Treatment  Patient Details  Name: Samantha Garcia MRN: [id number] Date of Birth: 11-09-1931 Referring Provider: Joan Mayans MD  Encounter Date: 07/21/2016      PT End of Session - 07/21/16 1153    Visit Number 11   Number of Visits 21   Date for PT Re-Evaluation 08/05/16   Authorization Type gcode 1   Authorization Time Period 10   PT Start Time 1104   PT Stop Time 1145   PT Time Calculation (min) 41 min   Activity Tolerance Patient tolerated treatment well;No increased pain   Behavior During Therapy WFL for tasks assessed/performed      Past Medical History:  Diagnosis Date  . Arthritis    B Knee  . Cancer ([place])    Left Breast  . Complication of anesthesia    Pt stated it took her a long time to wake up after having Hysterectomy in the 70s, but has not had any issues since  . Dysrhythmia    Atrial Fibrillation  . Hypertension    controlled;   . PPD positive 1968    Past Surgical History:  Procedure Laterality Date  . ABDOMINAL HYSTERECTOMY  09/20/75  . APPENDECTOMY    . BACK SURGERY  03/12/10  . BREAST LUMPECTOMY Left   . BREAST SURGERY Left 11/14/10   DCIS carcinoma InSitu   . CARDIAC CATHETERIZATION    . CATARACT EXTRACTION Bilateral   . COLONOSCOPY W/ POLYPECTOMY    . COLPORRHAPHY  02/15/01   Anterior repair   . HARDWARE REMOVAL N/A 09/28/2015   Procedure: REMOVAL OF RIGHT PEDICLE SCREWS AND ROD L3, L4, L5 Wiltse approach;  Surgeon: Jessy Oto, MD;  Location: [place];  Service: Orthopedics;  Laterality: N/A;  . ROTATOR CUFF REPAIR Left 06/15/08    There were no vitals filed for this visit.      Subjective Assessment - 07/21/16 1152    Subjective Patient reports having a little discomfort related to the weather. she reports feeling a little better this morning though. Reports doing a lot  of walking but still having trouble with stairs and transfers.    Pertinent History personal factors affecting rehab include: Progressive OA, lives in 2 story home, hx of CA, multiple surgeries to back,    Limitations Standing;Other (comment)  transfers;    How long can you sit comfortably? NA   How long can you stand comfortably? 15 min   How long can you walk comfortably? once she gets going she feels she can walk a fair distance;    Patient Stated Goals be able to climb stairs with less pain; be able to move around easier; avoid surgery;    Currently in Pain? Yes   Pain Score 5    Pain Location Knee   Pain Orientation Right;Left   Pain Descriptors / Indicators Aching;Sore   Pain Type Chronic pain   Pain Onset More than a month ago            TREATMENT:  Seated; PT performed, knee tibia/femur joint distraction 10 sec hold x4-5 min each LE; Grade II-III PA/AP mobs tibia on femur 10 sec bouts x3 sets each, bilaterally; Patient reports less pain with manual therapy. She exhibits less crepitis and less discomfort with LAQ following manual techniques;  Exercise: Sit<>stand from elevated mat table: x5 reps with cues  to increase forward lean for better transfer ability;  Educated patient in how to get up from a low commode with cues for hand placement to improve transfer ability;  Leg press: 60# 2x12 with BLE with cues to slow down eccentric return for better quad strengthening; 60# heel raises 2x12 with min VCs for correct set up and positioning to improve ankle strengthening;  HOIST hamstring curl: Plate #5 7T02 with cues to increase knee flexion for better hamstring strengthening;  Standing: Green tband hip abduction x10 bilaterally; Green tband hip extension x10 bilaterally with cues to avoid forward lean and to increase gluteal contraction for better hip extensor strengthening; Side stepping with green tband x10 feet x2 laps with 2 HHA for balance;  Standing  terminal knee extension against green small ball against wall x10 bilaterally with 3 sec hold for better quad strengthening;   Patient responded well with less knee discomfort at end of treatment session;                        PT Education - 07/21/16 1153    Education provided Yes   Education Details ROM/strengthening exercise; HEP reinforced; hand placement with toilet transfers;    Person(s) Educated Patient   Methods Explanation;Verbal cues   Comprehension Verbalized understanding;Returned demonstration;Verbal cues required             PT Long Term Goals - 07/08/16 1322      PT LONG TERM GOAL #1   Title Patient will be independent in home exercise program to improve strength/mobility for better functional independence with ADLs.   Time 4   Period Weeks   Status On-going     PT LONG TERM GOAL #2   Title Patient (> 29 years old) will complete five times sit to stand test in < 20 seconds indicating an increased LE strength and improved balance.   Time 4   Period Weeks   Status Partially Met     PT LONG TERM GOAL #3   Title Patient will ascend/descend 4 stairs with rail assist mod- independently without loss of balance to improve ability to get in/out of home.    Time 4   Period Weeks   Status Partially Met     PT LONG TERM GOAL #4   Title Patient will increase lower extremity functional scale to >60/80 to demonstrate improved functional mobility and increased tolerance with ADLs.    Time 4   Period Weeks   Status Partially Met     PT LONG TERM GOAL #5   Title Patient will increase BLE gross strength to 4+/5 as to improve functional strength for independent gait, increased standing tolerance and increased ADL ability.   Time 4   Period Weeks   Status Partially Met               Plan - 07/21/16 1153    Clinical Impression Statement Patient responds well to manual therapy with less knee discomfort; Overall she tolerated strengthening  exercise well without an increase in pain; educated patient in better hand placement with low toilet transfers with cues to put hands at front of commode to improve forward lean. Patient would benefit from additional skilled PT intervention to improve LE strength, balance and gait safety;    Rehab Potential Fair   Clinical Impairments Affecting Rehab Potential positive: good PLOF; negative: chronic condition, progressive OA, age, fall risk; Patient's clinical presentation is evolving as OA is a progressive condition and  she has severe OA in BLE knee;    PT Frequency 2x / week   PT Duration 4 weeks   PT Treatment/Interventions Aquatic Therapy;Cryotherapy;Gait training;Moist Heat;Stair training;Functional mobility training;Therapeutic activities;Therapeutic exercise;Balance training;Neuromuscular re-education;Manual techniques;Patient/family education;Taping;Dry needling   PT Next Visit Plan advance HEP, work on strengthening, weight bearing activities   PT Home Exercise Plan advanced; see patient instructions   Consulted and Agree with Plan of Care Patient      Patient will benefit from skilled therapeutic intervention in order to improve the following deficits and impairments:  Pain, Decreased mobility, Decreased activity tolerance, Decreased endurance, Decreased strength, Impaired flexibility, Decreased balance  Visit Diagnosis: Chronic pain of left knee  Chronic pain of right knee  Muscle weakness (generalized)     Problem List Patient Active Problem List   Diagnosis Date Noted  . Right lumbar radiculopathy 09/28/2015    Class: Chronic  . Painful orthopaedic hardware ([place]) 09/28/2015    Class: Chronic    Dandria Griego PT, DPT 07/21/2016, 11:55 AM  [place] MAIN [facility] SERVICES [address] [place], [place], [address] Phone: [phone]   Fax:  [phone]  Name: SALEM MASTROGIOVANNI MRN: [id number] Date of Birth: 02/03/1932

## 2016-07-23 ENCOUNTER — Encounter: Payer: Self-pay | Admitting: Physical Therapy

## 2016-07-23 ENCOUNTER — Ambulatory Visit: Payer: PPO | Admitting: Physical Therapy

## 2016-07-23 DIAGNOSIS — M25562 Pain in left knee: Secondary | ICD-10-CM | POA: Diagnosis not present

## 2016-07-23 DIAGNOSIS — M6281 Muscle weakness (generalized): Secondary | ICD-10-CM

## 2016-07-23 DIAGNOSIS — G8929 Other chronic pain: Secondary | ICD-10-CM

## 2016-07-23 DIAGNOSIS — M25561 Pain in right knee: Secondary | ICD-10-CM

## 2016-07-23 NOTE — Therapy (Signed)
[place] MAIN [facility] SERVICES [address] [place], [place], [address] Phone: [phone]   Fax:  [phone]  Physical Therapy Treatment  Patient Details  Name: Samantha Garcia MRN: [id number] Date of Birth: 05/13/1932 Referring Provider: Joan Mayans MD  Encounter Date: 07/23/2016      PT End of Session - 07/23/16 1113    Visit Number 12   Number of Visits 21   Date for PT Re-Evaluation 08/05/16   Authorization Type gcode 2   Authorization Time Period 10   PT Start Time 1105   PT Stop Time 1146   PT Time Calculation (min) 41 min   Activity Tolerance Patient tolerated treatment well;No increased pain   Behavior During Therapy WFL for tasks assessed/performed      Past Medical History:  Diagnosis Date  . Arthritis    B Knee  . Cancer ([place])    Left Breast  . Complication of anesthesia    Pt stated it took her a long time to wake up after having Hysterectomy in the 70s, but has not had any issues since  . Dysrhythmia    Atrial Fibrillation  . Hypertension    controlled;   . PPD positive 1968    Past Surgical History:  Procedure Laterality Date  . ABDOMINAL HYSTERECTOMY  09/20/75  . APPENDECTOMY    . BACK SURGERY  03/12/10  . BREAST LUMPECTOMY Left   . BREAST SURGERY Left 11/14/10   DCIS carcinoma InSitu   . CARDIAC CATHETERIZATION    . CATARACT EXTRACTION Bilateral   . COLONOSCOPY W/ POLYPECTOMY    . COLPORRHAPHY  02/15/01   Anterior repair   . HARDWARE REMOVAL N/A 09/28/2015   Procedure: REMOVAL OF RIGHT PEDICLE SCREWS AND ROD L3, L4, L5 Wiltse approach;  Surgeon: Jessy Oto, MD;  Location: [place];  Service: Orthopedics;  Laterality: N/A;  . ROTATOR CUFF REPAIR Left 06/15/08    There were no vitals filed for this visit.      Subjective Assessment - 07/23/16 1113    Subjective Patient reports doing well; she reports having some knee discomfort last night prior to going to bed but states that it is better this morning.  "I was a little tired this morning and didn't really want to get up."    Pertinent History personal factors affecting rehab include: Progressive OA, lives in 2 story home, hx of CA, multiple surgeries to back,    Limitations Standing;Other (comment)  transfers;    How long can you sit comfortably? NA   How long can you stand comfortably? 15 min   How long can you walk comfortably? once she gets going she feels she can walk a fair distance;    Patient Stated Goals be able to climb stairs with less pain; be able to move around easier; avoid surgery;    Currently in Pain? No/denies   Pain Onset More than a month ago         TREATMENT: Warm up on Nustep BUE/BLE level 2 x5 min (unbilled);  Leg press: 60# 2x15 with BLE with cues to slow down eccentric return for better quad strengthening; 60# heel raises 2x15 with min VCs for correct set up and positioning to improve ankle strengthening;  HOIST hamstring curl: Plate #5 1X79 with cues to increase knee flexion for better hamstring strengthening;  Standing: Green tband diagonal steps forward/backward 10 feet x3 laps with mod Vcs for sequencing and exercise technique for better strengthening;  Standing terminal knee extension with green tband with mod VCs for positioning and exercise technique x15 bilaterally;  Educated patient in stair negotiation:  #4 steps with B rail forward reciprocal x2 reps with cues for foot placement to improve safety; #6 steps 6 inch tall, with 1 rail, forward reciprocal x2 reps with cues to improve foot placement for better safety; Patient requires close supervision but is able to demonstrate better step negotiation without knee pain; She is able to ascend without pulling heavily on UE and able to push well through LE;  Patient responded well without pain at end of session;                           PT Education - 07/23/16 1113    Education provided Yes   Education Details  ROM/strengthening; HEP reinforced;    Person(s) Educated Patient   Methods Explanation;Verbal cues   Comprehension Verbalized understanding;Returned demonstration;Verbal cues required             PT Long Term Goals - 07/08/16 1322      PT LONG TERM GOAL #1   Title Patient will be independent in home exercise program to improve strength/mobility for better functional independence with ADLs.   Time 4   Period Weeks   Status On-going     PT LONG TERM GOAL #2   Title Patient (> 44 years old) will complete five times sit to stand test in < 20 seconds indicating an increased LE strength and improved balance.   Time 4   Period Weeks   Status Partially Met     PT LONG TERM GOAL #3   Title Patient will ascend/descend 4 stairs with rail assist mod- independently without loss of balance to improve ability to get in/out of home.    Time 4   Period Weeks   Status Partially Met     PT LONG TERM GOAL #4   Title Patient will increase lower extremity functional scale to >60/80 to demonstrate improved functional mobility and increased tolerance with ADLs.    Time 4   Period Weeks   Status Partially Met     PT LONG TERM GOAL #5   Title Patient will increase BLE gross strength to 4+/5 as to improve functional strength for independent gait, increased standing tolerance and increased ADL ability.   Time 4   Period Weeks   Status Partially Met               Plan - 07/23/16 1133    Clinical Impression Statement Patient instructed in advanced LE exercise. Increased repetition for better strengthening; She required min Vcs for correct exercise technique; Patient able to tolerate advanced exercise with less knee pain; She would benefit from additional skilled PT intervention to improve strength, reduce knee pain and return to PLOF.    Rehab Potential Fair   Clinical Impairments Affecting Rehab Potential positive: good PLOF; negative: chronic condition, progressive OA, age, fall risk;  Patient's clinical presentation is evolving as OA is a progressive condition and she has severe OA in BLE knee;    PT Frequency 2x / week   PT Duration 4 weeks   PT Treatment/Interventions Aquatic Therapy;Cryotherapy;Gait training;Moist Heat;Stair training;Functional mobility training;Therapeutic activities;Therapeutic exercise;Balance training;Neuromuscular re-education;Manual techniques;Patient/family education;Taping;Dry needling   PT Next Visit Plan advance HEP, work on strengthening, weight bearing activities   PT [place] continue as given;    Consulted and Agree with Plan of Care  Patient      Patient will benefit from skilled therapeutic intervention in order to improve the following deficits and impairments:  Pain, Decreased mobility, Decreased activity tolerance, Decreased endurance, Decreased strength, Impaired flexibility, Decreased balance  Visit Diagnosis: Chronic pain of left knee  Chronic pain of right knee  Muscle weakness (generalized)     Problem List Patient Active Problem List   Diagnosis Date Noted  . Right lumbar radiculopathy 09/28/2015    Class: Chronic  . Painful orthopaedic hardware ([place]) 09/28/2015    Class: Chronic    Leamon Palau PT, DPT 07/23/2016, 11:52 AM  [place] MAIN [facility] SERVICES [address] [place], [place], [address] Phone: [phone]   Fax:  [phone]  Name: Samantha Garcia MRN: [id number] Date of Birth: [birth year]

## 2016-07-28 ENCOUNTER — Encounter: Payer: Self-pay | Admitting: Physical Therapy

## 2016-07-28 ENCOUNTER — Ambulatory Visit: Payer: PPO | Attending: Physical Medicine & Rehabilitation | Admitting: Physical Therapy

## 2016-07-28 DIAGNOSIS — M25562 Pain in left knee: Secondary | ICD-10-CM | POA: Insufficient documentation

## 2016-07-28 DIAGNOSIS — G8929 Other chronic pain: Secondary | ICD-10-CM | POA: Diagnosis not present

## 2016-07-28 DIAGNOSIS — M6281 Muscle weakness (generalized): Secondary | ICD-10-CM | POA: Insufficient documentation

## 2016-07-28 DIAGNOSIS — M25561 Pain in right knee: Secondary | ICD-10-CM | POA: Insufficient documentation

## 2016-07-28 NOTE — Patient Instructions (Addendum)
Body-Weight Forward Lunge: Stable - Stationary (Active)    Stand in wide stride, legs shoulder width apart, head up, back flat. Step forward and bend knee, then step back to starting position. Avoid excessive knee bend for less knee discomfort.  Complete _1__ sets of __10_ repetitions. Perform _2__ sessions per day.  Copyright  VHI. All rights reserved.

## 2016-07-28 NOTE — Therapy (Signed)
[place] MAIN [facility] SERVICES [address] [place], [place], [address] Phone: [phone]   Fax:  [phone]  Physical Therapy Treatment  Patient Details  Name: Samantha Garcia MRN: [id number] Date of Birth: 1932-06-03 Referring Provider: Joan Mayans MD  Encounter Date: 07/28/2016      PT End of Session - 07/28/16 1311    Visit Number 13   Number of Visits 21   Date for PT Re-Evaluation 08/05/16   Authorization Type gcode 3   Authorization Time Period 10   PT Start Time 1300   PT Stop Time 1345   PT Time Calculation (min) 45 min   Activity Tolerance Patient tolerated treatment well;No increased pain   Behavior During Therapy WFL for tasks assessed/performed      Past Medical History:  Diagnosis Date  . Arthritis    B Knee  . Cancer ([place])    Left Breast  . Complication of anesthesia    Pt stated it took her a long time to wake up after having Hysterectomy in the 70s, but has not had any issues since  . Dysrhythmia    Atrial Fibrillation  . Hypertension    controlled;   . PPD positive 1968    Past Surgical History:  Procedure Laterality Date  . ABDOMINAL HYSTERECTOMY  09/20/75  . APPENDECTOMY    . BACK SURGERY  03/12/10  . BREAST LUMPECTOMY Left   . BREAST SURGERY Left 11/14/10   DCIS carcinoma InSitu   . CARDIAC CATHETERIZATION    . CATARACT EXTRACTION Bilateral   . COLONOSCOPY W/ POLYPECTOMY    . COLPORRHAPHY  02/15/01   Anterior repair   . HARDWARE REMOVAL N/A 09/28/2015   Procedure: REMOVAL OF RIGHT PEDICLE SCREWS AND ROD L3, L4, L5 Wiltse approach;  Surgeon: Jessy Oto, MD;  Location: [place];  Service: Orthopedics;  Laterality: N/A;  . ROTATOR CUFF REPAIR Left 06/15/08    There were no vitals filed for this visit.      Subjective Assessment - 07/28/16 1310    Subjective Patient reports having increased back pain this AM and had to take a tramadol. She reports not having any knee pain currently; Reports being  compliant with HEP;    Pertinent History personal factors affecting rehab include: Progressive OA, lives in 2 story home, hx of CA, multiple surgeries to back,    Limitations Standing;Other (comment)  transfers;    How long can you sit comfortably? NA   How long can you stand comfortably? 15 min   How long can you walk comfortably? once she gets going she feels she can walk a fair distance;    Patient Stated Goals be able to climb stairs with less pain; be able to move around easier; avoid surgery;    Currently in Pain? No/denies   Pain Onset More than a month ago         TREATMENT: Warm up on Nustep BUE/BLE level 2 x4 min (unbilled);  Leg press: 60# 2x12 with BLE with cues to slow down eccentric return for better quad strengthening; 60# heel raises 2x12 with min VCs for correct set up and positioning to improve ankle strengthening;  HOIST hamstring curl: Plate #5 1P50 with cues to increase knee flexion for better hamstring strengthening;  Standing: Green tband around both legs: Hip abduction x15 bilaterally; Hip extension x15 bilaterally; Side stepping 10 feet x3 laps each direction; Required min VCs to avoid lateral trunk lean and to slow down  LE movement for better strengthening;  Green tband diagonal stepping 10 feet x2 forward/backward with 1 rail assist with min VCs to increase hip abduction during diagonal stepping for better strengthening;  Forward lunges stepping back to starting position x5 bilaterally without rail assist with CGA for safety; Advanced HEP with forward lunge to improve knee control and balance.   Hip flexion green tband x15 bilaterally with min VCs for postural control for better hip strengthening;  Standing terminal knee extension with green ball against wall with min VCs for positioning and exercise technique x20 bilaterally; Standing against wall: mini squat with small green ball between knees with mod VCs for positioning and to avoid  excessive knee flexion 2x10;   Patient responded well without pain at end of session; She did have slight discomfort with RLE knee during some exercise, especially with wall squats;                           PT Education - 07/28/16 1310    Education provided Yes   Education Details ROM/strengthening, HEP reinforced;    Person(s) Educated Patient   Methods Explanation;Verbal cues   Comprehension Verbalized understanding;Returned demonstration;Verbal cues required             PT Long Term Goals - 07/08/16 1322      PT LONG TERM GOAL #1   Title Patient will be independent in home exercise program to improve strength/mobility for better functional independence with ADLs.   Time 4   Period Weeks   Status On-going     PT LONG TERM GOAL #2   Title Patient (> 81 years old) will complete five times sit to stand test in < 20 seconds indicating an increased LE strength and improved balance.   Time 4   Period Weeks   Status Partially Met     PT LONG TERM GOAL #3   Title Patient will ascend/descend 4 stairs with rail assist mod- independently without loss of balance to improve ability to get in/out of home.    Time 4   Period Weeks   Status Partially Met     PT LONG TERM GOAL #4   Title Patient will increase lower extremity functional scale to >60/80 to demonstrate improved functional mobility and increased tolerance with ADLs.    Time 4   Period Weeks   Status Partially Met     PT LONG TERM GOAL #5   Title Patient will increase BLE gross strength to 4+/5 as to improve functional strength for independent gait, increased standing tolerance and increased ADL ability.   Time 4   Period Weeks   Status Partially Met               Plan - 07/28/16 1337    Clinical Impression Statement Patient instructed in LE strengthening exercise. She required min Vcs to improve positioning and weight shift for better strengthening; Patient responded well without  pain at end of session; She did have trouble with dynamic balance stepping back together following forward lunges. She would benefit from additional skilled PT intervention to improve LE strength, balance and gait safety;    Rehab Potential Fair   Clinical Impairments Affecting Rehab Potential positive: good PLOF; negative: chronic condition, progressive OA, age, fall risk; Patient's clinical presentation is evolving as OA is a progressive condition and she has severe OA in BLE knee;    PT Frequency 2x / week   PT Duration 4  weeks   PT Treatment/Interventions Aquatic Therapy;Cryotherapy;Gait training;Moist Heat;Stair training;Functional mobility training;Therapeutic activities;Therapeutic exercise;Balance training;Neuromuscular re-education;Manual techniques;Patient/family education;Taping;Dry needling   PT Next Visit Plan advance HEP, work on strengthening, weight bearing activities   PT [place] continue as given;    Consulted and Agree with Plan of Care Patient      Patient will benefit from skilled therapeutic intervention in order to improve the following deficits and impairments:  Pain, Decreased mobility, Decreased activity tolerance, Decreased endurance, Decreased strength, Impaired flexibility, Decreased balance  Visit Diagnosis: Chronic pain of left knee  Chronic pain of right knee  Muscle weakness (generalized)     Problem List Patient Active Problem List   Diagnosis Date Noted  . Right lumbar radiculopathy 09/28/2015    Class: Chronic  . Painful orthopaedic hardware ([place]) 09/28/2015    Class: Chronic    Toan Mort  PT, DPT 07/28/2016, 1:46 PM  [place] MAIN [facility] SERVICES [address] [place], [place], [address] Phone: [phone]   Fax:  [phone]  Name: Samantha Garcia MRN: [id number] Date of Birth: December 21, 1931

## 2016-07-30 ENCOUNTER — Ambulatory Visit: Payer: PPO | Admitting: Physical Therapy

## 2016-07-30 ENCOUNTER — Encounter: Payer: Self-pay | Admitting: Physical Therapy

## 2016-07-30 DIAGNOSIS — M6281 Muscle weakness (generalized): Secondary | ICD-10-CM

## 2016-07-30 DIAGNOSIS — M25561 Pain in right knee: Secondary | ICD-10-CM

## 2016-07-30 DIAGNOSIS — G8929 Other chronic pain: Secondary | ICD-10-CM

## 2016-07-30 DIAGNOSIS — M25562 Pain in left knee: Principal | ICD-10-CM

## 2016-07-30 NOTE — Therapy (Signed)
[place] MAIN [facility] SERVICES [address] [place], [place], [address] Phone: [phone]   Fax:  [phone]  Physical Therapy Treatment  Patient Details  Name: Samantha Garcia MRN: [id number] Date of Birth: 04/30/1932 Referring Provider: Joan Mayans MD  Encounter Date: 07/30/2016      PT End of Session - 07/30/16 1311    Visit Number 14   Number of Visits 21   Date for PT Re-Evaluation 08/05/16   Authorization Type gcode 4   Authorization Time Period 10   PT Start Time 1302   PT Stop Time 1345   PT Time Calculation (min) 43 min   Activity Tolerance Patient tolerated treatment well;No increased pain   Behavior During Therapy WFL for tasks assessed/performed      Past Medical History:  Diagnosis Date  . Arthritis    B Knee  . Cancer ([place])    Left Breast  . Complication of anesthesia    Pt stated it took her a long time to wake up after having Hysterectomy in the 70s, but has not had any issues since  . Dysrhythmia    Atrial Fibrillation  . Hypertension    controlled;   . PPD positive 1968    Past Surgical History:  Procedure Laterality Date  . ABDOMINAL HYSTERECTOMY  09/20/75  . APPENDECTOMY    . BACK SURGERY  03/12/10  . BREAST LUMPECTOMY Left   . BREAST SURGERY Left 11/14/10   DCIS carcinoma InSitu   . CARDIAC CATHETERIZATION    . CATARACT EXTRACTION Bilateral   . COLONOSCOPY W/ POLYPECTOMY    . COLPORRHAPHY  02/15/01   Anterior repair   . HARDWARE REMOVAL N/A 09/28/2015   Procedure: REMOVAL OF RIGHT PEDICLE SCREWS AND ROD L3, L4, L5 Wiltse approach;  Surgeon: Jessy Oto, MD;  Location: [place];  Service: Orthopedics;  Laterality: N/A;  . ROTATOR CUFF REPAIR Left 06/15/08    There were no vitals filed for this visit.      Subjective Assessment - 07/30/16 1310    Subjective Patient reports having some discomfort in BLE knees and pain in low back; She reports having trouble sleeping last night due to pain. She reports  less knee pain currently but is still having the back discomfort;    Pertinent History personal factors affecting rehab include: Progressive OA, lives in 2 story home, hx of CA, multiple surgeries to back,    Limitations Standing;Other (comment)  transfers;    How long can you sit comfortably? NA   How long can you stand comfortably? 15 min   How long can you walk comfortably? once she gets going she feels she can walk a fair distance;    Patient Stated Goals be able to climb stairs with less pain; be able to move around easier; avoid surgery;    Currently in Pain? Yes   Pain Score 8    Pain Location Back   Pain Orientation Left;Lower   Pain Descriptors / Indicators Sharp   Pain Type Chronic pain   Pain Onset More than a month ago        TREATMENT: Warm up on Nustep BUE/BLE level 2 x5 min (unbilled) concurrent with moist heat to low back;  Leg press: 60# 2x12with BLE with cues to slow down eccentric return for better quad strengthening; 60# heel raises 2x12 with min VCs for correct set up and positioning to improve ankle strengthening;  HOIST hamstring curl: Plate #5 7M54 with cues  to increase knee flexion for better hamstring strengthening;  Standing: Green tband around both legs: Side stepping  With green tband around knees, 10 feet x3 laps each direction; Required min VCs to avoid lateral trunk lean and to slow down LE movement for better strengthening;  Forward lunges stepping back to starting position x10 bilaterally without rail assist with CGA for safety; Advanced HEP with forward lunge to improve knee control and balance.Patient requires mod VCS to avoid excessive knee bend and to improve weight shift for better stepping ability;    Hip flexion green tband seated, 2x15 bilaterally with min VCs for postural control for better hip strengthening;  Standing terminal knee extension with green ball against wall with min VCs for positioning and exercise technique 3  sec x10 bilaterally;Required min VCs to increase hold time for better strengthening;    Patient responded well without pain at end of session; She did have slight discomfort with RLE knee during some exercise, especially with wall squats;                            PT Education - 07/30/16 1311    Education provided Yes   Education Details strengthening, ROM; HEP reinforced;    Person(s) Educated Patient   Methods Explanation;Verbal cues   Comprehension Verbalized understanding;Returned demonstration;Verbal cues required             PT Long Term Goals - 07/08/16 1322      PT LONG TERM GOAL #1   Title Patient will be independent in home exercise program to improve strength/mobility for better functional independence with ADLs.   Time 4   Period Weeks   Status On-going     PT LONG TERM GOAL #2   Title Patient (> 41 years old) will complete five times sit to stand test in < 20 seconds indicating an increased LE strength and improved balance.   Time 4   Period Weeks   Status Partially Met     PT LONG TERM GOAL #3   Title Patient will ascend/descend 4 stairs with rail assist mod- independently without loss of balance to improve ability to get in/out of home.    Time 4   Period Weeks   Status Partially Met     PT LONG TERM GOAL #4   Title Patient will increase lower extremity functional scale to >60/80 to demonstrate improved functional mobility and increased tolerance with ADLs.    Time 4   Period Weeks   Status Partially Met     PT LONG TERM GOAL #5   Title Patient will increase BLE gross strength to 4+/5 as to improve functional strength for independent gait, increased standing tolerance and increased ADL ability.   Time 4   Period Weeks   Status Partially Met               Plan - 07/30/16 1517    Clinical Impression Statement Patient instructed in advanced LE strengthening exercise. PT re-educated patient in forward lunges with cues  to focus on weight shifting and less on knee flexion. Patient reports no pain at end of session. She would benefit from additional skilled PT intervention to improve LE strength and functional mobility; Patient still has trouble with getting up/down from low commode.    Rehab Potential Fair   Clinical Impairments Affecting Rehab Potential positive: good PLOF; negative: chronic condition, progressive OA, age, fall risk; Patient's clinical presentation is evolving as  OA is a progressive condition and she has severe OA in BLE knee;    PT Frequency 2x / week   PT Duration 4 weeks   PT Treatment/Interventions Aquatic Therapy;Cryotherapy;Gait training;Moist Heat;Stair training;Functional mobility training;Therapeutic activities;Therapeutic exercise;Balance training;Neuromuscular re-education;Manual techniques;Patient/family education;Taping;Dry needling   PT Next Visit Plan advance HEP, work on strengthening, weight bearing activities   PT [place] continue as given;    Consulted and Agree with Plan of Care Patient      Patient will benefit from skilled therapeutic intervention in order to improve the following deficits and impairments:  Pain, Decreased mobility, Decreased activity tolerance, Decreased endurance, Decreased strength, Impaired flexibility, Decreased balance  Visit Diagnosis: Chronic pain of left knee  Chronic pain of right knee  Muscle weakness (generalized)     Problem List Patient Active Problem List   Diagnosis Date Noted  . Right lumbar radiculopathy 09/28/2015    Class: Chronic  . Painful orthopaedic hardware ([place]) 09/28/2015    Class: Chronic    Trotter,Margaret PT, DPT 07/30/2016, 3:19 PM   [place] MAIN [facility] SERVICES [address] [place], [place], [address] Phone: [phone]   Fax:  [phone]  Name: Samantha Garcia MRN: [id number] Date of Birth: [birth year]

## 2016-08-04 ENCOUNTER — Encounter: Payer: PPO | Admitting: Physical Therapy

## 2016-08-04 DIAGNOSIS — M25561 Pain in right knee: Secondary | ICD-10-CM | POA: Diagnosis not present

## 2016-08-04 DIAGNOSIS — M170 Bilateral primary osteoarthritis of knee: Secondary | ICD-10-CM | POA: Diagnosis not present

## 2016-08-04 DIAGNOSIS — M25562 Pain in left knee: Secondary | ICD-10-CM | POA: Diagnosis not present

## 2016-08-06 ENCOUNTER — Encounter: Payer: PPO | Admitting: Physical Therapy

## 2016-08-12 ENCOUNTER — Ambulatory Visit: Payer: PPO | Admitting: Physical Therapy

## 2016-08-12 ENCOUNTER — Encounter: Payer: Self-pay | Admitting: Physical Therapy

## 2016-08-12 DIAGNOSIS — M6281 Muscle weakness (generalized): Secondary | ICD-10-CM

## 2016-08-12 DIAGNOSIS — M25562 Pain in left knee: Principal | ICD-10-CM

## 2016-08-12 DIAGNOSIS — M25561 Pain in right knee: Secondary | ICD-10-CM

## 2016-08-12 DIAGNOSIS — G8929 Other chronic pain: Secondary | ICD-10-CM

## 2016-08-12 NOTE — Therapy (Signed)
[place] MAIN [facility] SERVICES [address] [place], [place], [address] Phone: [phone]   Fax:  [phone]                                                       Physical Therapy Treatment/Discharge Summary Patient Details  Name: Samantha Garcia MRN: [id number] Date of Birth: 1931-12-09 Referring Provider: Joan Mayans MD  Encounter Date: 08/12/2016      PT End of Session - 08/12/16 1544    Visit Number 15   Number of Visits 22   Date for PT Re-Evaluation 08/19/16   Authorization Type gcode 5   Authorization Time Period 10   PT Start Time 1515   PT Stop Time 1600   PT Time Calculation (min) 45 min   Activity Tolerance Patient tolerated treatment well;No increased pain   Behavior During Therapy WFL for tasks assessed/performed      Past Medical History:  Diagnosis Date  . Arthritis    B Knee  . Cancer ([place])    Left Breast  . Complication of anesthesia    Pt stated it took her a long time to wake up after having Hysterectomy in the 70s, but has not had any issues since  . Dysrhythmia    Atrial Fibrillation  . Hypertension    controlled;   . PPD positive 1968    Past Surgical History:  Procedure Laterality Date  . ABDOMINAL HYSTERECTOMY  09/20/75  . APPENDECTOMY    . BACK SURGERY  03/12/10  . BREAST LUMPECTOMY Left   . BREAST SURGERY Left 11/14/10   DCIS carcinoma InSitu   . CARDIAC CATHETERIZATION    . CATARACT EXTRACTION Bilateral   . COLONOSCOPY W/ POLYPECTOMY    . COLPORRHAPHY  02/15/01   Anterior repair   . HARDWARE REMOVAL N/A 09/28/2015   Procedure: REMOVAL OF RIGHT PEDICLE SCREWS AND ROD L3, L4, L5 Wiltse approach;  Surgeon: Jessy Oto, MD;  Location: [place];  Service: Orthopedics;  Laterality: N/A;  . ROTATOR CUFF REPAIR Left 06/15/08    There were no vitals filed for this visit.      Subjective Assessment - 08/12/16 1704    Subjective Patient reports significant less discomfort following steroid injections in  knee last week. She reports still having some difficulty with getting up from low seat. She is able to negotiate steps and get in/out of car easier.    Pertinent History personal factors affecting rehab include: Progressive OA, lives in 2 story home, hx of CA, multiple surgeries to back,    Limitations Standing;Other (comment)  transfers;    How long can you sit comfortably? NA   How long can you stand comfortably? 15 min   How long can you walk comfortably? once she gets going she feels she can walk a fair distance;    Patient Stated Goals be able to climb stairs with less pain; be able to move around easier; avoid surgery;    Currently in Pain? No/denies   Pain Onset More than a month ago            [facility] PT Assessment - 08/12/16 0001      Standardized Balance Assessment   Five times sit to stand comments  18 sec with BUE pushing on arm rests (Improved from  initial eval on 05/26/16 which was 48 sec, >15 sec indicates increased risk for falls)     TREATMENT: Warm up on Nustep BUE/BLE level 2 x (unbilled) concurrent with moist heat to low back;   HOIST hamstring curl: Plate #5 2O46 with cues to increase knee flexion for better hamstring strengthening;   Instructed patient in supine/sidelying exercise for HEP for strengthening: Supine: Lumbar trunk rotation x1 min; Single knee to chest 15 sec hold x1 bilaterally; Quad sets 2 sec hold x10 bilaterally; SLR flexion x10 bilaterally with min VCs to keep opposite knee bent for comfort; Bridges x10  Sidelying: Clamshells with green tband x10 bilaterally;    Patient required min-moderate verbal/tactile cues for correct exercise technique including cues for positioning and to increase ROM for better strengthening; Patient denies any increase in pain with advanced exercise;   Educated patient on safe equipment to use at the gym and the importance of continued strengthening for functional mobility;                     PT Education - 08/12/16 1544    Education provided Yes   Education Details strengthening, HEP advanced, recommendations;    Person(s) Educated Patient   Methods Explanation;Verbal cues;Handout   Comprehension Verbalized understanding;Returned demonstration;Verbal cues required             PT Long Term Goals - 08/12/16 1548      PT LONG TERM GOAL #1   Title Patient will be independent in home exercise program to improve strength/mobility for better functional independence with ADLs.   Time 1   Period Weeks   Status Achieved     PT LONG TERM GOAL #2   Title Patient (> 35 years old) will complete five times sit to stand test in < 20 seconds indicating an increased LE strength and improved balance.   Time 1   Period Weeks   Status Achieved     PT LONG TERM GOAL #3   Title Patient will ascend/descend 4 stairs with rail assist mod- independently without loss of balance to improve ability to get in/out of home.    Time 1   Period Weeks   Status Achieved     PT LONG TERM GOAL #4   Title Patient will increase lower extremity functional scale to >60/80 to demonstrate improved functional mobility and increased tolerance with ADLs.    Time 1   Period Weeks   Status Partially Met     PT LONG TERM GOAL #5   Title Patient will increase BLE gross strength to 4+/5 as to improve functional strength for independent gait, increased standing tolerance and increased ADL ability.   Time 1   Period Weeks   Status Partially Met               Plan - 08/12/16 1546    Clinical Impression Statement Patient reports getting a cortisone injection last week and states that she has been doing very well. She reports very minimal pain. She reports still having some difficulty with getting up from low seat but is able to negotiate stairs better and get in/out of car better. Patient is independent in all self care ADLs. She was educated in HEP for continued  strengthening. Patient has met most goals. Recommend patient be discharged from PT at this time and recommend patient continue with HEP and/or gym exercise.    Rehab Potential Fair   Clinical Impairments Affecting Rehab Potential positive: good PLOF;  negative: chronic condition, progressive OA, age, fall risk; Patient's clinical presentation is evolving as OA is a progressive condition and she has severe OA in BLE knee;    PT Frequency 1x / week   PT Duration Other (comment)  1 week   PT Treatment/Interventions Aquatic Therapy;Cryotherapy;Gait training;Moist Heat;Stair training;Functional mobility training;Therapeutic activities;Therapeutic exercise;Balance training;Neuromuscular re-education;Manual techniques;Patient/family education;Taping;Dry needling   PT Next Visit Plan advance HEP, work on strengthening, weight bearing activities   PT [place] continue as given;    Consulted and Agree with Plan of Care Patient      Patient will benefit from skilled therapeutic intervention in order to improve the following deficits and impairments:  Pain, Decreased mobility, Decreased activity tolerance, Decreased endurance, Decreased strength, Impaired flexibility, Decreased balance  Visit Diagnosis: Chronic pain of left knee - Plan: PT plan of care cert/re-cert  Chronic pain of right knee - Plan: PT plan of care cert/re-cert  Muscle weakness (generalized) - Plan: PT plan of care cert/re-cert       G-Codes - 08/28/16 1706    Functional Assessment Tool Used (Outpatient Only) clinical judgement, 5 times sit<>stand   Functional Limitation Mobility: Walking and moving around   Mobility: Walking and Moving Around Goal Status [phone]) At least 20 percent but less than 40 percent impaired, limited or restricted   Mobility: Walking and Moving Around Discharge Status [phone]) At least 20 percent but less than 40 percent impaired, limited or restricted      Problem List Patient Active Problem  List   Diagnosis Date Noted  . Right lumbar radiculopathy 09/28/2015    Class: Chronic  . Painful orthopaedic hardware ([place]) 09/28/2015    Class: Chronic    Takenya Travaglini PT, DPT 2016/08/28, 5:08 PM  [place] MAIN [facility] SERVICES [address] [place], [place], [address] Phone: [phone]   Fax:  [phone]  Name: Samantha Garcia MRN: [id number] Date of Birth: [birth year]

## 2016-08-12 NOTE — Patient Instructions (Addendum)
Copyright  VHI. All rights reserved. Knee to Chest (Flexion)   Pull knee toward chest. Feel stretch in lower back or buttock area. Breathing deeply, Hold __15__ seconds. Repeat with other knee. Repeat _2-3___ times. Do _2-3___ sessions per day.  http://gt2.exer.us/225   Copyright  VHI. All rights reserved.   Lower Trunk Rotation Stretch  Lying on back with knees bent, Keeping back flat and feet together, rotate knees side to side slowly and in pain free range of motion.  Hold _2___ seconds. Repeat for 1-2 minutes. Do __1__ sets per session. Do __2-3__ sessions per day.  http://orth.exer.us/122     Heel Slide    Bend knee and pull heel toward buttocks. Only slide to point of comfort, start out maybe sliding a little bit and then slowly increase range of motion as knee pain decreases. Hold __2__ seconds. Return. Repeat with other knee. Repeat _10___ times. Do _2___ sessions per day.  http://gt2.exer.us/372   Copyright  VHI. All rights reserved.     Copyright  VHI. All rights reserved.   Bridge   Lie back, legs bent. Inhale, pressing hips up.  Exhale, rolling down along spine from top. Repeat _10___ times. Do __1-2__ sessions per day.  Copyright  VHI. All rights reserved.    Strengthening: Straight Leg Raise (Phase 1)  Lying on back with one knee bent and one knee straight Tighten muscles on front of right thigh, then lift leg _3-5___ inches from surface, keeping knee locked.  Repeat __10__ times per set. Do __1__ sets per session. Do __1__ sessions per day.  http://orth.exer.us/614   Copyright  VHI. All rights reserved.   Abduction: Clam (Eccentric) - Side-Lying   Lie on side with knees bent. Lift top knee, keeping feet together. Keep trunk steady. Slowly lower for 3-5 seconds. __10_ reps per set, __2_ sets per day, _5__ days per week. Tie                     Band around both knees for resistance.  Copyright  VHI. All rights reserved.

## 2016-08-25 DIAGNOSIS — E785 Hyperlipidemia, unspecified: Secondary | ICD-10-CM | POA: Diagnosis not present

## 2016-08-25 DIAGNOSIS — I4891 Unspecified atrial fibrillation: Secondary | ICD-10-CM | POA: Diagnosis not present

## 2016-09-03 DIAGNOSIS — M170 Bilateral primary osteoarthritis of knee: Secondary | ICD-10-CM | POA: Diagnosis not present

## 2016-09-03 DIAGNOSIS — Z0000 Encounter for general adult medical examination without abnormal findings: Secondary | ICD-10-CM | POA: Diagnosis not present

## 2016-09-11 DIAGNOSIS — Z961 Presence of intraocular lens: Secondary | ICD-10-CM | POA: Diagnosis not present

## 2016-11-11 ENCOUNTER — Other Ambulatory Visit (INDEPENDENT_AMBULATORY_CARE_PROVIDER_SITE_OTHER): Payer: Self-pay | Admitting: Specialist

## 2016-11-11 MED ORDER — TRAMADOL HCL 50 MG PO TABS: 100.0000 mg | ORAL_TABLET | Freq: Four times a day (QID) | ORAL | 0 refills | Status: DC | PRN

## 2016-11-11 NOTE — Telephone Encounter (Signed)
Patient called needing Rx refilled (Tramadol) The number to contact patient is [phone]

## 2016-11-11 NOTE — Telephone Encounter (Signed)
I Called rx to [facility]

## 2016-11-11 NOTE — Telephone Encounter (Signed)
I called pt and advised pt that rx was called to her pharm.

## 2016-12-04 DIAGNOSIS — Z08 Encounter for follow-up examination after completed treatment for malignant neoplasm: Secondary | ICD-10-CM | POA: Diagnosis not present

## 2016-12-04 DIAGNOSIS — Z853 Personal history of malignant neoplasm of breast: Secondary | ICD-10-CM | POA: Diagnosis not present

## 2016-12-04 DIAGNOSIS — C50912 Malignant neoplasm of unspecified site of left female breast: Secondary | ICD-10-CM | POA: Diagnosis not present

## 2016-12-04 DIAGNOSIS — C50919 Malignant neoplasm of unspecified site of unspecified female breast: Secondary | ICD-10-CM | POA: Diagnosis not present

## 2016-12-25 ENCOUNTER — Ambulatory Visit (INDEPENDENT_AMBULATORY_CARE_PROVIDER_SITE_OTHER): Payer: PPO

## 2016-12-25 ENCOUNTER — Encounter (INDEPENDENT_AMBULATORY_CARE_PROVIDER_SITE_OTHER): Payer: Self-pay | Admitting: Specialist

## 2016-12-25 ENCOUNTER — Ambulatory Visit (INDEPENDENT_AMBULATORY_CARE_PROVIDER_SITE_OTHER): Payer: PPO | Admitting: Specialist

## 2016-12-25 VITALS — BP 170/69 | HR 61 | Ht 64.5 in | Wt 150.0 lb

## 2016-12-25 DIAGNOSIS — M545 Low back pain: Secondary | ICD-10-CM | POA: Diagnosis not present

## 2016-12-25 DIAGNOSIS — M170 Bilateral primary osteoarthritis of knee: Secondary | ICD-10-CM | POA: Diagnosis not present

## 2016-12-25 NOTE — Progress Notes (Signed)
Office Visit Note   Patient: Samantha Garcia           Date of Birth: 1932-04-06           MRN: [id number] Visit Date: 12/25/2016              Requested by: No referring provider defined for this encounter. PCP: System, Pcp Not In   Assessment & Plan: Visit Diagnoses:  1. Low back pain, unspecified back pain laterality, unspecified chronicity, with sciatica presence unspecified   2. Bilateral primary osteoarthritis of knee     Plan: Knee is suffering from osteoarthritis, only real proven treatments are Weight loss, NSIADs like tylenol and exercise. Well padded shoes help. Ice the knee 2-3 times a day 15-20 mins at a time. Exercises of the knee are helpful in decreasing patellofemoral pain. Avoid bending, stooping and avoid lifting weights greater than 10 lbs. Avoid prolong standing and walking. Avoid frequent bending and stooping  No lifting greater than 10 lbs. May use ice or moist heat for pain. Weight loss is of benefit. Handicap license is approved. Dr. Romona Curls secretary/Assistant will call to arrange for epidural steroid injection   Follow-Up Instructions: No Follow-up on file.   Orders:  Orders Placed This Encounter  Procedures  . XR Lumbar Spine 2-3 Views  . XR Knee 1-2 Views Right  . XR Knee 1-2 Views Left   No orders of the defined types were placed in this encounter.     Procedures: No procedures performed   Clinical Data: No additional findings.   Subjective: Chief Complaint  Patient presents with  . Lower Back - Pain    HPI  Review of Systems   Objective: Vital Signs: BP (!) 170/69 (BP Location: Left Arm, Patient Position: Sitting)   Pulse 61   Ht 5' 4.5" (1.638 m)   Wt 150 lb (68 kg)   BMI 25.35 kg/m   Physical Exam  Ortho Exam  Specialty Comments:  No specialty comments available.  Imaging: Xr Lumbar Spine 2-3 Views  Result Date: 12/25/2016 AP and lateral flexion and extension radiographs of the lumbar spine show XLIFs at  the L3-4 and L4-5 levels, the L3-4 XLIF is subluxed to the right but has not changed its position or alignment since last xray 11/2015.There is a grade 2 spondylolisthesis at L5-S1 with mild disc space narrowing which is worsened compared with previous radiograph. The listhesis at L5-S1 appears similar to previous radiograph at 25-30% listhesis anteriorly  L5 on S1.     PMFS History: Patient Active Problem List   Diagnosis Date Noted  . Right lumbar radiculopathy 09/28/2015    Priority: High    Class: Chronic  . Painful orthopaedic hardware ([place]) 09/28/2015    Priority: High    Class: Chronic   Past Medical History:  Diagnosis Date  . Arthritis    B Knee  . Cancer ([place])    Left Breast  . Complication of anesthesia    Pt stated it took her a long time to wake up after having Hysterectomy in the 70s, but has not had any issues since  . Dysrhythmia    Atrial Fibrillation  . Hypertension    controlled;   . PPD positive 1968    No family history on file.  Past Surgical History:  Procedure Laterality Date  . ABDOMINAL HYSTERECTOMY  09/20/75  . APPENDECTOMY    . BACK SURGERY  03/12/10  . BREAST LUMPECTOMY Left   . BREAST SURGERY Left  11/14/10   DCIS carcinoma InSitu   . CARDIAC CATHETERIZATION    . CATARACT EXTRACTION Bilateral   . COLONOSCOPY W/ POLYPECTOMY    . COLPORRHAPHY  02/15/01   Anterior repair   . HARDWARE REMOVAL N/A 09/28/2015   Procedure: REMOVAL OF RIGHT PEDICLE SCREWS AND ROD L3, L4, L5 Wiltse approach;  Surgeon: Jessy Oto, MD;  Location: [place];  Service: Orthopedics;  Laterality: N/A;  . ROTATOR CUFF REPAIR Left 06/15/08   Social History   Occupational History  . Not on file.   Social History Main Topics  . Smoking status: Former Smoker    Packs/day: 1.00    Years: 3.00    Types: Cigarettes    Quit date: 06/23/1965  . Smokeless tobacco: Never Used  . Alcohol use No  . Drug use: No  . Sexual activity: Not on file

## 2016-12-25 NOTE — Patient Instructions (Signed)
  Knee is suffering from osteoarthritis, only real proven treatments are Weight loss, NSIADs like tylenol and exercise. Well padded shoes help. Ice the knee 2-3 times a day 15-20 mins at a time.  

## 2016-12-26 ENCOUNTER — Telehealth (INDEPENDENT_AMBULATORY_CARE_PROVIDER_SITE_OTHER): Payer: Self-pay | Admitting: Specialist

## 2016-12-26 ENCOUNTER — Other Ambulatory Visit (INDEPENDENT_AMBULATORY_CARE_PROVIDER_SITE_OTHER): Payer: Self-pay | Admitting: Specialist

## 2016-12-26 NOTE — Telephone Encounter (Signed)
Tramadol Refill request

## 2016-12-26 NOTE — Telephone Encounter (Signed)
[facility] 

## 2016-12-26 NOTE — Telephone Encounter (Signed)
Request has been sent to Dr. Louanne Skye already

## 2016-12-26 NOTE — Telephone Encounter (Signed)
Patient called needing Rx refilled (Tramadol)  Patient said she will run out tomorrow.The number to contact patient is [phone]

## 2017-01-06 ENCOUNTER — Encounter: Payer: Self-pay | Admitting: Physical Therapy

## 2017-01-06 ENCOUNTER — Ambulatory Visit: Payer: PPO | Attending: Specialist | Admitting: Physical Therapy

## 2017-01-06 DIAGNOSIS — G8929 Other chronic pain: Secondary | ICD-10-CM | POA: Diagnosis not present

## 2017-01-06 DIAGNOSIS — M25562 Pain in left knee: Secondary | ICD-10-CM | POA: Insufficient documentation

## 2017-01-06 DIAGNOSIS — M25561 Pain in right knee: Secondary | ICD-10-CM | POA: Diagnosis not present

## 2017-01-06 DIAGNOSIS — M6281 Muscle weakness (generalized): Secondary | ICD-10-CM

## 2017-01-06 NOTE — Therapy (Signed)
[place] MAIN [facility] SERVICES [address] [place], [place], [address] Phone: [phone]   Fax:  [phone]  Physical Therapy Evaluation  Patient Details  Name: Samantha Garcia MRN: [id number] Date of Birth: 07-Mar-1932 Referring Provider: Basil Dess  Encounter Date: 01/06/2017      PT End of Session - 01/06/17 1649    Visit Number 1   Number of Visits 16   Date for PT Re-Evaluation 03/03/17   Authorization Type gcode 1   Authorization Time Period 10   PT Start Time 1530   PT Stop Time 1630   PT Time Calculation (min) 60 min   Equipment Utilized During Treatment Gait belt   Activity Tolerance Patient tolerated treatment well   Behavior During Therapy [facility] for tasks assessed/performed      Past Medical History:  Diagnosis Date  . Arthritis    B Knee  . Cancer ([place])    Left Breast; 5 years cancer free(7/17)  . Complication of anesthesia    Pt stated it took her a long time to wake up after having Hysterectomy in the 70s, but has not had any issues since  . Dysrhythmia    Atrial Fibrillation  . Hypertension    controlled by medication  . PPD positive 1968    Past Surgical History:  Procedure Laterality Date  . ABDOMINAL HYSTERECTOMY  09/20/75  . APPENDECTOMY    . BACK SURGERY  03/12/10  . BREAST LUMPECTOMY Left   . BREAST SURGERY Left 11/14/10   DCIS carcinoma InSitu   . CARDIAC CATHETERIZATION    . CATARACT EXTRACTION Bilateral   . COLONOSCOPY W/ POLYPECTOMY    . COLPORRHAPHY  02/15/01   Anterior repair   . HARDWARE REMOVAL N/A 09/28/2015   Procedure: REMOVAL OF RIGHT PEDICLE SCREWS AND ROD L3, L4, L5 Wiltse approach;  Surgeon: Jessy Oto, MD;  Location: [place];  Service: Orthopedics;  Laterality: N/A;  . ROTATOR CUFF REPAIR Left 06/15/08    There were no vitals filed for this visit.       Subjective Assessment - 01/06/17 1535    Subjective Pt reports a fall a month ago on level surface; pt reports falling on knees;  Reports discomfort on R knee more than L and has small swelling on R knee cap; Pt reports no issues with balance;    Pertinent History 83 yo Female presents to therapy for bilateral knee osteoarthritis. Pt reports she used to run and had to stop because of knee pain a while ago; Pt has arthritis in knees, back and hands; Pt had back surgery one year ago in April to remove the hardware in low back; the screws had loosened up and was aggravating a nerve; Pt symptoms resolved completely immediately after surgery for 3 months; back pain started coming back July 2017 and the knee arthritis had also further developed; Pt had gotten better in february after therapy for the knees; but the pain and stiffness has increased to where she couldnt get out of bed; takes tramadol 2x day but tries to limit that; Pt has not been able to keep up with HEP from previous therapy and not feeling well; Pt reported a fall about 1 month ago;   Limitations Standing;Walking;Sitting;Lifting;House hold activities   How long can you sit comfortably? couple hours   How long can you stand comfortably? longer with feet apart about 30 min   How long can you walk comfortably? 100 ft   Diagnostic  tests xray shows arthritis in bilateral knees;    Patient Stated Goals climb stairs with no pain;   Currently in Pain? No/denies  took tramadol this morning and excedrin right before coming   Aggravating Factors  sit to stand; stairs   Pain Relieving Factors rest; heat/ice            [facility] PT Assessment - 01/07/17 0001      Assessment   Medical Diagnosis bilateral knee OA   Referring Provider Basil Dess   Onset Date/Surgical Date 12/25/16   Hand Dominance Left   Next MD Visit February 19, 2017    Prior Therapy outpatient PT from Dec-Feb 2018 for bilateral knee pain with good results;      Precautions   Precautions Fall     Balance Screen   Has the patient fallen in the past 6 months Yes   How many times? 1   Has the patient  had a decrease in activity level because of a fear of falling?  Yes   Is the patient reluctant to leave their home because of a fear of falling?  No     Home Environment   Living Environment Private residence   Living Arrangements Alone   Available Help at Discharge Neighbor;Family   Type of [place] to enter   Entrance Stairs-Number of Steps 5 stairs in back; 3 stairs in front   Entrance Stairs-Rails Right  right side rail in front; both rails in back   [place] Two level  wants to climb stairs to clean for [place] None     Prior Function   Level of [place] Retired     Associate Professor   Overall Cognitive Status Within Functional Limits for tasks assessed     Sensation   Light Touch Appears Intact   Additional Comments feels like she is walking on a thick pad on bottom of foot     Posture/Postural Control   Posture/Postural Control No significant limitations     AROM   Overall AROM Comments BLE WFL     Strength   Right Hip Flexion 4/5   Right Hip ABduction 4-/5  increased back pain    Left Hip Flexion 4/5   Left Hip ABduction 4-/5   Left Hip ADduction 4-/5  increased back pain   Right Knee Flexion 4/5   Right Knee Extension 3/5  limited by pain   Left Knee Flexion 4/5   Left Knee Extension 3/5  limited by knee pain   Right Ankle Dorsiflexion 5/5   Left Ankle Dorsiflexion 5/5     Palpation   Palpation comment Pt has bump on right patella from fall; Pt has increased tenderness around R medial patella      Ambulation/Gait   Ambulation/Gait Yes   Ambulation/Gait Assistance 7: Independent   Gait Pattern Within Functional Limits;Step-through pattern   Stairs Yes   Stairs Assistance 7: Independent  forward one step at a time;    Stair Management Technique One rail Right  pt had increase in back and knee pain to ascend and descend   Number of Stairs 4   Height of Stairs 6     Standardized Balance  Assessment   Five times sit to stand comments  68 sec with BUE on arm rests (increased risk for fall) more impaired from last PT in Feb 2018 which was 18 sec with BUE pushing on arm rests;  10 Meter Walk .83 (community ambulator) slightly impaired from last PT in Dec 2017 which was 0.95 m/s            Objective measurements completed on examination: See above findings.    PT TREATMENT;  Pt was instructed in quad sets x 5 ea with 5 sec hold; pillow under knee; min cues for maximal quad activation Pt was instructed in hooklying Hip Abduction/ER; resistance red tband at thigh x 5 ea; to improve abduction strength; pt reported no increase in pain  Pt instructed to attempt step ups on stair at home; on L because R is too painful;  Reinforced HEP as given by MD for patient compliance and better LE tolerance;  Pt educated on ice/heat for pain relief;              PT Education - 01/06/17 1649    Education provided Yes   Education Details Plan of care/recommendations; HEP instruction; ice/heat   Person(s) Educated Patient   Methods Explanation;Handout;Demonstration   Comprehension Verbalized understanding;Returned demonstration             PT Long Term Goals - 01/06/17 1726      PT LONG TERM GOAL #1   Title Pt will be independent with HEP to improve carryover and increase strength, mobility and gait negotiation;   Time 8   Period Weeks   Status New     PT LONG TERM GOAL #2   Title Pt will decrease the 5 x sit to stand by 30 seconds in order to decrease risk of fall and improve function;   Baseline 68 sec   Time 8   Period Weeks   Status New     PT LONG TERM GOAL #3   Title Pt will negotiate 4, 6" stairs with R rail assist independently with no increase in pain in order to improve ability to get in/out of home;   Time 8   Period Weeks   Status New     PT LONG TERM GOAL #4   Title Pt will increase LE strength bilaterally to 4+/5 in order to improve sit to  stand and improve tolerance to functional activities;   Time 8   Period Weeks   Status New                Plan - 01/06/17 1706    Clinical Impression Statement Pt is a 83 y.o. female that presents to therapy with bilateral knee OA; Pt presents to therapy with no pain in knee; Pt has had significant decrease in function since last seen in therapy in February; Pt sit to stand is more impaired from 18 sec to 68 sec and pt also has more difficulty with stairs; Pt also presents with bilateral LE weakness; Patient is independent with all ADLs and household activities; Pt is just limited in activity tolerance due to pain and weakness; Pt will benefit from skilled PT in order to increase strength; balance; and gait safety;    History and Personal Factors relevant to plan of care: lives alone; radicular back pain; high fall risk; stairs to enter home; afib   Clinical Presentation Evolving   Clinical Presentation due to: Pt is evolving due to chronic condition with fluctuating symptoms; increased sit to stand time she is at risk of falls; pt back pain is also limiting to activity    Clinical Decision Making Moderate   Rehab Potential Fair   Clinical Impairments Affecting Rehab Potential negative-chronic, lives alone, back  pain, age, fall risk, BLE, not keeping up with HEP from spring; positive- motivation to decrease pain; independent   PT Frequency 2x / week   PT Duration 8 weeks   PT Treatment/Interventions ADLs/Self Care Home Management;Gait training;Stair training;Neuromuscular re-education;Energy conservation;Functional mobility training;Therapeutic activities;Therapeutic exercise;Moist Heat;Manual techniques;Passive range of motion;Balance training;Cryotherapy;Aquatic Therapy;Electrical Stimulation;Ultrasound;Patient/family education;Taping;Dry needling   PT Next Visit Plan patella mobs; strengthening; consider filling out LEFs   PT Home Exercise Plan see pt instructions; adjusted HEP given  from orthopedic doctor   Consulted and Agree with Plan of Care Patient      Patient will benefit from skilled therapeutic intervention in order to improve the following deficits and impairments:  Pain, Decreased mobility, Decreased activity tolerance, Decreased endurance, Decreased range of motion, Decreased strength, Difficulty walking, Decreased balance, Hypomobility, Impaired sensation, Increased edema  Visit Diagnosis: Chronic pain of left knee - Plan: PT plan of care cert/re-cert  Chronic pain of right knee - Plan: PT plan of care cert/re-cert  Muscle weakness (generalized) - Plan: PT plan of care cert/re-cert      G-Codes - 58/25/18 9842    Functional Assessment Tool Used (Outpatient Only) clinical judgement, 5 times sit<>stand, gait ability;    Functional Limitation Mobility: Walking and moving around   Mobility: Walking and Moving Around Current Status [phone]) At least 20 percent but less than 40 percent impaired, limited or restricted   Mobility: Walking and Moving Around Goal Status [phone]) At least 1 percent but less than 20 percent impaired, limited or restricted       Problem List Patient Active Problem List   Diagnosis Date Noted  . Right lumbar radiculopathy 09/28/2015    Class: Chronic  . Painful orthopaedic hardware ([place]) 09/28/2015    Class: Chronic   Samantha Garcia SPT This entire session was performed under direct supervision and direction of a licensed Chiropractor . I have personally read, edited and approve of the note as written.  Trotter,Margaret PT, DPT 01/07/2017, 8:38 AM  [place] MAIN [facility] SERVICES [address] [place], [place], [address] Phone: [phone]   Fax:  [phone]  Name: Samantha Garcia MRN: [id number] Date of Birth: 05-21-1932

## 2017-01-06 NOTE — Patient Instructions (Addendum)
ABDUCTION: Sitting - Resistance Band (Active)    Sit with feet flat. Lift right leg slightly and, against yellow resistance band, draw it out to side. Complete _2__ sets of _15__ repetitions. Perform _2__ sessions per day.  Copyright  VHI. All rights reserved.

## 2017-01-07 ENCOUNTER — Encounter: Payer: Self-pay | Admitting: Physical Therapy

## 2017-01-08 ENCOUNTER — Ambulatory Visit: Payer: PPO | Admitting: Physical Therapy

## 2017-01-08 ENCOUNTER — Encounter: Payer: Self-pay | Admitting: Physical Therapy

## 2017-01-08 DIAGNOSIS — M25562 Pain in left knee: Secondary | ICD-10-CM | POA: Diagnosis not present

## 2017-01-08 DIAGNOSIS — M25561 Pain in right knee: Secondary | ICD-10-CM

## 2017-01-08 DIAGNOSIS — M6281 Muscle weakness (generalized): Secondary | ICD-10-CM

## 2017-01-08 DIAGNOSIS — G8929 Other chronic pain: Secondary | ICD-10-CM

## 2017-01-08 NOTE — Therapy (Signed)
[place] MAIN [facility] SERVICES [address] [place], [place], [address] Phone: [phone]   Fax:  [phone]  Physical Therapy Treatment  Patient Details  Name: Samantha Garcia MRN: [id number] Date of Birth: [birth year] Referring Provider: Basil Dess  Encounter Date: 01/08/2017      PT End of Session - 01/08/17 1423    Visit Number 2   Number of Visits 16   Date for PT Re-Evaluation 03/03/17   Authorization Type gcode 2   Authorization Time Period 10   PT Start Time 1347   PT Stop Time 1440   PT Time Calculation (min) 53 min   Activity Tolerance Patient tolerated treatment well   Behavior During Therapy [facility] for tasks assessed/performed      Past Medical History:  Diagnosis Date  . Arthritis    B Knee  . Cancer ([place])    Left Breast; 5 years cancer free(7/17)  . Complication of anesthesia    Pt stated it took her a long time to wake up after having Hysterectomy in the 70s, but has not had any issues since  . Dysrhythmia    Atrial Fibrillation  . Hypertension    controlled by medication  . PPD positive 1968    Past Surgical History:  Procedure Laterality Date  . ABDOMINAL HYSTERECTOMY  09/20/75  . APPENDECTOMY    . BACK SURGERY  03/12/10  . BREAST LUMPECTOMY Left   . BREAST SURGERY Left 11/14/10   DCIS carcinoma InSitu   . CARDIAC CATHETERIZATION    . CATARACT EXTRACTION Bilateral   . COLONOSCOPY W/ POLYPECTOMY    . COLPORRHAPHY  02/15/01   Anterior repair   . HARDWARE REMOVAL N/A 09/28/2015   Procedure: REMOVAL OF RIGHT PEDICLE SCREWS AND ROD L3, L4, L5 Wiltse approach;  Surgeon: Jessy Oto, MD;  Location: [place];  Service: Orthopedics;  Laterality: N/A;  . ROTATOR CUFF REPAIR Left 06/15/08    There were no vitals filed for this visit.      Subjective Assessment - 01/08/17 1358    Subjective Pt reports pain with HEP exrecise step up; Pt reoprts knee pain and discomfort into the leg; Pt states she will go to doctor Aug  2nd to get shots for her back pain;    Pertinent History 83 yo Female presents to therapy for bilateral knee osteoarthritis. Pt reports she used to run and had to stop because of knee pain a while ago; Pt has arthritis in knees, back and hands; Pt had back surgery one year ago in April to remove the hardware in low back; the screws had loosened up and was aggravating a nerve; Pt symptoms resolved completely immediately after surgery for 3 months; back pain started coming back July 2017 and the knee arthritis had also further developed; Pt had gotten better in february after therapy for the knees; but the pain and stiffness has increased to where she couldnt get out of bed; takes tramadol 2x day but tries to limit that; Pt has not been able to keep up with HEP from previous therapy and not feeling well; Pt reported a fall about 1 month ago;   Limitations Standing;Walking;Sitting;Lifting;House hold activities   How long can you sit comfortably? couple hours   How long can you stand comfortably? longer with feet apart about 30 min   How long can you walk comfortably? 100 ft   Diagnostic tests xray shows arthritis in bilateral knees;    Patient Stated Goals  climb stairs with no pain;   Currently in Pain? Yes   Pain Score 2    Pain Location Knee   Pain Orientation Right;Left   Pain Descriptors / Indicators Aching;Sore   Pain Type Chronic pain   Pain Radiating Towards isolated into knee   Pain Onset More than a month ago   Pain Frequency Intermittent   Aggravating Factors  sit<>stand, stairs   Pain Relieving Factors rest; heat/ice   Effect of Pain on Daily Activities decreased transfer tolerance;    Multiple Pain Sites No            [facility] PT Assessment - 01/08/17 0001      Observation/Other Assessments   Lower Extremity Functional Scale  56/80 (the lower the score the greater disability)     Circumferential Edema   Circumferential - Right Joint line: 39 cm, 5 cm above: 40 cm, 5 cm below:  36 cm   Circumferential - Left  Joint line: 38cm, 5 cm above: 39cm, 5 cm below: 34.5cm     PT TREATMENT: Pt instructed in LEFS; 56/80 showing moderate discomfort with most daily activities;    Supine;  Patella mobs inferior/superior 2 x 10 sec ea; R patella hypomobile; no pain with mobilizations; Swelling noted in R patella (measurements above)  Strength;  Pt instructed in quad sets 10 ea x 5 sec hold; with pillow under knees;  Pt instructed in SLR;  x 1 incorporating quad set; pt reported shooting increased back pain  Pt instructed in SAQ; 2 x 10 ea;   Hooklying;  Pt instructed in hip abduction and external rotation; combination of isotonics; x 10; PT added manual resistance to movement; Pt fatigued quickly with exercise;  Sidelying;  Pt instructed in clam shells; x 10 ea with red tband R side abduction; pt was unable to do with resistance band; pt required min cuing in order to keep hips stacked optimize hip strengthening;  Pt reported fatigue in muscles after therapy but no increase in pain;                       PT Education - 01/08/17 1412    Education provided Yes   Education Details HEP; Press photographer) Educated Patient   Methods Explanation;Verbal cues;Tactile cues   Comprehension Verbalized understanding;Returned demonstration;Verbal cues required             PT Long Term Goals - 01/08/17 1437      PT LONG TERM GOAL #1   Title Pt will be independent with HEP to improve carryover and increase strength, mobility and gait negotiation;   Time 8   Period Weeks   Status New     PT LONG TERM GOAL #2   Title Pt will decrease the 5 x sit to stand by 30 seconds in order to decrease risk of fall and improve function;   Baseline 68 sec   Time 8   Period Weeks   Status New     PT LONG TERM GOAL #3   Title Pt will negotiate 4, 6" stairs with R rail assist independently with no increase in pain in order to improve ability to get in/out of  home;   Time 8   Period Weeks   Status New     PT LONG TERM GOAL #4   Title Pt will increase LE strength bilaterally to 4+/5 in order to improve sit to stand and improve tolerance to functional activities;   Time  8   Period Weeks   Status New     PT LONG TERM GOAL #5   Title Pt will increase LEFS to 70/80 in order to improve function and daily activities;    Baseline 56   Time 8   Period Weeks   Status New               Plan - 01/08/17 1424    Clinical Impression Statement Pt came to therapy with bilat knee pain R more than L; Pt was instructed in LE strengthening on plinth adding resistance to exercises pt can tolerate; Pt is also limited in activity because of back pain; Pt LEFS shows her decrease in function and limitation in activity; Pt required cuing for proper positioning and muscle activation during exercises; Pt would continue to benefit from skilled PT to improve strength; and gait safety;    Rehab Potential Fair   Clinical Impairments Affecting Rehab Potential negative-chronic, lives alone, back pain, age, fall risk, BLE, not keeping up with HEP from spring; positive- motivation to decrease pain; independent   PT Frequency 2x / week   PT Duration 8 weeks   PT Treatment/Interventions ADLs/Self Care Home Management;Gait training;Stair training;Neuromuscular re-education;Energy conservation;Functional mobility training;Therapeutic activities;Therapeutic exercise;Moist Heat;Manual techniques;Passive range of motion;Balance training;Cryotherapy;Aquatic Therapy;Electrical Stimulation;Ultrasound;Patient/family education;Taping;Dry needling   PT Next Visit Plan patella mobs; strengthening; consider filling out LEFs   PT Home Exercise Plan see pt instructions; adjusted HEP given from orthopedic doctor   Consulted and Agree with Plan of Care Patient      Patient will benefit from skilled therapeutic intervention in order to improve the following deficits and impairments:   Pain, Decreased mobility, Decreased activity tolerance, Decreased endurance, Decreased range of motion, Decreased strength, Difficulty walking, Decreased balance, Hypomobility, Impaired sensation, Increased edema  Visit Diagnosis: Chronic pain of left knee  Chronic pain of right knee  Muscle weakness (generalized)   Problem List Patient Active Problem List   Diagnosis Date Noted  . Right lumbar radiculopathy 09/28/2015    Class: Chronic  . Painful orthopaedic hardware ([place]) 09/28/2015    Class: Chronic   Vielka Klinedinst SPT This entire session was performed under direct supervision and direction of a licensed Chiropractor . I have personally read, edited and approve of the note as written.   Trotter,Margaret PT, DPT 01/08/2017, 4:09 PM  [place] MAIN [facility] SERVICES [address] [place], [place], [address] Phone: [phone]   Fax:  [phone]  Name: RAGENA FIOLA MRN: [id number] Date of Birth: 05/01/1932

## 2017-01-12 ENCOUNTER — Ambulatory Visit: Payer: PPO | Admitting: Physical Therapy

## 2017-01-12 ENCOUNTER — Encounter: Payer: Self-pay | Admitting: Physical Therapy

## 2017-01-12 DIAGNOSIS — M25562 Pain in left knee: Secondary | ICD-10-CM | POA: Diagnosis not present

## 2017-01-12 DIAGNOSIS — M6281 Muscle weakness (generalized): Secondary | ICD-10-CM

## 2017-01-12 DIAGNOSIS — G8929 Other chronic pain: Secondary | ICD-10-CM

## 2017-01-12 DIAGNOSIS — M25561 Pain in right knee: Secondary | ICD-10-CM

## 2017-01-12 NOTE — Therapy (Signed)
[place] MAIN [facility] SERVICES [address] [place], [place], [address] Phone: [phone]   Fax:  [phone]  Physical Therapy Treatment  Patient Details  Name: Samantha Garcia MRN: [id number] Date of Birth: 01-Aug-1931 Referring Provider: Basil Dess  Encounter Date: 01/12/2017      PT End of Session - 01/12/17 1111    Visit Number 3   Number of Visits 16   Date for PT Re-Evaluation 03/03/17   Authorization Type gcode [id number]   Authorization Time Period 10   PT Start Time 1115   PT Stop Time 1200   PT Time Calculation (min) 45 min   Activity Tolerance Patient tolerated treatment well   Behavior During Therapy [facility] for tasks assessed/performed      Past Medical History:  Diagnosis Date  . Arthritis    B Knee  . Cancer ([place])    Left Breast; 5 years cancer free(7/17)  . Complication of anesthesia    Pt stated it took her a long time to wake up after having Hysterectomy in the 70s, but has not had any issues since  . Dysrhythmia    Atrial Fibrillation  . Hypertension    controlled by medication  . PPD positive 1968    Past Surgical History:  Procedure Laterality Date  . ABDOMINAL HYSTERECTOMY  09/20/75  . APPENDECTOMY    . BACK SURGERY  03/12/10  . BREAST LUMPECTOMY Left   . BREAST SURGERY Left 11/14/10   DCIS carcinoma InSitu   . CARDIAC CATHETERIZATION    . CATARACT EXTRACTION Bilateral   . COLONOSCOPY W/ POLYPECTOMY    . COLPORRHAPHY  02/15/01   Anterior repair   . HARDWARE REMOVAL N/A 09/28/2015   Procedure: REMOVAL OF RIGHT PEDICLE SCREWS AND ROD L3, L4, L5 [place]iltse approach;  Surgeon: Jessy Oto, MD;  Location: [place];  Service: Orthopedics;  Laterality: N/A;  . ROTATOR CUFF REPAIR Left 06/15/08    There were no vitals filed for this visit.      Subjective Assessment - 01/12/17 1110    Subjective Pt reported soreness after last treatment; Pt started getting symptoms of a cold and has been in bed a lot; pt states the  weather has increased the back and knee pain and made it almost impossible to go on stairs    Pertinent History 83 yo Female presents to therapy for bilateral knee osteoarthritis. Pt reports she used to run and had to stop because of knee pain a while ago; Pt has arthritis in knees, back and hands; Pt had back surgery one year ago in April to remove the hardware in low back; the screws had loosened up and was aggravating a nerve; Pt symptoms resolved completely immediately after surgery for 3 months; back pain started coming back July 2017 and the knee arthritis had also further developed; Pt had gotten better in february after therapy for the knees; but the pain and stiffness has increased to where she couldnt get out of bed; takes tramadol 2x day but tries to limit that; Pt has not been able to keep up with HEP from previous therapy and not feeling well; Pt reported a fall about 1 month ago;   Limitations Standing;Walking;Sitting;Lifting;House hold activities   How long can you sit comfortably? couple hours   How long can you stand comfortably? longer with feet apart about 30 min   How long can you walk comfortably? 100 ft   Diagnostic tests xray shows arthritis in bilateral  knees;    Patient Stated Goals climb stairs with no pain;   Currently in Pain? Yes   Pain Score 5    Pain Location Knee   Pain Orientation Left;Right   Pain Descriptors / Indicators Throbbing   Pain Type Chronic pain   Pain Onset More than a month ago   Pain Frequency Intermittent   Aggravating Factors  acitivity    Pain Relieving Factors rest and heat   Effect of Pain on Daily Activities decrease transfer tolerance    Multiple Pain Sites Yes   Pain Score 8   Pain Location Back   Pain Type Chronic pain   Pain Onset More than a month ago   Pain Frequency Intermittent        PT TREATMENT;  Hooklying; applied heat for 10 min to BLE; also applied heat to low back; (unbilled)  Supine; R patella inferior and  superior mobs 3 x 15 sec ea Grade IV; to increase patella mobility  Seated on plinth with LE off table; distraction 3 x 30 sec ea Pt instructed in LAQ x 15 on R; Pt had too much pain at end range on L to complete exercise; cue to contract quad through out exercise; Applied AP and PA mobs seated x 20 sec ea Grade III; Pt attempted LAQ again on L and was able to complete 10 with no pain; Pt instructed in hip abduction with red tband resistance; x15 ea; Pt reported feeling hip abductors toward the end of the set;  Pt instructed in seated alt marches with red tband with resistance; x 15 ea; pt starts to compensate with back extension when fatiguing; tactile cue to flex hip up to PTs hands about 6 inches above knee;   Hooklying; Bridge BLE; x 3 pt experienced cramping in R LE; rest 1 min tried again and cramped again Pt instructed in hip abduction and external rotation; combination of isotonics; x 10; PT added manual resistance to movement  Supine; Pt instructed in straight leg hip abduction; x 10; pt required min cues to keep leg straight throughout entire movement and not externally rotate the foot;    Standing; Pt instructed in hip extension; with red tband; Pt reported feeling exercise on stance leg and leg in hip extension leg; x 10 ea   Pt tolerated treatment well and felt as though she could walk better after therapy today;                            PT Education - 01/12/17 1110    Education provided Yes   Education Details HEP and strengthening   Person(s) Educated Patient   Methods Explanation;Verbal cues   Comprehension Verbalized understanding;Returned demonstration;Verbal cues required             PT Long Term Goals - 01/08/17 1437      PT LONG TERM GOAL #1   Title Pt will be independent with HEP to improve carryover and increase strength, mobility and gait negotiation;   Time 8   Period Weeks   Status New     PT LONG TERM GOAL #2   Title Pt  will decrease the 5 x sit to stand by 30 seconds in order to decrease risk of fall and improve function;   Baseline 68 sec   Time 8   Period Weeks   Status New     PT LONG TERM GOAL #3   Title Pt will  negotiate 4, 6" stairs with R rail assist independently with no increase in pain in order to improve ability to get in/out of home;   Time 8   Period Weeks   Status New     PT LONG TERM GOAL #4   Title Pt will increase LE strength bilaterally to 4+/5 in order to improve sit to stand and improve tolerance to functional activities;   Time 8   Period Weeks   Status New     PT LONG TERM GOAL #5   Title Pt will increase LEFS to 70/80 in order to improve function and daily activities;    Baseline 56   Time 8   Period Weeks   Status New               Plan - 01/12/17 1202    Clinical Impression Statement Pt came to therapy with increased back and knee pain today; Began treatment with hot packs on knee and back to help relax and loosen pt up; followed by some distraction and mobs to the knee joint; Pt demonstrated decreased pain and more motion in knee during exercises after mobilization; Pt continued strengthening exercises in supine, seated and standing; Pt back limits some activity but was able to complete hip extension today in standing with added resistance and felt exercise in stance and working LE; Pt required min-mod verbal cues and tactile cues in order for proper positoning and muscle activation for exercise; Pt would continue to benefit from skilled PT in order to increase strength and transfers in LE;    Rehab Potential Fair   Clinical Impairments Affecting Rehab Potential negative-chronic, lives alone, back pain, age, fall risk, BLE, not keeping up with HEP from spring; positive- motivation to decrease pain; independent   PT Frequency 2x / week   PT Duration 8 weeks   PT Treatment/Interventions ADLs/Self Care Home Management;Gait training;Stair training;Neuromuscular  re-education;Energy conservation;Functional mobility training;Therapeutic activities;Therapeutic exercise;Moist Heat;Manual techniques;Passive range of motion;Balance training;Cryotherapy;Aquatic Therapy;Electrical Stimulation;Ultrasound;Patient/family education;Taping;Dry needling   PT Next Visit Plan patella mobs; strengthening; consider filling out LEFs   PT Home Exercise Plan see pt instructions; adjusted HEP given from orthopedic doctor   Consulted and Agree with Plan of Care Patient      Patient will benefit from skilled therapeutic intervention in order to improve the following deficits and impairments:  Pain, Decreased mobility, Decreased activity tolerance, Decreased endurance, Decreased range of motion, Decreased strength, Difficulty walking, Decreased balance, Hypomobility, Impaired sensation, Increased edema  Visit Diagnosis: Chronic pain of left knee  Chronic pain of right knee  Muscle weakness (generalized)     Problem List Patient Active Problem List   Diagnosis Date Noted  . Right lumbar radiculopathy 09/28/2015    Class: Chronic  . Painful orthopaedic hardware ([place]) 09/28/2015    Class: Chronic   Jobe Mutch SPT This entire session was performed under direct supervision and direction of a licensed Chiropractor . I have personally read, edited and approve of the note as written.  Trotter,Margaret PT, DPT 01/12/2017, 1:49 PM  [place] MAIN [facility] SERVICES [address] [place], [place], [address] Phone: [phone]   Fax:  [phone]  Name: Samantha Garcia MRN: [id number] Date of Birth: 1932-04-16

## 2017-01-13 ENCOUNTER — Encounter: Payer: PPO | Admitting: Physical Therapy

## 2017-01-15 ENCOUNTER — Ambulatory Visit: Payer: PPO | Admitting: Physical Therapy

## 2017-01-15 ENCOUNTER — Encounter: Payer: PPO | Admitting: Physical Therapy

## 2017-01-15 ENCOUNTER — Encounter: Payer: Self-pay | Admitting: Physical Therapy

## 2017-01-15 DIAGNOSIS — G8929 Other chronic pain: Secondary | ICD-10-CM

## 2017-01-15 DIAGNOSIS — M25562 Pain in left knee: Secondary | ICD-10-CM | POA: Diagnosis not present

## 2017-01-15 DIAGNOSIS — M25561 Pain in right knee: Secondary | ICD-10-CM

## 2017-01-15 DIAGNOSIS — M6281 Muscle weakness (generalized): Secondary | ICD-10-CM

## 2017-01-15 NOTE — Patient Instructions (Signed)
   HAMSTRING STRETCH Lie on back with one knee bent, foot flat on floor. Hook strap around other foot. Straighten knee. Keep knee level with other knee. Hold _30__ seconds. Relax leg completely down to floor.  Repeat _3__ times per session. Do _2__ sessions per day.

## 2017-01-15 NOTE — Therapy (Signed)
[place] MAIN [facility] SERVICES [address] [place], [place], [address] Phone: [phone]   Fax:  [phone]  Physical Therapy Treatment  Patient Details  Name: Samantha Garcia MRN: [id number] Date of Birth: [birth year] Referring Provider: Basil Dess  Encounter Date: 01/15/2017      PT End of Session - 01/15/17 1315    Visit Number 4   Number of Visits 16   Date for PT Re-Evaluation 03/03/17   Authorization Type gcode 4   Authorization Time Period 10   PT Start Time 1301   PT Stop Time 1345   PT Time Calculation (min) 44 min   Activity Tolerance Patient tolerated treatment well   Behavior During Therapy [facility] for tasks assessed/performed      Past Medical History:  Diagnosis Date  . Arthritis    B Knee  . Cancer ([place])    Left Breast; 5 years cancer free(7/17)  . Complication of anesthesia    Pt stated it took her a long time to wake up after having Hysterectomy in the 70s, but has not had any issues since  . Dysrhythmia    Atrial Fibrillation  . Hypertension    controlled by medication  . PPD positive 1968    Past Surgical History:  Procedure Laterality Date  . ABDOMINAL HYSTERECTOMY  09/20/75  . APPENDECTOMY    . BACK SURGERY  03/12/10  . BREAST LUMPECTOMY Left   . BREAST SURGERY Left 11/14/10   DCIS carcinoma InSitu   . CARDIAC CATHETERIZATION    . CATARACT EXTRACTION Bilateral   . COLONOSCOPY W/ POLYPECTOMY    . COLPORRHAPHY  02/15/01   Anterior repair   . HARDWARE REMOVAL N/A 09/28/2015   Procedure: REMOVAL OF RIGHT PEDICLE SCREWS AND ROD L3, L4, L5 [place]se approach;  Surgeon: Jessy Oto, MD;  Location: [place];  Service: Orthopedics;  Laterality: N/A;  . ROTATOR CUFF REPAIR Left 06/15/08    There were no vitals filed for this visit.      Subjective Assessment - 01/15/17 1311    Subjective Pt reported after last treatment that she could do way more household activities; Pt states not having been able to do some  things in a while with suck relief; Pt reports that thepain comes on more frequently at night when going to sleep;   Pertinent History 83 yo Female presents to therapy for bilateral knee osteoarthritis. Pt reports she used to run and had to stop because of knee pain a while ago; Pt has arthritis in knees, back and hands; Pt had back surgery one year ago in April to remove the hardware in low back; the screws had loosened up and was aggravating a nerve; Pt symptoms resolved completely immediately after surgery for 3 months; back pain started coming back July 2017 and the knee arthritis had also further developed; Pt had gotten better in february after therapy for the knees; but the pain and stiffness has increased to where she couldnt get out of bed; takes tramadol 2x day but tries to limit that; Pt has not been able to keep up with HEP from previous therapy and not feeling well; Pt reported a fall about 1 month ago;   Limitations Standing;Walking;Sitting;Lifting;House hold activities   How long can you sit comfortably? couple hours   How long can you stand comfortably? longer with feet apart about 30 min   How long can you walk comfortably? 100 ft   Diagnostic tests xray shows arthritis  in bilateral knees;    Patient Stated Goals climb stairs with no pain;   Currently in Pain? No/denies   Pain Onset More than a month ago   Pain Onset More than a month ago        PT TREATMENT;   Hooklying; applied heat for 5 min to BLE; also applied heat to low back; (unbilled)  Supine; R patella inferior and superior mobs 3 x 15 sec ea Grade IV; to increase patella mobility  Seated on plinth with LE off table;  distraction 3 x 30 sec ea  AP and PA mobs Grade III 2 x 30 sec ea direction  Pt demonstrates more pain free movement after mobilization;  LAQ 2 x 15 ea; pt cued for slow movement in order to get more muscle activation;    Supine;  quad sets x 10 ea. 5 sec hold; pillow underneath knee; isometric  hamstring set x 15 ea 5 sec hold;  SAQ 2 x 10 ea; bolster under thigh; pt reported increase in pain in L LE during exercise; PT applied 3 min of soft tissue to glute med area and pt reported relief with SAQ;  Patient required min-moderate verbal/tactile cues for correct exercise technique.  hooklying;  adduction with pillow between legs 2 x 10 with 5 sec hold; Pt required min verbal cues for positioning to keep hips and back on table during exercise;   Pt tolerated treatment well with no increase in pain at end of treatment;                           PT Education - 01/15/17 1312    Education provided Yes   Education Details HEP; strengthening; heat/ice   Person(s) Educated Patient   Methods Explanation;Verbal cues   Comprehension Verbalized understanding;Returned demonstration;Verbal cues required             PT Long Term Goals - 01/08/17 1437      PT LONG TERM GOAL #1   Title Pt will be independent with HEP to improve carryover and increase strength, mobility and gait negotiation;   Time 8   Period Weeks   Status New     PT LONG TERM GOAL #2   Title Pt will decrease the 5 x sit to stand by 30 seconds in order to decrease risk of fall and improve function;   Baseline 68 sec   Time 8   Period Weeks   Status New     PT LONG TERM GOAL #3   Title Pt will negotiate 4, 6" stairs with R rail assist independently with no increase in pain in order to improve ability to get in/out of home;   Time 8   Period Weeks   Status New     PT LONG TERM GOAL #4   Title Pt will increase LE strength bilaterally to 4+/5 in order to improve sit to stand and improve tolerance to functional activities;   Time 8   Period Weeks   Status New     PT LONG TERM GOAL #5   Title Pt will increase LEFS to 70/80 in order to improve function and daily activities;    Baseline 56   Time 8   Period Weeks   Status New               Plan - 01/15/17 1315    Clinical  Impression Statement Pt came to therapy today with no pain  and has demonstrated progress with activity tolerance at home; PT applied mobs Grade III and IV to the knee after mobs the pt felt as though knee could more easily; Pt was instructed in strengthening exercises in the pain free ROM gained from mobilizations and isometrics strengthening as well; Pt is still weak in quads and hamstrings and shows fatigue after a few sets of exercise; Pt required min cuing for positioning during exercises for better muscle activation and strengthening; Pt was instructed in self soft tissue massage to glute med area to reduce some tension and pain in leg; Pt was also instructed in hamstring stretch for home in order to decrease tightness felt in hamstring and decrease some back pain; Pt would continue to benefit from skilled PT in order to improve LE strengthening;    Rehab Potential Fair   Clinical Impairments Affecting Rehab Potential negative-chronic, lives alone, back pain, age, fall risk, BLE, not keeping up with HEP from spring; positive- motivation to decrease pain; independent   PT Frequency 2x / week   PT Duration 8 weeks   PT Treatment/Interventions ADLs/Self Care Home Management;Gait training;Stair training;Neuromuscular re-education;Energy conservation;Functional mobility training;Therapeutic activities;Therapeutic exercise;Moist Heat;Manual techniques;Passive range of motion;Balance training;Cryotherapy;Aquatic Therapy;Electrical Stimulation;Ultrasound;Patient/family education;Taping;Dry needling   PT Next Visit Plan patella mobs; strengthening; consider filling out LEFs   PT Home Exercise Plan see pt instructions; adjusted HEP given from orthopedic doctor   Consulted and Agree with Plan of Care Patient      Patient will benefit from skilled therapeutic intervention in order to improve the following deficits and impairments:  Pain, Decreased mobility, Decreased activity tolerance, Decreased endurance,  Decreased range of motion, Decreased strength, Difficulty walking, Decreased balance, Hypomobility, Impaired sensation, Increased edema  Visit Diagnosis: Chronic pain of left knee  Chronic pain of right knee  Muscle weakness (generalized)     Problem List Patient Active Problem List   Diagnosis Date Noted  . Right lumbar radiculopathy 09/28/2015    Class: Chronic  . Painful orthopaedic hardware ([place]) 09/28/2015    Class: Chronic   Hiral Lukasiewicz SPT This entire session was performed under direct supervision and direction of a licensed Chiropractor . I have personally read, edited and approve of the note as written.  Trotter,Margaret PT, DPT 01/15/2017, 3:21 PM  [place] MAIN [facility] SERVICES [address] [place], [place], [address] Phone: [phone]   Fax:  [phone]  Name: Samantha Garcia MRN: [id number] Date of Birth: [birth year]

## 2017-01-19 ENCOUNTER — Ambulatory Visit: Payer: PPO | Admitting: Physical Therapy

## 2017-01-19 ENCOUNTER — Encounter: Payer: Self-pay | Admitting: Physical Therapy

## 2017-01-19 DIAGNOSIS — M25562 Pain in left knee: Secondary | ICD-10-CM | POA: Diagnosis not present

## 2017-01-19 DIAGNOSIS — G8929 Other chronic pain: Secondary | ICD-10-CM

## 2017-01-19 DIAGNOSIS — M25561 Pain in right knee: Secondary | ICD-10-CM

## 2017-01-19 DIAGNOSIS — M6281 Muscle weakness (generalized): Secondary | ICD-10-CM

## 2017-01-19 NOTE — Therapy (Signed)
[place] MAIN [facility] SERVICES [address] [place], [place], [address] Phone: [phone]   Fax:  [phone]  Physical Therapy Treatment  Patient Details  Name: Samantha Garcia MRN: [id number] Date of Birth: 09/01/1931 Referring Provider: Basil Dess  Encounter Date: 01/19/2017      PT End of Session - 01/19/17 1453    Visit Number 5   Number of Visits 16   Date for PT Re-Evaluation 03/03/17   Authorization Type gcode [id number]    Authorization Time Period 10   PT Start Time 1430   PT Stop Time 1515   PT Time Calculation (min) 45 min   Activity Tolerance Patient tolerated treatment well;No increased pain   Behavior During Therapy WFL for tasks assessed/performed      Past Medical History:  Diagnosis Date  . Arthritis    B Knee  . Cancer ([place])    Left Breast; 5 years cancer free(7/17)  . Complication of anesthesia    Pt stated it took her a long time to wake up after having Hysterectomy in the 70s, but has not had any issues since  . Dysrhythmia    Atrial Fibrillation  . Hypertension    controlled by medication  . PPD positive 1968    Past Surgical History:  Procedure Laterality Date  . ABDOMINAL HYSTERECTOMY  09/20/75  . APPENDECTOMY    . BACK SURGERY  03/12/10  . BREAST LUMPECTOMY Left   . BREAST SURGERY Left 11/14/10   DCIS carcinoma InSitu   . CARDIAC CATHETERIZATION    . CATARACT EXTRACTION Bilateral   . COLONOSCOPY W/ POLYPECTOMY    . COLPORRHAPHY  02/15/01   Anterior repair   . HARDWARE REMOVAL N/A 09/28/2015   Procedure: REMOVAL OF RIGHT PEDICLE SCREWS AND ROD L3, L4, L5 [place] approach;  Surgeon: Jessy Oto, MD;  Location: [place];  Service: Orthopedics;  Laterality: N/A;  . ROTATOR CUFF REPAIR Left 06/15/08    There were no vitals filed for this visit.      Subjective Assessment - 01/19/17 1432    Subjective Pt reports feeling sleepy and tired that she doesnt know how her knees feel; When she does go out she states  she forgets all about her knees; Pt states she was able to get out of a chair with no arm rests for the first time this weekend;    Pertinent History 83 yo Female presents to therapy for bilateral knee osteoarthritis. Pt reports she used to run and had to stop because of knee pain a while ago; Pt has arthritis in knees, back and hands; Pt had back surgery one year ago in April to remove the hardware in low back; the screws had loosened up and was aggravating a nerve; Pt symptoms resolved completely immediately after surgery for 3 months; back pain started coming back July 2017 and the knee arthritis had also further developed; Pt had gotten better in february after therapy for the knees; but the pain and stiffness has increased to where she couldnt get out of bed; takes tramadol 2x day but tries to limit that; Pt has not been able to keep up with HEP from previous therapy and not feeling well; Pt reported a fall about 1 month ago;   Limitations Standing;Walking;Sitting;Lifting;House hold activities   How long can you sit comfortably? couple hours   How long can you stand comfortably? longer with feet apart about 30 min   How long can you walk comfortably?  100 ft   Diagnostic tests xray shows arthritis in bilateral knees;    Patient Stated Goals climb stairs with no pain;   Currently in Pain? No/denies   Pain Onset More than a month ago   Pain Onset More than a month ago      PT TREATMENT;  Hooklying; applied heat for 5 min to BLE; also applied heat to low back; (unbilled)  Seated on plinth with LE off table;  distraction 3 x 30 sec ea  AP and PA mobs Grade IV 2 x 30 sec ea direction  Pt demonstrates more pain free movement after mobilization with RLE;  LAQ 2 x 10  ea; pt cued for slow movement in order to get more muscle activation; Pt had difficulty with L LE painful on the way down in back of knee;  Hamstring curl with yellow tband resistance; 2 x 15 ea ; no increase in pain in either  LE  Supine; SLR 2 x 10 ea leg; Pt has more difficulty with L LE than R; but no increase in pain;  Patient required min-moderate verbal/tactile cues for correct exercise technique  Sidelying; Clam shell with yellow tband resistance; x 15 ea; pt required min cues for positioning and form with exercise;   Heel Raises 2 x 15 with upper extremity support; no reports of pain  Wall slides x 5; pt able to go grossly 15 degrees without pain;       Pt instructed on SLR and added to HEP; PT discussed aquatic therapy which pt has lots of interest in pool therapy;                              PT Education - 01/19/17 1442    Education provided Yes   Education Details Strengthening and pool therapy   Person(s) Educated Patient   Methods Explanation;Verbal cues;Tactile cues   Comprehension Verbalized understanding;Returned demonstration;Verbal cues required             PT Long Term Goals - 01/08/17 1437      PT LONG TERM GOAL #1   Title Pt will be independent with HEP to improve carryover and increase strength, mobility and gait negotiation;   Time 8   Period Weeks   Status New     PT LONG TERM GOAL #2   Title Pt will decrease the 5 x sit to stand by 30 seconds in order to decrease risk of fall and improve function;   Baseline 68 sec   Time 8   Period Weeks   Status New     PT LONG TERM GOAL #3   Title Pt will negotiate 4, 6" stairs with R rail assist independently with no increase in pain in order to improve ability to get in/out of home;   Time 8   Period Weeks   Status New     PT LONG TERM GOAL #4   Title Pt will increase LE strength bilaterally to 4+/5 in order to improve sit to stand and improve tolerance to functional activities;   Time 8   Period Weeks   Status New     PT LONG TERM GOAL #5   Title Pt will increase LEFS to 70/80 in order to improve function and daily activities;    Baseline 56   Time 8   Period Weeks   Status New  Plan - 01/19/17 1443    Clinical Impression Statement Pt came to therapy with no pain today; Pt right knee that she fell on earlier this year is feeling much better; Pt's left knee is more bothersome and limited in ROM by pain; Pt was able to tolerate Grade IV mobs to the knee with no pain; Pt was instructed in progressive LE strengthening exercises; Pt was able to do SLR which she was unable to do at first because of pain; Pt was also able to do clamshells with resistance today with no pain which she could not previously do; Pt overall is improving and able to do more activities with less pain; Pt was also educated on aquatic therapy in the future; Pt would continue to benefit from skilled PT in order to improve LE strength;    Rehab Potential Fair   Clinical Impairments Affecting Rehab Potential negative-chronic, lives alone, back pain, age, fall risk, BLE, not keeping up with HEP from spring; positive- motivation to decrease pain; independent   PT Frequency 2x / week   PT Duration 8 weeks   PT Treatment/Interventions ADLs/Self Care Home Management;Gait training;Stair training;Neuromuscular re-education;Energy conservation;Functional mobility training;Therapeutic activities;Therapeutic exercise;Moist Heat;Manual techniques;Passive range of motion;Balance training;Cryotherapy;Aquatic Therapy;Electrical Stimulation;Ultrasound;Patient/family education;Taping;Dry needling   PT Next Visit Plan patella mobs; strengthening; consider filling out LEFs   PT Home Exercise Plan see pt instructions; adjusted HEP given from orthopedic doctor   Consulted and Agree with Plan of Care Patient      Patient will benefit from skilled therapeutic intervention in order to improve the following deficits and impairments:  Pain, Decreased mobility, Decreased activity tolerance, Decreased endurance, Decreased range of motion, Decreased strength, Difficulty walking, Decreased balance, Hypomobility, Impaired  sensation, Increased edema  Visit Diagnosis: Chronic pain of left knee  Chronic pain of right knee  Muscle weakness (generalized)     Problem List Patient Active Problem List   Diagnosis Date Noted  . Right lumbar radiculopathy 09/28/2015    Class: Chronic  . Painful orthopaedic hardware ([place]) 09/28/2015    Class: Chronic   Doreene Nest, SPT This entire session was performed under direct supervision and direction of a licensed therapist/therapist assistant . I have personally read, edited and approve of the note as written.   Trotter,Margaret PT, DPT 01/19/2017, 4:36 PM  [place] MAIN [facility] SERVICES [address] [place], [place], [address] Phone: [phone]   Fax:  [phone]  Name: ALIYAH ABEYTA MRN: [id number] Date of Birth: 1931/10/19

## 2017-01-21 ENCOUNTER — Encounter: Payer: Self-pay | Admitting: Physical Therapy

## 2017-01-21 ENCOUNTER — Ambulatory Visit: Payer: PPO | Attending: Specialist | Admitting: Physical Therapy

## 2017-01-21 DIAGNOSIS — M6281 Muscle weakness (generalized): Secondary | ICD-10-CM | POA: Insufficient documentation

## 2017-01-21 DIAGNOSIS — M25561 Pain in right knee: Secondary | ICD-10-CM | POA: Insufficient documentation

## 2017-01-21 DIAGNOSIS — M25562 Pain in left knee: Secondary | ICD-10-CM | POA: Insufficient documentation

## 2017-01-21 DIAGNOSIS — G8929 Other chronic pain: Secondary | ICD-10-CM | POA: Insufficient documentation

## 2017-01-21 NOTE — Therapy (Signed)
[place] [facility] SERVICES [address] [place], [place], [address] Phone: [phone]   Fax:  [phone]  Physical Therapy Treatment  Patient Details  Name: Samantha Garcia MRN: [id number] Date of Birth: 09-Oct-1931 Referring Provider: Basil Dess  Encounter Date: 01/21/2017      PT End of Session - 01/21/17 1441    Visit Number 6   Number of Visits 16   Date for PT Re-Evaluation 03/03/17   Authorization Type gcode 6   Authorization Time Period 10   PT Start Time 1430   PT Stop Time 1525   PT Time Calculation (min) 55 min   Activity Tolerance Patient tolerated treatment well;No increased pain   Behavior During Therapy WFL for tasks assessed/performed      Past Medical History:  Diagnosis Date  . Arthritis    B Knee  . Cancer ([place])    Left Breast; 5 years cancer free(7/17)  . Complication of anesthesia    Pt stated it took her a long time to wake up after having Hysterectomy in the 70s, but has not had any issues since  . Dysrhythmia    Atrial Fibrillation  . Hypertension    controlled by medication  . PPD positive 1968    Past Surgical History:  Procedure Laterality Date  . ABDOMINAL HYSTERECTOMY  09/20/75  . APPENDECTOMY    . BACK SURGERY  03/12/10  . BREAST LUMPECTOMY Left   . BREAST SURGERY Left 11/14/10   DCIS carcinoma InSitu   . CARDIAC CATHETERIZATION    . CATARACT EXTRACTION Bilateral   . COLONOSCOPY W/ POLYPECTOMY    . COLPORRHAPHY  02/15/01   Anterior repair   . HARDWARE REMOVAL N/A 09/28/2015   Procedure: REMOVAL OF RIGHT PEDICLE SCREWS AND ROD L3, L4, L5 [place]se approach;  Surgeon: Jessy Oto, MD;  Location: [place];  Service: Orthopedics;  Laterality: N/A;  . ROTATOR CUFF REPAIR Left 06/15/08    There were no vitals filed for this visit.      Subjective Assessment - 01/21/17 1438    Subjective Pt reports that she is feeling some increased back and knee pain due to the rainy weather. She reports no pain  at this time due to having taken a tramadol prior to therapy. She reports that she will be getting sterior injections for her back pain tomorrow. Pt is compliant with HEP, although she has been having increased difficulty with raising LLE during SLR exercise compared to RLE.    Pertinent History 83 yo Female presents to therapy for bilateral knee osteoarthritis. Pt reports she used to run and had to stop because of knee pain a while ago; Pt has arthritis in knees, back and hands; Pt had back surgery one year ago in April to remove the hardware in low back; the screws had loosened up and was aggravating a nerve; Pt symptoms resolved completely immediately after surgery for 3 months; back pain started coming back July 2017 and the knee arthritis had also further developed; Pt had gotten better in february after therapy for the knees; but the pain and stiffness has increased to where she couldnt get out of bed; takes tramadol 2x day but tries to limit that; Pt has not been able to keep up with HEP from previous therapy and not feeling well; Pt reported a fall about 1 month ago;   Limitations Standing;Walking;Sitting;Lifting;House hold activities   How long can you sit comfortably? couple hours   How long  can you stand comfortably? longer with feet apart about 30 min   How long can you walk comfortably? 100 ft   Diagnostic tests xray shows arthritis in bilateral knees;    Patient Stated Goals climb stairs with no pain;   Currently in Pain? No/denies   Pain Onset More than a month ago   Pain Onset More than a month ago        PT TREATMENT;  Hooklying; applied heat for 5 min to BLE; also applied heat to low back; (unbilled) AP and PA mobs to tibia on femur in supine Grade IV 2 x 30 sec ea direction, bilaterally;   Seated on plinth with LE off table;  Gentle distraction to knee 3 x 30 sec ea  Pt demonstrates more pain free movement after mobilization with LLE LAQ 2 x 10  ea; pt cued for slow  movement in order to get more muscle activation; Pt had difficulty with pain in the L LE on the eccentric lowering from extension of knee, which decreased after several repetitious.   Hamstring curl with yellow tband resistance; 2 x 15 ea ; no increase in pain in either LE, pt cued for increased eccentric control and increased flexion AROM.   LAQ with 2# ankle weight 1 x 15 each side; pt reported no increase in pain  Seated clam shell with yellow tband resistance; 2 x 15 in with cues for technique and slower motion.   Seated march with yellow t-band resistance x 15 each side with cues for slower eccentric motion      Pt instructed on seated march and resisted LAQ in sitting with both added to HEP;          PT Long Term Goals - 01/08/17 1437      PT LONG TERM GOAL #1   Title Pt will be independent with HEP to improve carryover and increase strength, mobility and gait negotiation;   Time 8   Period Weeks   Status New     PT LONG TERM GOAL #2   Title Pt will decrease the 5 x sit to stand by 30 seconds in order to decrease risk of fall and improve function;   Baseline 68 sec   Time 8   Period Weeks   Status New     PT LONG TERM GOAL #3   Title Pt will negotiate 4, 6" stairs with R rail assist independently with no increase in pain in order to improve ability to get in/out of home;   Time 8   Period Weeks   Status New     PT LONG TERM GOAL #4   Title Pt will increase LE strength bilaterally to 4+/5 in order to improve sit to stand and improve tolerance to functional activities;   Time 8   Period Weeks   Status New     PT LONG TERM GOAL #5   Title Pt will increase LEFS to 70/80 in order to improve function and daily activities;    Baseline 56   Time 8   Period Weeks   Status New               Plan - 01/21/17 1528    Clinical Impression Statement Pt benefitted from manual therapy followed by ther-ex to increase LE strength and stability in order to  decrease pt's knee pain. Pt responded well with no pain at rest; she had initial pain in the left knee with extension, however after several repetitions  of AROM the pain had decreased and the pt was able to participate in resisted exercise without pain. Pt expressed that she has been having more energy throughout the day since she started exercising. Pt continues to express interest in resuming aquatic therapy; scheduling has been contacted about setting an appointment for follow-up. Pt will benefit from continued skilled therapy in order to maximize safety and functional independence with mobility and ADLs/IADLs.    Rehab Potential Fair   Clinical Impairments Affecting Rehab Potential negative-chronic, lives alone, back pain, age, fall risk, BLE, not keeping up with HEP from spring; positive- motivation to decrease pain; independent   PT Frequency 2x / week   PT Duration 8 weeks   PT Treatment/Interventions ADLs/Self Care Home Management;Gait training;Stair training;Neuromuscular re-education;Energy conservation;Functional mobility training;Therapeutic activities;Therapeutic exercise;Moist Heat;Manual techniques;Passive range of motion;Balance training;Cryotherapy;Aquatic Therapy;Electrical Stimulation;Ultrasound;Patient/family education;Taping;Dry needling   PT Next Visit Plan patella mobs; strengthening; consider filling out LEFs   PT Home Exercise Plan see pt instructions; adjusted HEP given from orthopedic doctor   Consulted and Agree with Plan of Care Patient      Patient will benefit from skilled therapeutic intervention in order to improve the following deficits and impairments:  Pain, Decreased mobility, Decreased activity tolerance, Decreased endurance, Decreased range of motion, Decreased strength, Difficulty walking, Decreased balance, Hypomobility, Impaired sensation, Increased edema  Visit Diagnosis: Chronic pain of left knee  Chronic pain of right knee  Muscle weakness  (generalized)     Problem List Patient Active Problem List   Diagnosis Date Noted  . Right lumbar radiculopathy 09/28/2015    Class: Chronic  . Painful orthopaedic hardware ([place]) 09/28/2015    Class: Chronic   Samantha Garcia, SPT This entire session was performed under direct supervision and direction of a licensed therapist/therapist assistant . I have personally read, edited and approve of the note as written.  Trotter,Margaret PT, DPT 01/21/2017, 3:43 PM  [place] MAIN [facility] SERVICES [address] [place], [place], [address] Phone: [phone]   Fax:  [phone]  Name: Samantha Garcia MRN: [id number] Date of Birth: Mar 06, 1932

## 2017-01-22 ENCOUNTER — Encounter (INDEPENDENT_AMBULATORY_CARE_PROVIDER_SITE_OTHER): Payer: Self-pay | Admitting: Physical Medicine and Rehabilitation

## 2017-01-22 ENCOUNTER — Ambulatory Visit (INDEPENDENT_AMBULATORY_CARE_PROVIDER_SITE_OTHER): Payer: PPO | Admitting: Physical Medicine and Rehabilitation

## 2017-01-22 ENCOUNTER — Ambulatory Visit (INDEPENDENT_AMBULATORY_CARE_PROVIDER_SITE_OTHER): Payer: Self-pay

## 2017-01-22 VITALS — HR 65

## 2017-01-22 DIAGNOSIS — M5416 Radiculopathy, lumbar region: Secondary | ICD-10-CM

## 2017-01-22 MED ORDER — LIDOCAINE HCL (PF) 1 % IJ SOLN: 2.0000 mL | Freq: Once | INTRAMUSCULAR | Status: DC

## 2017-01-22 MED ORDER — BETAMETHASONE SOD PHOS & ACET 6 (3-3) MG/ML IJ SUSP: 12.0000 mg | Freq: Once | INTRAMUSCULAR | Status: DC

## 2017-01-22 NOTE — Progress Notes (Deleted)
Right side low back pain. Pain down leg to top of foot.Numbness in bottom of foot. Pain is worse first thing in the morning. Says its very difficult to get moving in the morning. Very stiff and feels like she can't walk straight. Takes a Tramadol and lays back down for 30 minutes and then does better when she gets up again. Currently in PT.

## 2017-01-22 NOTE — Patient Instructions (Signed)

## 2017-01-22 NOTE — Progress Notes (Unsigned)
Fluoro Time: 26 sec Mgy: 19.70

## 2017-01-26 ENCOUNTER — Encounter: Payer: Self-pay | Admitting: Physical Therapy

## 2017-01-26 ENCOUNTER — Ambulatory Visit: Payer: PPO | Admitting: Physical Therapy

## 2017-01-26 DIAGNOSIS — G8929 Other chronic pain: Secondary | ICD-10-CM

## 2017-01-26 DIAGNOSIS — M6281 Muscle weakness (generalized): Secondary | ICD-10-CM

## 2017-01-26 DIAGNOSIS — M25562 Pain in left knee: Secondary | ICD-10-CM | POA: Diagnosis not present

## 2017-01-26 DIAGNOSIS — M25561 Pain in right knee: Principal | ICD-10-CM

## 2017-01-26 NOTE — Therapy (Signed)
[place] MAIN [facility] SERVICES [address] [place], [place], [address] Phone: [phone]   Fax:  [phone]  Physical Therapy Treatment  Patient Details  Name: Samantha Garcia MRN: [id number] Date of Birth: 05-14-1932 Referring Provider: Basil Dess  Encounter Date: 01/26/2017      PT End of Session - 01/26/17 1355    Visit Number 7   Number of Visits 16   Date for PT Re-Evaluation 03/03/17   Authorization Type gcode 7   Authorization Time Period 10   PT Start Time 1300   PT Stop Time 1345   PT Time Calculation (min) 45 min   Equipment Utilized During Treatment Gait belt   Activity Tolerance Patient tolerated treatment well;No increased pain   Behavior During Therapy WFL for tasks assessed/performed      Past Medical History:  Diagnosis Date  . Arthritis    B Knee  . Cancer ([place])    Left Breast; 5 years cancer free(7/17)  . Complication of anesthesia    Pt stated it took her a long time to wake up after having Hysterectomy in the 70s, but has not had any issues since  . Dysrhythmia    Atrial Fibrillation  . Hypertension    controlled by medication  . PPD positive 1968    Past Surgical History:  Procedure Laterality Date  . ABDOMINAL HYSTERECTOMY  09/20/75  . APPENDECTOMY    . BACK SURGERY  03/12/10  . BREAST LUMPECTOMY Left   . BREAST SURGERY Left 11/14/10   DCIS carcinoma InSitu   . CARDIAC CATHETERIZATION    . CATARACT EXTRACTION Bilateral   . COLONOSCOPY W/ POLYPECTOMY    . COLPORRHAPHY  02/15/01   Anterior repair   . HARDWARE REMOVAL N/A 09/28/2015   Procedure: REMOVAL OF RIGHT PEDICLE SCREWS AND ROD L3, L4, L5 Wiltse approach;  Surgeon: Jessy Oto, MD;  Location: [facility];  Service: Orthopedics;  Laterality: N/A;  . ROTATOR CUFF REPAIR Left 06/15/08    There were no vitals filed for this visit.      Subjective Assessment - 01/26/17 1259    Subjective Pt states that after the shot in her back she had a lot of  pain; Pt reported tightness in back of legs and pt reported no energy afterwards;  Pt thought she would feel much better after the shots   Pertinent History 83 yo Female presents to therapy for bilateral knee osteoarthritis. Pt reports she used to run and had to stop because of knee pain a while ago; Pt has arthritis in knees, back and hands; Pt had back surgery one year ago in April to remove the hardware in low back; the screws had loosened up and was aggravating a nerve; Pt symptoms resolved completely immediately after surgery for 3 months; back pain started coming back July 2017 and the knee arthritis had also further developed; Pt had gotten better in february after therapy for the knees; but the pain and stiffness has increased to where she couldnt get out of bed; takes tramadol 2x day but tries to limit that; Pt has not been able to keep up with HEP from previous therapy and not feeling well; Pt reported a fall about 1 month ago;   Limitations Standing;Walking;Sitting;Lifting;House hold activities   How long can you sit comfortably? couple hours   How long can you stand comfortably? longer with feet apart about 30 min   How long can you walk comfortably? 100 ft  Diagnostic tests xray shows arthritis in bilateral knees;    Patient Stated Goals climb stairs with no pain;   Currently in Pain? Yes   Pain Score 2    Pain Location Knee   Pain Orientation Right;Left   Pain Descriptors / Indicators Throbbing   Pain Type Chronic pain   Pain Onset More than a month ago   Pain Frequency Intermittent   Aggravating Factors  activity   Pain Relieving Factors rest/heat   Effect of Pain on Daily Activities decreased transfer tolerance;    Multiple Pain Sites No   Pain Onset More than a month ago      PT TREATMENT;   Supine; patella mobs inferior and superior Grade IV 2 x 30 sec ea  Hooklying; AP and PA GradeIV mobs 2 x 30 sec ea   Seated on plinth with LE off table;  Distraction 3 x 30 sec ea     LAQ with 1# ankle weight; x 15 with R LE; left unable to do because pain pain is worse when returning to neutral with L LE; Pt L LE is very painful and can not extend with out pain in supine or seated;  Hamstring curl with 1# ankle weight x 15 ea; pt reported no pain;  Seated Hip Flexion alternating LE with 1# ankle weight 2 x 20; pt reported no pain;   Standing heel raises; 2 x 15 with 1# ankle weight; pt deomnstrated good heel lift  Standing hip abduction with 1# weight; to improve hip abduction strength; Pt reports feeling it in stance leg and the swing leg; x 15 ea  Toe taps on 5" stair; alt LE 2 x 10 ea  Pt was verbally cued during exercises on what muscles should be working and the technique of exercises; Pt also verbally cued to perform exercises slowly; PT advised pt to count through the exercise to slow movement; Pt was advised at home to do the exercises in a pain free ROM;   Recumbent bike x 3 min; was able to complete the full range of motion on bike with no pain (unbilled)  Pt tolerated treatment well with no increase in pain                             PT Education - 01/26/17 1305    Education provided Yes   Education Details strengthening, manual therapy; aquatic therapy   Person(s) Educated Patient   Methods Explanation;Verbal cues;Demonstration   Comprehension Verbalized understanding;Returned demonstration;Verbal cues required             PT Long Term Goals - 01/08/17 1437      PT LONG TERM GOAL #1   Title Pt will be independent with HEP to improve carryover and increase strength, mobility and gait negotiation;   Time 8   Period Weeks   Status New     PT LONG TERM GOAL #2   Title Pt will decrease the 5 x sit to stand by 30 seconds in order to decrease risk of fall and improve function;   Baseline 68 sec   Time 8   Period Weeks   Status New     PT LONG TERM GOAL #3   Title Pt will negotiate 4, 6" stairs with R rail assist  independently with no increase in pain in order to improve ability to get in/out of home;   Time 8   Period Weeks   Status  New     PT LONG TERM GOAL #4   Title Pt will increase LE strength bilaterally to 4+/5 in order to improve sit to stand and improve tolerance to functional activities;   Time 8   Period Weeks   Status New     PT LONG TERM GOAL #5   Title Pt will increase LEFS to 70/80 in order to improve function and daily activities;    Baseline 56   Time 8   Period Weeks   Status New               Plan - 01/26/17 1356    Clinical Impression Statement Pt had the steroid injection in her back last week; Pt reports still feeling fatigue and discomfort with the injection; Pt was in more pain and discomfort today, after manual therapy pt was still having trouble with AROM; Pt left knee ROM worse than right; Pt was performing the exercises with ankle weights last week and today had to go back to no weight or 1#; Pt was able to tolerate more activities in standing with no back pain and with ankle weights; Pt was verbally cued for some exercises to perform slow controlled movements in order for better muscle activation; Pt will continue to benefit from skilled PT in order to increase strength, movement, and gait safety;    Rehab Potential Fair   Clinical Impairments Affecting Rehab Potential negative-chronic, lives alone, back pain, age, fall risk, BLE, not keeping up with HEP from spring; positive- motivation to decrease pain; independent   PT Frequency 2x / week   PT Duration 8 weeks   PT Treatment/Interventions ADLs/Self Care Home Management;Gait training;Stair training;Neuromuscular re-education;Energy conservation;Functional mobility training;Therapeutic activities;Therapeutic exercise;Moist Heat;Manual techniques;Passive range of motion;Balance training;Cryotherapy;Aquatic Therapy;Electrical Stimulation;Ultrasound;Patient/family education;Taping;Dry needling   PT Next Visit Plan  patella mobs; strengthening; consider filling out LEFs   PT Home Exercise Plan see pt instructions; adjusted HEP given from orthopedic doctor   Consulted and Agree with Plan of Care Patient      Patient will benefit from skilled therapeutic intervention in order to improve the following deficits and impairments:  Pain, Decreased mobility, Decreased activity tolerance, Decreased endurance, Decreased range of motion, Decreased strength, Difficulty walking, Decreased balance, Hypomobility, Impaired sensation, Increased edema  Visit Diagnosis: Chronic pain of right knee  Chronic pain of left knee  Muscle weakness (generalized)     Problem List Patient Active Problem List   Diagnosis Date Noted  . Right lumbar radiculopathy 09/28/2015    Class: Chronic  . Painful orthopaedic hardware ([place]) 09/28/2015    Class: Chronic   Deyana Wnuk SPT This entire session was performed under direct supervision and direction of a licensed Chiropractor . I have personally read, edited and approve of the note as written.  Trotter,Margaret PT, DPT 01/26/2017, 3:44 PM  [place] MAIN [facility] SERVICES [address] [place], [place], [address] Phone: [phone]   Fax:  [phone]  Name: ANISA LEANOS MRN: [id number] Date of Birth: July 14, 1931

## 2017-01-26 NOTE — Procedures (Signed)
Samantha Garcia an 83 year old female followed by Dr. Louanne Skye who request a right L5 transforaminal epidural steroid injection for her right hip and leg pain. She is having right hip and leg pain to the top of the foot and a fairly classic L5 distribution. She gets some numbness in the bottom of foot which is more of an S1 distribution. It's worse first thing in the morning as she gets difficult with movement in the morning. She does take tramadol and has to wait 30 minutes before she really do much. She is currently undergoing physical therapy.  Lumbosacral Transforaminal Epidural Steroid Injection - Infraneural Approach with Fluoroscopic Guidance  Patient: Samantha Garcia      Date of Birth: September 14, 1931 MRN: [id number] PCP: System, Pcp Not In      Visit Date: 01/22/2017   Universal Protocol:     Consent Given By: the patient  Position: PRONE   Additional Comments: Vital signs were monitored before and after the procedure. Patient was prepped and draped in the usual sterile fashion. The correct patient, procedure, and site was verified.   Injection Procedure Details:  Procedure Site One Meds Administered:  Meds ordered this encounter  Medications  . lidocaine (PF) (XYLOCAINE) 1 % injection 2 mL  . betamethasone acetate-betamethasone sodium phosphate (CELESTONE) injection 12 mg      Laterality: Right  Location/Site:  L5-S1  Needle size: 22 G  Needle type: Spinal  Needle Placement: Transforaminal  Findings:  -Contrast Used: 1 mL iohexol 180 mg iodine/mL   -Comments: Excellent flow of contrast along the nerve and into the epidural space.  Procedure Details: After squaring off the end-plates of the desired vertebral level to get a true AP view, the C-arm was obliqued to the painful side so that the superior articulating process is positioned about 1/3 the length of the inferior endplate.  The needle was aimed toward the junction of the superior articular process and the transverse  process of the inferior vertebrae. The needle's initial entry is in the lower third of the foramen through Kambin's triangle. The soft tissues overlying this target were infiltrated with 2-3 ml. of 1% Lidocaine without Epinephrine.  The spinal needle was then inserted and advanced toward the target using a "trajectory" view along the fluoroscope beam.  Under AP and lateral visualization, the needle was advanced so it did not puncture dura and did not traverse medially beyond the 6 o'clock position of the pedicle. Bi-planar projections were used to confirm position. Aspiration was confirmed to be negative for CSF and/or blood. A 1-2 ml. volume of Isovue-250 was injected and flow of contrast was noted at each level. Radiographs were obtained for documentation purposes.   After attaining the desired flow of contrast documented above, a 0.5 to 1.0 ml test dose of 0.25% Marcaine was injected into each respective transforaminal space.  The patient was observed for 90 seconds post injection.  After no sensory deficits were reported, and normal lower extremity motor function was noted,   the above injectate was administered so that equal amounts of the injectate were placed at each foramen (level) into the transforaminal epidural space.   Additional Comments:  The patient tolerated the procedure well Dressing: Band-Aid    Post-procedure details: Patient was observed during the procedure. Post-procedure instructions were reviewed.  Patient left the clinic in stable condition.

## 2017-01-28 ENCOUNTER — Ambulatory Visit: Payer: PPO | Admitting: Physical Therapy

## 2017-01-28 ENCOUNTER — Encounter: Payer: Self-pay | Admitting: Physical Therapy

## 2017-01-28 DIAGNOSIS — M25562 Pain in left knee: Secondary | ICD-10-CM

## 2017-01-28 DIAGNOSIS — M25561 Pain in right knee: Principal | ICD-10-CM

## 2017-01-28 DIAGNOSIS — M6281 Muscle weakness (generalized): Secondary | ICD-10-CM

## 2017-01-28 DIAGNOSIS — G8929 Other chronic pain: Secondary | ICD-10-CM

## 2017-01-28 NOTE — Therapy (Signed)
[place] MAIN [facility] SERVICES [address] [place], [place], [address] Phone: [phone]   Fax:  [phone]  Physical Therapy Treatment  Patient Details  Name: Samantha Garcia MRN: [id number] Date of Birth: 06-23-1932 Referring Provider: Basil Dess  Encounter Date: 01/28/2017      PT End of Session - 01/28/17 1319    Visit Number 8   Number of Visits 16   Date for PT Re-Evaluation 03/03/17   Authorization Type gcode 8   Authorization Time Period 10   PT Start Time 1300   PT Stop Time 1345   PT Time Calculation (min) 45 min   Equipment Utilized During Treatment Gait belt   Activity Tolerance Patient tolerated treatment well;No increased pain   Behavior During Therapy WFL for tasks assessed/performed      Past Medical History:  Diagnosis Date  . Arthritis    B Knee  . Cancer ([place])    Left Breast; 5 years cancer free(7/17)  . Complication of anesthesia    Pt stated it took her a long time to wake up after having Hysterectomy in the 70s, but has not had any issues since  . Dysrhythmia    Atrial Fibrillation  . Hypertension    controlled by medication  . PPD positive 1968    Past Surgical History:  Procedure Laterality Date  . ABDOMINAL HYSTERECTOMY  09/20/75  . APPENDECTOMY    . BACK SURGERY  03/12/10  . BREAST LUMPECTOMY Left   . BREAST SURGERY Left 11/14/10   DCIS carcinoma InSitu   . CARDIAC CATHETERIZATION    . CATARACT EXTRACTION Bilateral   . COLONOSCOPY W/ POLYPECTOMY    . COLPORRHAPHY  02/15/01   Anterior repair   . HARDWARE REMOVAL N/A 09/28/2015   Procedure: REMOVAL OF RIGHT PEDICLE SCREWS AND ROD L3, L4, L5 Wiltse approach;  Surgeon: Jessy Oto, MD;  Location: [facility];  Service: Orthopedics;  Laterality: N/A;  . ROTATOR CUFF REPAIR Left 06/15/08    There were no vitals filed for this visit.      Subjective Assessment - 01/28/17 1257    Subjective Pt reports that the weather and maybe not the shot is causing  her back pain; Pt reports the L leg is worse than the right; But pt states the R leg going up stairs hurts worse;    Pertinent History 83 yo Female presents to therapy for bilateral knee osteoarthritis. Pt reports she used to run and had to stop because of knee pain a while ago; Pt has arthritis in knees, back and hands; Pt had back surgery one year ago in April to remove the hardware in low back; the screws had loosened up and was aggravating a nerve; Pt symptoms resolved completely immediately after surgery for 3 months; back pain started coming back July 2017 and the knee arthritis had also further developed; Pt had gotten better in february after therapy for the knees; but the pain and stiffness has increased to where she couldnt get out of bed; takes tramadol 2x day but tries to limit that; Pt has not been able to keep up with HEP from previous therapy and not feeling well; Pt reported a fall about 1 month ago;   Limitations Standing;Walking;Sitting;Lifting;House hold activities   How long can you sit comfortably? couple hours   How long can you stand comfortably? longer with feet apart about 30 min   How long can you walk comfortably? 100 ft   Diagnostic  tests xray shows arthritis in bilateral knees;    Patient Stated Goals climb stairs with no pain;   Pain Score 2    Pain Location Knee  pulling discomfort   Pain Orientation Right;Left   Pain Descriptors / Indicators Discomfort  pulling discomfort   Pain Type Chronic pain   Pain Radiating Towards just knee   Pain Onset More than a month ago   Aggravating Factors  activity   Pain Relieving Factors rest   Effect of Pain on Daily Activities decreased activity and transfer   Multiple Pain Sites Yes   Pain Score 2   Pain Location Back   Pain Descriptors / Indicators Dull;Aching   Pain Type Chronic pain   Pain Onset More than a month ago   Pain Frequency Intermittent      PT TREATMENT;    Recumbent bike x 5 min; to increase knee  painfree ROM with activity as well as using opposite leg to push through ROM (unbilled)   // bars;   Standing; with 2# weight  Hip flexion R/L x12 ea  Hip abduction R/L x12 ea  Hip extension R/L x12 ea  Pt cued for slow movement through exercise in order to increase muscle activation and work on LE control; Pt required min verbal cues for foot positioning during exercises and to keep knees extended;  Pt reports fatigue since the shot; Pt today was limited in exercise by fatigue and required few rest breaks;   HR taken after 2 exercises- 62 bpm; after third exercise HR -66 bpm  Step downs Lateral/Forward; x 5 ea leg ea way; R LE is weaker than L and pt is unable to control R LE all the way down; Pt required mod cues for exercise technique and position; Pt required mod UE support from rails for exercise;   Seated;  LAQ x 10 ea; Pt has less pain than before but has no fluidity of movement; With increased reps pt was able to perform better X 5 with 2# weight;   Pt tolerated treatment well; with no increase in pain; Pt educated on arthritis support group and to keep moving because pt tends to do better with exercises as she continues to move;                             PT Education - 01/28/17 1305    Education provided Yes   Education Details strengthening; progressed HEP; arthritis support group;    Person(s) Educated Patient   Methods Explanation;Verbal cues;Demonstration   Comprehension Verbalized understanding;Returned demonstration;Verbal cues required             PT Long Term Goals - 01/08/17 1437      PT LONG TERM GOAL #1   Title Pt will be independent with HEP to improve carryover and increase strength, mobility and gait negotiation;   Time 8   Period Weeks   Status New     PT LONG TERM GOAL #2   Title Pt will decrease the 5 x sit to stand by 30 seconds in order to decrease risk of fall and improve function;   Baseline 68 sec   Time 8    Period Weeks   Status New     PT LONG TERM GOAL #3   Title Pt will negotiate 4, 6" stairs with R rail assist independently with no increase in pain in order to improve ability to get in/out of home;  Time 8   Period Weeks   Status New     PT LONG TERM GOAL #4   Title Pt will increase LE strength bilaterally to 4+/5 in order to improve sit to stand and improve tolerance to functional activities;   Time 8   Period Weeks   Status New     PT LONG TERM GOAL #5   Title Pt will increase LEFS to 70/80 in order to improve function and daily activities;    Baseline 56   Time 8   Period Weeks   Status New               Plan - 01/28/17 1401    Clinical Impression Statement Pt was instructed in LE strengthening exercises; Pt began on the bike for increased pain-free ROM; Pt required breaks between exercises because of fatigue but was able to complete all the reps; Pt throughout treatment will have difficulty and discomfort with the first few reps and will improve with more practice; Pt negotiating stairs is normally a challenged; Today pt exhibits a lot of weakness when doing step downs on 2" step but no pain; Pt had hard time controlling right LE when doing step down and required a lot of UE assist; Pt will continue to benefit from skilled PT in order to improve strengthening and gait safety;    Rehab Potential Fair   Clinical Impairments Affecting Rehab Potential negative-chronic, lives alone, back pain, age, fall risk, BLE, not keeping up with HEP from spring; positive- motivation to decrease pain; independent   PT Frequency 2x / week   PT Duration 8 weeks   PT Treatment/Interventions ADLs/Self Care Home Management;Gait training;Stair training;Neuromuscular re-education;Energy conservation;Functional mobility training;Therapeutic activities;Therapeutic exercise;Moist Heat;Manual techniques;Passive range of motion;Balance training;Cryotherapy;Aquatic Therapy;Electrical  Stimulation;Ultrasound;Patient/family education;Taping;Dry needling   PT Next Visit Plan patella mobs; strengthening; consider filling out LEFs   PT Home Exercise Plan see pt instructions; adjusted HEP given from orthopedic doctor   Consulted and Agree with Plan of Care Patient      Patient will benefit from skilled therapeutic intervention in order to improve the following deficits and impairments:  Pain, Decreased mobility, Decreased activity tolerance, Decreased endurance, Decreased range of motion, Decreased strength, Difficulty walking, Decreased balance, Hypomobility, Impaired sensation, Increased edema  Visit Diagnosis: Chronic pain of right knee  Chronic pain of left knee  Muscle weakness (generalized)     Problem List Patient Active Problem List   Diagnosis Date Noted  . Right lumbar radiculopathy 09/28/2015    Class: Chronic  . Painful orthopaedic hardware ([place]) 09/28/2015    Class: Chronic   Koda Defrank SPT This entire session was performed under direct supervision and direction of a licensed Chiropractor . I have personally read, edited and approve of the note as written.  Trotter,Margaret PT, DPT 01/28/2017, 5:34 PM  [place] MAIN [facility] SERVICES [address] [place], [place], [address] Phone: [phone]   Fax:  [phone]  Name: MINDEL FRISCIA MRN: [id number] Date of Birth: 1932/05/10

## 2017-02-02 ENCOUNTER — Ambulatory Visit: Payer: PPO | Admitting: Physical Therapy

## 2017-02-02 ENCOUNTER — Encounter: Payer: Self-pay | Admitting: Physical Therapy

## 2017-02-02 DIAGNOSIS — M6281 Muscle weakness (generalized): Secondary | ICD-10-CM

## 2017-02-02 DIAGNOSIS — M25562 Pain in left knee: Secondary | ICD-10-CM

## 2017-02-02 DIAGNOSIS — M25561 Pain in right knee: Principal | ICD-10-CM

## 2017-02-02 DIAGNOSIS — G8929 Other chronic pain: Secondary | ICD-10-CM

## 2017-02-02 NOTE — Therapy (Signed)
[place] MAIN [facility] SERVICES [address] [place], [place], [address] Phone: [phone]   Fax:  [phone]  Physical Therapy Treatment  Patient Details  Name: Samantha Garcia MRN: [id number] Date of Birth: May 21, 1932 Referring Provider: Basil Dess  Encounter Date: 02/02/2017      PT End of Session - 02/02/17 1317    Visit Number 9   Number of Visits 16   Date for PT Re-Evaluation 03/03/17   Authorization Type gcode 9   Authorization Time Period 10   PT Start Time 1315   PT Stop Time 1345   PT Time Calculation (min) 30 min   Equipment Utilized During Treatment Gait belt   Activity Tolerance Patient tolerated treatment well;No increased pain   Behavior During Therapy WFL for tasks assessed/performed      Past Medical History:  Diagnosis Date  . Arthritis    B Knee  . Cancer ([place])    Left Breast; 5 years cancer free(7/17)  . Complication of anesthesia    Pt stated it took her a long time to wake up after having Hysterectomy in the 70s, but has not had any issues since  . Dysrhythmia    Atrial Fibrillation  . Hypertension    controlled by medication  . PPD positive 1968    Past Surgical History:  Procedure Laterality Date  . ABDOMINAL HYSTERECTOMY  09/20/75  . APPENDECTOMY    . BACK SURGERY  03/12/10  . BREAST LUMPECTOMY Left   . BREAST SURGERY Left 11/14/10   DCIS carcinoma InSitu   . CARDIAC CATHETERIZATION    . CATARACT EXTRACTION Bilateral   . COLONOSCOPY W/ POLYPECTOMY    . COLPORRHAPHY  02/15/01   Anterior repair   . HARDWARE REMOVAL N/A 09/28/2015   Procedure: REMOVAL OF RIGHT PEDICLE SCREWS AND ROD L3, L4, L5 [place] approach;  Surgeon: Jessy Oto, MD;  Location: [facility];  Service: Orthopedics;  Laterality: N/A;  . ROTATOR CUFF REPAIR Left 06/15/08    There were no vitals filed for this visit.      Subjective Assessment - 02/02/17 1316    Subjective Pt reports feeling overall better especially with the amount  of activity this weekend; Even with weather LE's have not been affected as much; Pt reports feeling better when socializing and around others; Pt reports the back injection doesnt seem to be effective like it was in the past;    Pertinent History 83 yo Female presents to therapy for bilateral knee osteoarthritis. Pt reports she used to run and had to stop because of knee pain a while ago; Pt has arthritis in knees, back and hands; Pt had back surgery one year ago in April to remove the hardware in low back; the screws had loosened up and was aggravating a nerve; Pt symptoms resolved completely immediately after surgery for 3 months; back pain started coming back July 2017 and the knee arthritis had also further developed; Pt had gotten better in february after therapy for the knees; but the pain and stiffness has increased to where she couldnt get out of bed; takes tramadol 2x day but tries to limit that; Pt has not been able to keep up with HEP from previous therapy and not feeling well; Pt reported a fall about 1 month ago;   Limitations Standing;Walking;Sitting;Lifting;House hold activities   How long can you sit comfortably? couple hours   How long can you stand comfortably? longer with feet apart about 30 min  How long can you walk comfortably? 100 ft   Diagnostic tests xray shows arthritis in bilateral knees;    Patient Stated Goals climb stairs with no pain;   Currently in Pain? No/denies   Pain Onset More than a month ago   Multiple Pain Sites No   Pain Onset More than a month ago      PT TREATMENT  Recumbent bike x 3 min; to increase knee pain free ROM (unbilled)   // bars;  Step downs Lateral/Forward off 2" step; 2 x 10 ea; Pt demonstrates weakness in both LE and has trouble controlling descent down the step; Pt requires mod cuing to perform exercise to tap LE not on floor and avoid unweighting on opposite LE; Pt has more difficulty with R LE and will use UE more;   Single leg calf  raises; x15 ea; Pt required verbal cues to keep knee straight to strengthening the calf and not use other muscles   Side Stepping with red tband 4 x 10 feet; Pt required min cuing for technique for optimal hip abductor strengthening;     Wall slides; 2 x 15; Pt was able to perform with no pain, Pt reports more difficulty on R LE because weakness; added to HEP   Pt tolerated treatment well with no increase in pain;                             PT Education - 02/02/17 1317    Education provided Yes   Education Details strengthening LE; HEP   Person(s) Educated Patient   Methods Verbal cues;Explanation;Demonstration   Comprehension Verbalized understanding;Returned demonstration;Verbal cues required             PT Long Term Goals - 01/08/17 1437      PT LONG TERM GOAL #1   Title Pt will be independent with HEP to improve carryover and increase strength, mobility and gait negotiation;   Time 8   Period Weeks   Status New     PT LONG TERM GOAL #2   Title Pt will decrease the 5 x sit to stand by 30 seconds in order to decrease risk of fall and improve function;   Baseline 68 sec   Time 8   Period Weeks   Status New     PT LONG TERM GOAL #3   Title Pt will negotiate 4, 6" stairs with R rail assist independently with no increase in pain in order to improve ability to get in/out of home;   Time 8   Period Weeks   Status New     PT LONG TERM GOAL #4   Title Pt will increase LE strength bilaterally to 4+/5 in order to improve sit to stand and improve tolerance to functional activities;   Time 8   Period Weeks   Status New     PT LONG TERM GOAL #5   Title Pt will increase LEFS to 70/80 in order to improve function and daily activities;    Baseline 56   Time 8   Period Weeks   Status New               Plan - 02/02/17 1318    Clinical Impression Statement Patient late to session; Pt was instructed in LE strengthening exercises; Pt reported  no increase in knee pain today; Limited today because patient was late; Pt performed step downs forward and lateral pt still uses  a lot of UE support because LE's are weak and cannot contract enough to push up;; Pt also did resisted side stepping with no difficulty; Pt performed wall slides today with no pain, when previously pain was limiting her; Pt reported feeling the wall slides in he RLE more which has been shown to be weaker; Pt would continue to benefit from skilled PT in order to improve strength, gait safety, and transfers;    Rehab Potential Fair   Clinical Impairments Affecting Rehab Potential negative-chronic, lives alone, back pain, age, fall risk, BLE, not keeping up with HEP from spring; positive- motivation to decrease pain; independent   PT Frequency 2x / week   PT Duration 8 weeks   PT Treatment/Interventions ADLs/Self Care Home Management;Gait training;Stair training;Neuromuscular re-education;Energy conservation;Functional mobility training;Therapeutic activities;Therapeutic exercise;Moist Heat;Manual techniques;Passive range of motion;Balance training;Cryotherapy;Aquatic Therapy;Electrical Stimulation;Ultrasound;Patient/family education;Taping;Dry needling   PT Next Visit Plan patella mobs; strengthening; consider filling out LEFs   PT Home Exercise Plan see pt instructions; adjusted HEP given from orthopedic doctor   Consulted and Agree with Plan of Care Patient      Patient will benefit from skilled therapeutic intervention in order to improve the following deficits and impairments:  Pain, Decreased mobility, Decreased activity tolerance, Decreased endurance, Decreased range of motion, Decreased strength, Difficulty walking, Decreased balance, Hypomobility, Impaired sensation, Increased edema  Visit Diagnosis: Chronic pain of right knee  Chronic pain of left knee  Muscle weakness (generalized)     Problem List Patient Active Problem List   Diagnosis Date Noted  .  Right lumbar radiculopathy 09/28/2015    Class: Chronic  . Painful orthopaedic hardware ([place]) 09/28/2015    Class: Chronic   Doreene Nest, SPT This entire session was performed under direct supervision and direction of a licensed therapist/therapist assistant . I have personally read, edited and approve of the note as written.  Trotter,Margaret PT, DPT 02/02/2017, 3:41 PM  [place] MAIN [facility] SERVICES [address] [place], [place], [address] Phone: [phone]   Fax:  [phone]  Name: Samantha Garcia MRN: [id number] Date of Birth: 29-Jun-1931

## 2017-02-03 ENCOUNTER — Other Ambulatory Visit (INDEPENDENT_AMBULATORY_CARE_PROVIDER_SITE_OTHER): Payer: Self-pay | Admitting: Specialist

## 2017-02-04 ENCOUNTER — Encounter: Payer: Self-pay | Admitting: Physical Therapy

## 2017-02-04 ENCOUNTER — Ambulatory Visit: Payer: PPO | Admitting: Physical Therapy

## 2017-02-04 DIAGNOSIS — M25562 Pain in left knee: Secondary | ICD-10-CM | POA: Diagnosis not present

## 2017-02-04 DIAGNOSIS — M25561 Pain in right knee: Principal | ICD-10-CM

## 2017-02-04 DIAGNOSIS — G8929 Other chronic pain: Secondary | ICD-10-CM

## 2017-02-04 DIAGNOSIS — M6281 Muscle weakness (generalized): Secondary | ICD-10-CM

## 2017-02-04 NOTE — Therapy (Signed)
[place] MAIN [facility] SERVICES [address] [place], [place], [address] Phone: [phone]   Fax:  [phone]  Physical Therapy Treatment/ Progress Note  Patient Details  Name: Samantha Garcia MRN: [id number] Date of Birth: Oct 22, 1931 Referring Provider: Basil Dess  Encounter Date: 02/04/2017      PT End of Session - 02/04/17 1256    Visit Number 10   Number of Visits 16   Date for PT Re-Evaluation 03/03/17   Authorization Type gcode 10   Authorization Time Period 10   PT Start Time 1300   PT Stop Time 1345   PT Time Calculation (min) 45 min   Equipment Utilized During Treatment Gait belt   Activity Tolerance Patient tolerated treatment well;No increased pain   Behavior During Therapy WFL for tasks assessed/performed      Past Medical History:  Diagnosis Date  . Arthritis    B Knee  . Cancer ([place])    Left Breast; 5 years cancer free(7/17)  . Complication of anesthesia    Pt stated it took her a long time to wake up after having Hysterectomy in the 70s, but has not had any issues since  . Dysrhythmia    Atrial Fibrillation  . Hypertension    controlled by medication  . PPD positive 1968    Past Surgical History:  Procedure Laterality Date  . ABDOMINAL HYSTERECTOMY  09/20/75  . APPENDECTOMY    . BACK SURGERY  03/12/10  . BREAST LUMPECTOMY Left   . BREAST SURGERY Left 11/14/10   DCIS carcinoma InSitu   . CARDIAC CATHETERIZATION    . CATARACT EXTRACTION Bilateral   . COLONOSCOPY W/ POLYPECTOMY    . COLPORRHAPHY  02/15/01   Anterior repair   . HARDWARE REMOVAL N/A 09/28/2015   Procedure: REMOVAL OF RIGHT PEDICLE SCREWS AND ROD L3, L4, L5 [place] approach;  Surgeon: Jessy Oto, MD;  Location: [facility];  Service: Orthopedics;  Laterality: N/A;  . ROTATOR CUFF REPAIR Left 06/15/08    There were no vitals filed for this visit.      Subjective Assessment - 02/04/17 1256    Subjective Pt reports feeling better, she states she  feels way more confident now and was able to go up a flight of stairs and down with no pain; Pt reports back pain the day after last treatment;    Pertinent History 83 yo Female presents to therapy for bilateral knee osteoarthritis. Pt reports she used to run and had to stop because of knee pain a while ago; Pt has arthritis in knees, back and hands; Pt had back surgery one year ago in April to remove the hardware in low back; the screws had loosened up and was aggravating a nerve; Pt symptoms resolved completely immediately after surgery for 3 months; back pain started coming back July 2017 and the knee arthritis had also further developed; Pt had gotten better in february after therapy for the knees; but the pain and stiffness has increased to where she couldnt get out of bed; takes tramadol 2x day but tries to limit that; Pt has not been able to keep up with HEP from previous therapy and not feeling well; Pt reported a fall about 1 month ago;   Limitations Standing;Walking;Sitting;Lifting;House hold activities   How long can you sit comfortably? couple hours   How long can you stand comfortably? longer with feet apart about 30 min   How long can you walk comfortably? 100 ft  Diagnostic tests xray shows arthritis in bilateral knees;    Patient Stated Goals climb stairs with no pain;   Currently in Pain? No/denies   Pain Score 0-No pain   Pain Onset More than a month ago   Multiple Pain Sites No   Pain Onset More than a month ago       PT TREATMENT  Recumbent bike x 5 min; to increase knee pain free ROM (unbilled)    Pt instructed in 5x sit to stand with bilat use of arm rest; 34 sec (previously 68) still at high risk for fall  Supine;  SLR R/L 2 x 20 ea (icnreased reps for HEP) Pt reported no pain; verbal cues to tighten quadriceps for better activation;   Sidelying  Straight leg hip abduction R/L 2 x 10ea; increase abductor strength; pt reported more muscle fatigue with exercise;  verbally cue to keep hips from rotating and leg abduct straight up and not at an angle   Seated;  LAQ with 2# ankle weights; 2 x 10; Pt able to do with no pain   Standing; TKE with green tband at knee; x 10 ea LE with 5 sec hold; Pt required verbal and tactile cues for better quad strengthening;   Wall slides; x 15   Pt completed LEFS - 60 ( improved from previous session 56) 21-40% impaired;   Pt tolerated treatment well;       [facility] PT Assessment - 02/04/17 0001      Standardized Balance Assessment   Five times sit to stand comments  34 sec with bilat arm rest (high risk for fall)                             PT Education - 02/04/17 1256    Education provided Yes   Education Details Strengthening, sit to stand, LEFS, HEP   Person(s) Educated Patient   Methods Explanation;Demonstration;Verbal cues   Comprehension Verbalized understanding;Returned demonstration;Verbal cues required             PT Long Term Goals - 02/04/17 1257      PT LONG TERM GOAL #1   Title Pt will be independent with HEP to improve carryover and increase strength, mobility and gait negotiation;   Time 8   Period Weeks   Status On-going     PT LONG TERM GOAL #2   Title Pt will decrease the 5 x sit to stand by 30 seconds in order to decrease risk of fall and improve function;   Baseline 68 sec; (8/15: 34 sec)   Time 8   Period Weeks   Status Achieved     PT LONG TERM GOAL #3   Title Pt will negotiate 4, 6" stairs with R rail assist independently with no increase in pain in order to improve ability to get in/out of home;   Time 8   Period Weeks   Status Achieved     PT LONG TERM GOAL #4   Title Pt will increase LE strength bilaterally to 4+/5 in order to improve sit to stand and improve tolerance to functional activities;   Time 8   Period Weeks   Status On-going     PT LONG TERM GOAL #5   Title Pt will increase LEFS to 70/80 in order to improve function and daily  activities;    Baseline 56; (8/15: 60)   Time 8   Period Weeks   Status  On-going               Plan - 2017/02/24 1257    Clinical Impression Statement Pt was instructed in LE strengthening and assessment of goals; Pt was able to perform straight leg raises today with no pain; Pt is also increasing reps with strengthening exercises; Pt was able to perform LAQ with no pain and added weight when previously have to warm up knee with mobs and no weights; PT did not asses stairs but pt reports ambulating up and down a flight with no pain and rail assist; Pt also decreased her sit to stand time by 30 sec; Pt improved LEFS by 4 points;  Pt will benefit from continued skilled PT in order to improve strength, transfers, and gait;    Rehab Potential Fair   Clinical Impairments Affecting Rehab Potential negative-chronic, lives alone, back pain, age, fall risk, BLE, not keeping up with HEP from spring; positive- motivation to decrease pain; independent   PT Frequency 2x / week   PT Duration 8 weeks   PT Treatment/Interventions ADLs/Self Care Home Management;Gait training;Stair training;Neuromuscular re-education;Energy conservation;Functional mobility training;Therapeutic activities;Therapeutic exercise;Moist Heat;Manual techniques;Passive range of motion;Balance training;Cryotherapy;Aquatic Therapy;Electrical Stimulation;Ultrasound;Patient/family education;Taping;Dry needling   PT Next Visit Plan patella mobs; strengthening; consider filling out LEFs   PT Home Exercise Plan see pt instructions; adjusted HEP given from orthopedic doctor   Consulted and Agree with Plan of Care Patient      Patient will benefit from skilled therapeutic intervention in order to improve the following deficits and impairments:  Pain, Decreased mobility, Decreased activity tolerance, Decreased endurance, Decreased range of motion, Decreased strength, Difficulty walking, Decreased balance, Hypomobility, Impaired sensation,  Increased edema  Visit Diagnosis: Chronic pain of right knee  Chronic pain of left knee  Muscle weakness (generalized)       G-Codes - 2017/02/24 1731    Functional Assessment Tool Used (Outpatient Only) clinical judgement, 5 times sit<>stand, gait ability, LEFs   Functional Limitation Mobility: Walking and moving around   Mobility: Walking and Moving Around Current Status ([id number]) At least 20 percent but less than 40 percent impaired, limited or restricted   Mobility: Walking and Moving Around Goal Status [phone]) At least 1 percent but less than 20 percent impaired, limited or restricted      Problem List Patient Active Problem List   Diagnosis Date Noted  . Right lumbar radiculopathy 09/28/2015    Class: Chronic  . Painful orthopaedic hardware ([place]) 09/28/2015    Class: Chronic   Doreene Nest, SPT This entire session was performed under direct supervision and direction of a licensed therapist/therapist assistant . I have personally read, edited and approve of the note as written.  Trotter,Margaret PT, DPT 24-Feb-2017, 5:31 PM  [place] MAIN [facility] SERVICES [address] [place], [place], [address] Phone: [phone]   Fax:  [phone]  Name: Samantha Garcia MRN: [id number] Date of Birth: [birth year]

## 2017-02-04 NOTE — Telephone Encounter (Signed)
Tramadol refill request 

## 2017-02-05 ENCOUNTER — Other Ambulatory Visit (INDEPENDENT_AMBULATORY_CARE_PROVIDER_SITE_OTHER): Payer: Self-pay | Admitting: Specialist

## 2017-02-05 NOTE — Telephone Encounter (Signed)
Okay to refill tramadol 50 mg 1 tab by mouth every 12 hours when necessary pain #60 tablets no refills

## 2017-02-06 ENCOUNTER — Telehealth (INDEPENDENT_AMBULATORY_CARE_PROVIDER_SITE_OTHER): Payer: Self-pay | Admitting: Specialist

## 2017-02-06 NOTE — Telephone Encounter (Signed)
Patient called asking for a refill on tramadol 50mg  as soon as possible. CB # [phone]

## 2017-02-09 ENCOUNTER — Ambulatory Visit: Payer: PPO | Admitting: Physical Therapy

## 2017-02-09 NOTE — Telephone Encounter (Signed)
I called and advised her that her rx was called in to her pharm on 02/05/17

## 2017-02-11 ENCOUNTER — Encounter: Payer: Self-pay | Admitting: Physical Therapy

## 2017-02-11 ENCOUNTER — Ambulatory Visit: Payer: PPO | Admitting: Physical Therapy

## 2017-02-11 DIAGNOSIS — M25561 Pain in right knee: Principal | ICD-10-CM

## 2017-02-11 DIAGNOSIS — M25562 Pain in left knee: Secondary | ICD-10-CM

## 2017-02-11 DIAGNOSIS — M6281 Muscle weakness (generalized): Secondary | ICD-10-CM

## 2017-02-11 DIAGNOSIS — G8929 Other chronic pain: Secondary | ICD-10-CM

## 2017-02-11 NOTE — Therapy (Signed)
[place] MAIN [facility] SERVICES [address] [place], [place], [address] Phone: [phone]   Fax:  [phone]  Physical Therapy Treatment  Patient Details  Name: Samantha Garcia MRN: [id number] Date of Birth: [birth year] Referring Provider: Basil Dess  Encounter Date: 02/11/2017      PT End of Session - 02/11/17 1255    Visit Number 11   Number of Visits 16   Date for PT Re-Evaluation 03/03/17   Authorization Type gcode 1   Authorization Time Period 10   PT Start Time 1300   PT Stop Time 1345   PT Time Calculation (min) 45 min   Equipment Utilized During Treatment Gait belt   Activity Tolerance Patient tolerated treatment well;No increased pain   Behavior During Therapy WFL for tasks assessed/performed      Past Medical History:  Diagnosis Date  . Arthritis    B Knee  . Cancer ([place])    Left Breast; 5 years cancer free(7/17)  . Complication of anesthesia    Pt stated it took her a long time to wake up after having Hysterectomy in the 70s, but has not had any issues since  . Dysrhythmia    Atrial Fibrillation  . Hypertension    controlled by medication  . PPD positive 1968    Past Surgical History:  Procedure Laterality Date  . ABDOMINAL HYSTERECTOMY  09/20/75  . APPENDECTOMY    . BACK SURGERY  03/12/10  . BREAST LUMPECTOMY Left   . BREAST SURGERY Left 11/14/10   DCIS carcinoma InSitu   . CARDIAC CATHETERIZATION    . CATARACT EXTRACTION Bilateral   . COLONOSCOPY W/ POLYPECTOMY    . COLPORRHAPHY  02/15/01   Anterior repair   . HARDWARE REMOVAL N/A 09/28/2015   Procedure: REMOVAL OF RIGHT PEDICLE SCREWS AND ROD L3, L4, L5 Wiltse approach;  Surgeon: Jessy Oto, MD;  Location: [place];  Service: Orthopedics;  Laterality: N/A;  . ROTATOR CUFF REPAIR Left 06/15/08    There were no vitals filed for this visit.      Subjective Assessment - 02/11/17 1254    Subjective Pt reports that monday she was in so much knee and back pain  that she didnt get out of bed all day; Pt reports knee was swollen and felt hot "it was all I could do to roll over in bed"; Pt denies any knee pain today and states that negotiating stairs is still improving;    Pertinent History 83 yo Female presents to therapy for bilateral knee osteoarthritis. Pt reports she used to run and had to stop because of knee pain a while ago; Pt has arthritis in knees, back and hands; Pt had back surgery one year ago in April to remove the hardware in low back; the screws had loosened up and was aggravating a nerve; Pt symptoms resolved completely immediately after surgery for 3 months; back pain started coming back July 2017 and the knee arthritis had also further developed; Pt had gotten better in february after therapy for the knees; but the pain and stiffness has increased to where she couldnt get out of bed; takes tramadol 2x day but tries to limit that; Pt has not been able to keep up with HEP from previous therapy and not feeling well; Pt reported a fall about 1 month ago;   Limitations Standing;Walking;Sitting;Lifting;House hold activities   How long can you sit comfortably? couple hours   How long can you stand comfortably? longer  with feet apart about 30 min   How long can you walk comfortably? 100 ft   Diagnostic tests xray shows arthritis in bilateral knees;    Patient Stated Goals climb stairs with no pain;   Currently in Pain? No/denies   Pain Score 0-No pain   Pain Location Knee   Pain Orientation Right;Left   Pain Descriptors / Indicators Aching   Pain Type Chronic pain   Pain Onset More than a month ago   Pain Frequency Intermittent   Multiple Pain Sites No   Pain Onset More than a month ago      PT TREATMENT;    Crosstrainer bike x 5 min; to increase knee pain free ROM (unbilled)   Parallel bars;   Standing gastroc stretch; pt reports good stretch in R LE; 2 x 30 sec ea; cue to lean hips forward to feel stretch more; added to HEP;   Side  Stepping with red tband 4 x 10 x 2 sets; Pt required min cuing for technique for optimal hip abductor strengthening;   TKE with green tband at knee; 2 x 10 ea LE with 5 sec hold; Pt cued to tighten quad for better strengthening; pt required mod cues throughout all reps for positioning and technique;  Step downs Lateral/Forward off 2" step; 2 x 5 ea; Pt had more pain going forward and demonstrates more difficulty with exercise than before; Pt was using UE to rise and not pushing through stance leg;    Leg press: BLE plate 75# N00 reps Pt required cues to improve knee positioning for better strengthening and control; pt required tactile cues to not hyperextend knees;   Wall Squats x 5; Pt unable to perform when she had previously because painful on the rise of squat;                             PT Education - 02/11/17 1254    Education provided Yes   Education Details strengthening ther ex   Person(s) Educated Patient   Methods Explanation;Demonstration;Verbal cues   Comprehension Verbalized understanding;Returned demonstration;Verbal cues required             PT Long Term Goals - 02/04/17 1257      PT LONG TERM GOAL #1   Title Pt will be independent with HEP to improve carryover and increase strength, mobility and gait negotiation;   Time 8   Period Weeks   Status On-going     PT LONG TERM GOAL #2   Title Pt will decrease the 5 x sit to stand by 30 seconds in order to decrease risk of fall and improve function;   Baseline 68 sec; (8/15: 34 sec)   Time 8   Period Weeks   Status Achieved     PT LONG TERM GOAL #3   Title Pt will negotiate 4, 6" stairs with R rail assist independently with no increase in pain in order to improve ability to get in/out of home;   Time 8   Period Weeks   Status Achieved     PT LONG TERM GOAL #4   Title Pt will increase LE strength bilaterally to 4+/5 in order to improve sit to stand and improve tolerance to functional  activities;   Time 8   Period Weeks   Status On-going     PT LONG TERM GOAL #5   Title Pt will increase LEFS to 70/80 in order to  improve function and daily activities;    Baseline 56; (8/15: 60)   Time 8   Period Weeks   Status On-going               Plan - 02/11/17 1255    Clinical Impression Statement Pt was instructed in LE strengthening; Pt is demonstrating slow improvement with most activities; If patient warms up properly on bike or with easy exercise, pt is able to perform other exercises easier and with less restriction; Today patient performed the leg press with little difficulty and was able to demonstrate good muscle control; Wall Squats and forward step downs were difficult to perform with both LE's today whe npreviously they had not, missing one session pt reports she regressed slightly; Pt still requires mod cues for difficult or new exercises for positioning and technique to decrease compnsation and optimize muscle strengthening; Pt will continue to benefit form skilled PT in order to improve strength and balance of LE;    Rehab Potential Fair   Clinical Impairments Affecting Rehab Potential negative-chronic, lives alone, back pain, age, fall risk, BLE, not keeping up with HEP from spring; positive- motivation to decrease pain; independent   PT Frequency 2x / week   PT Duration 8 weeks   PT Treatment/Interventions ADLs/Self Care Home Management;Gait training;Stair training;Neuromuscular re-education;Energy conservation;Functional mobility training;Therapeutic activities;Therapeutic exercise;Moist Heat;Manual techniques;Passive range of motion;Balance training;Cryotherapy;Aquatic Therapy;Electrical Stimulation;Ultrasound;Patient/family education;Taping;Dry needling   PT Next Visit Plan patella mobs; strengthening; consider filling out LEFs   PT Home Exercise Plan see pt instructions; adjusted HEP given from orthopedic doctor   Consulted and Agree with Plan of Care Patient       Patient will benefit from skilled therapeutic intervention in order to improve the following deficits and impairments:  Pain, Decreased mobility, Decreased activity tolerance, Decreased endurance, Decreased range of motion, Decreased strength, Difficulty walking, Decreased balance, Hypomobility, Impaired sensation, Increased edema  Visit Diagnosis: Chronic pain of right knee  Chronic pain of left knee  Muscle weakness (generalized)     Problem List Patient Active Problem List   Diagnosis Date Noted  . Right lumbar radiculopathy 09/28/2015    Class: Chronic  . Painful orthopaedic hardware ([place]) 09/28/2015    Class: Chronic   Edmonia Gonser SPT This entire session was performed under direct supervision and direction of a licensed Chiropractor . I have personally read, edited and approve of the note as written.  Trotter,Margaret  PT, DPT 02/11/2017, 2:30 PM  [place] MAIN [facility] SERVICES [address] [place], [place], [address] Phone: [phone]   Fax:  [phone]  Name: Samantha Garcia MRN: [id number] Date of Birth: 12-15-1931

## 2017-02-16 ENCOUNTER — Ambulatory Visit: Payer: PPO | Admitting: Physical Therapy

## 2017-02-16 ENCOUNTER — Encounter: Payer: Self-pay | Admitting: Physical Therapy

## 2017-02-16 DIAGNOSIS — G8929 Other chronic pain: Secondary | ICD-10-CM

## 2017-02-16 DIAGNOSIS — M25562 Pain in left knee: Secondary | ICD-10-CM

## 2017-02-16 DIAGNOSIS — M25561 Pain in right knee: Principal | ICD-10-CM

## 2017-02-16 DIAGNOSIS — M6281 Muscle weakness (generalized): Secondary | ICD-10-CM

## 2017-02-16 NOTE — Therapy (Signed)
[place] MAIN [facility] SERVICES [address] [place], [place], [address] Phone: [phone]   Fax:  [phone]  Physical Therapy Treatment  Patient Details  Name: Samantha Garcia MRN: [id number] Date of Birth: 1932/03/01 Referring Provider: Basil Dess  Encounter Date: 02/16/2017      PT End of Session - 02/16/17 1503    Visit Number 12   Number of Visits 16   Date for PT Re-Evaluation 03/03/17   Authorization Type gcode 2   Authorization Time Period 10   PT Start Time 1301   PT Stop Time 1343   PT Time Calculation (min) 42 min   Activity Tolerance Patient tolerated treatment well;No increased pain   Behavior During Therapy WFL for tasks assessed/performed      Past Medical History:  Diagnosis Date  . Arthritis    B Knee  . Cancer ([place])    Left Breast; 5 years cancer free(7/17)  . Complication of anesthesia    Pt stated it took her a long time to wake up after having Hysterectomy in the 70s, but has not had any issues since  . Dysrhythmia    Atrial Fibrillation  . Hypertension    controlled by medication  . PPD positive 1968    Past Surgical History:  Procedure Laterality Date  . ABDOMINAL HYSTERECTOMY  09/20/75  . APPENDECTOMY    . BACK SURGERY  03/12/10  . BREAST LUMPECTOMY Left   . BREAST SURGERY Left 11/14/10   DCIS carcinoma InSitu   . CARDIAC CATHETERIZATION    . CATARACT EXTRACTION Bilateral   . COLONOSCOPY W/ POLYPECTOMY    . COLPORRHAPHY  02/15/01   Anterior repair   . HARDWARE REMOVAL N/A 09/28/2015   Procedure: REMOVAL OF RIGHT PEDICLE SCREWS AND ROD L3, L4, L5 Wiltse approach;  Surgeon: Jessy Oto, MD;  Location: [place];  Service: Orthopedics;  Laterality: N/A;  . ROTATOR CUFF REPAIR Left 06/15/08    There were no vitals filed for this visit.      Subjective Assessment - 02/16/17 1303    Subjective Pt reports she is doing alright; she reports that she is having back pain today. She reports that she is  seeing her orthopedist on Thu for back pain.    Pertinent History 83 yo Female presents to therapy for bilateral knee osteoarthritis. Pt reports she used to run and had to stop because of knee pain a while ago; Pt has arthritis in knees, back and hands; Pt had back surgery one year ago in April to remove the hardware in low back; the screws had loosened up and was aggravating a nerve; Pt symptoms resolved completely immediately after surgery for 3 months; back pain started coming back July 2017 and the knee arthritis had also further developed; Pt had gotten better in february after therapy for the knees; but the pain and stiffness has increased to where she couldnt get out of bed; takes tramadol 2x day but tries to limit that; Pt has not been able to keep up with HEP from previous therapy and not feeling well; Pt reported a fall about 1 month ago;   Limitations Standing;Walking;Sitting;Lifting;House hold activities   How long can you sit comfortably? couple hours   How long can you stand comfortably? longer with feet apart about 30 min   How long can you walk comfortably? 100 ft   Diagnostic tests xray shows arthritis in bilateral knees;    Patient Stated Goals climb stairs with  no pain;   Currently in Pain? Other (Comment)  No pain at this moment   Pain Score 7    Pain Location Back   Pain Orientation Left;Right   Pain Descriptors / Indicators Aching   Pain Type Chronic pain   Pain Onset More than a month ago   Pain Frequency Intermittent   Aggravating Factors  activity   Pain Relieving Factors rest   Effect of Pain on Daily Activities decreased activity tolerance   Multiple Pain Sites No   Pain Onset More than a month ago        Treatment:   Crosstrainer bike x 5 min; to decrease knee pain and stiffness with AROM (unbilled)     Ther-ex:   Leg press:  BLE    60# x 12 reps (75# attempted, but decreased for first set due to pt moving too quickly and having muscle spasm.   75# x 10  reps pt reports no pain, only minor fatigue, pt given min cues to avoid knee valgus bilaterally  Single leg  25# 2 x 15 each side; pt required verbal and tactile cues for correct form/positioning to avoid knee valgus, worse on RLE.               Heel raises  75# x 15 reps; pt instructed to relax into DF stretch between repetitions. Pt reports this exercise is easy.     Standing in parallel bars:            Zig-zag walking resisted with red t-band 4 x 10' for hip and LE strengthening; cues for foot placement to increase hip abduction.            High march with 3# ankle weights; cues to increase AROM to 90 deg 2 x 40 steps alternating; pt had muscle fatigue and had to take 2-3 short standing rest breaks to complete exercise.            PT Education - 02/16/17 1502    Education provided Yes   Education Details ther-ex, Customer service manager) Educated Patient   Methods Explanation;Verbal cues;Tactile cues;Demonstration   Comprehension Verbalized understanding;Returned demonstration;Verbal cues required;Tactile cues required             PT Long Term Goals - 02/04/17 1257      PT LONG TERM GOAL #1   Title Pt will be independent with HEP to improve carryover and increase strength, mobility and gait negotiation;   Time 8   Period Weeks   Status On-going     PT LONG TERM GOAL #2   Title Pt will decrease the 5 x sit to stand by 30 seconds in order to decrease risk of fall and improve function;   Baseline 68 sec; (8/15: 34 sec)   Time 8   Period Weeks   Status Achieved     PT LONG TERM GOAL #3   Title Pt will negotiate 4, 6" stairs with R rail assist independently with no increase in pain in order to improve ability to get in/out of home;   Time 8   Period Weeks   Status Achieved     PT LONG TERM GOAL #4   Title Pt will increase LE strength bilaterally to 4+/5 in order to improve sit to stand and improve tolerance to functional activities;   Time 8   Period Weeks    Status On-going     PT LONG TERM GOAL #5   Title Pt will  increase LEFS to 70/80 in order to improve function and daily activities;    Baseline 56; (8/15: 60)   Time 8   Period Weeks   Status On-going               Plan - 02/16/17 1504    Clinical Impression Statement  Pt instructed in ther-ex for LE strengthening in order to decrease knee pain and increase functional mobility. Pt benefitted from warm up on crosstrainer, leg press exercise, and standing exercise in parallel bars. Pt performed well with moderate cueing for technique on leg press. Pt expressed that she wants to work on stepping activities more to enable her to climb the stairs with less difficulty. Pt tolerated exercise well with little knee discomfort. Pt will benefit from continued skilled PT to maximize her functional mobility and decreased her knee pain.   Rehab Potential Fair   Clinical Impairments Affecting Rehab Potential negative-chronic, lives alone, back pain, age, fall risk, BLE, not keeping up with HEP from spring; positive- motivation to decrease pain; independent   PT Frequency 2x / week   PT Duration 8 weeks   PT Treatment/Interventions ADLs/Self Care Home Management;Gait training;Stair training;Neuromuscular re-education;Energy conservation;Functional mobility training;Therapeutic activities;Therapeutic exercise;Moist Heat;Manual techniques;Passive range of motion;Balance training;Cryotherapy;Aquatic Therapy;Electrical Stimulation;Ultrasound;Patient/family education;Taping;Dry needling   PT Next Visit Plan patella mobs; strengthening; consider filling out LEFs   PT Home Exercise Plan see pt instructions; adjusted HEP given from orthopedic doctor   Consulted and Agree with Plan of Care Patient      Patient will benefit from skilled therapeutic intervention in order to improve the following deficits and impairments:  Pain, Decreased mobility, Decreased activity tolerance, Decreased endurance, Decreased  range of motion, Decreased strength, Difficulty walking, Decreased balance, Hypomobility, Impaired sensation, Increased edema  Visit Diagnosis: Chronic pain of right knee  Chronic pain of left knee  Muscle weakness (generalized)     Problem List Patient Active Problem List   Diagnosis Date Noted  . Right lumbar radiculopathy 09/28/2015    Class: Chronic  . Painful orthopaedic hardware ([place]) 09/28/2015    Class: Chronic   Xiomar Crompton M Aryav Wimberly, SPT This entire session was performed under direct supervision and direction of a licensed therapist/therapist assistant . I have personally read, edited and approve of the note as written.  Trotter,Margaret PT, DPT 02/16/2017, 4:57 PM  [place] MAIN [facility] SERVICES [address] [place], [place], [address] Phone: [phone]   Fax:  [phone]  Name: QUINITA KOSTELECKY MRN: [id number] Date of Birth: 07-24-1931

## 2017-02-18 ENCOUNTER — Encounter: Payer: Self-pay | Admitting: Physical Therapy

## 2017-02-18 ENCOUNTER — Ambulatory Visit: Payer: PPO | Admitting: Physical Therapy

## 2017-02-18 DIAGNOSIS — M25562 Pain in left knee: Secondary | ICD-10-CM | POA: Diagnosis not present

## 2017-02-18 DIAGNOSIS — M25561 Pain in right knee: Principal | ICD-10-CM

## 2017-02-18 DIAGNOSIS — G8929 Other chronic pain: Secondary | ICD-10-CM

## 2017-02-18 DIAGNOSIS — M6281 Muscle weakness (generalized): Secondary | ICD-10-CM

## 2017-02-18 NOTE — Therapy (Signed)
[place] MAIN [facility] SERVICES [address] [place], [place], [address] Phone: [phone]   Fax:  [phone]  Physical Therapy Treatment  Patient Details  Name: Samantha Garcia MRN: [id number] Date of Birth: 1932-05-19 Referring Provider: Basil Dess  Encounter Date: 02/18/2017      PT End of Session - 02/18/17 1346    Visit Number 13   Number of Visits 16   Date for PT Re-Evaluation 03/03/17   Authorization Type gcode 3   Authorization Time Period 10   PT Start Time 1301   PT Stop Time 1344   PT Time Calculation (min) 43 min   Activity Tolerance Patient tolerated treatment well;No increased pain   Behavior During Therapy WFL for tasks assessed/performed      Past Medical History:  Diagnosis Date  . Arthritis    B Knee  . Cancer ([place])    Left Breast; 5 years cancer free(7/17)  . Complication of anesthesia    Pt stated it took her a long time to wake up after having Hysterectomy in the 70s, but has not had any issues since  . Dysrhythmia    Atrial Fibrillation  . Hypertension    controlled by medication  . PPD positive 1968    Past Surgical History:  Procedure Laterality Date  . ABDOMINAL HYSTERECTOMY  09/20/75  . APPENDECTOMY    . BACK SURGERY  03/12/10  . BREAST LUMPECTOMY Left   . BREAST SURGERY Left 11/14/10   DCIS carcinoma InSitu   . CARDIAC CATHETERIZATION    . CATARACT EXTRACTION Bilateral   . COLONOSCOPY W/ POLYPECTOMY    . COLPORRHAPHY  02/15/01   Anterior repair   . HARDWARE REMOVAL N/A 09/28/2015   Procedure: REMOVAL OF RIGHT PEDICLE SCREWS AND ROD L3, L4, L5 Wiltse approach;  Surgeon: Jessy Oto, MD;  Location: [place];  Service: Orthopedics;  Laterality: N/A;  . ROTATOR CUFF REPAIR Left 06/15/08    There were no vitals filed for this visit.      Subjective Assessment - 02/18/17 1306    Subjective Pt reports she is doing well today, no pain at this time; she continues to have morning pain requiring her to  take pain meds every morning that allow her to get up with min pain.   Pertinent History 83 yo Female presents to therapy for bilateral knee osteoarthritis. Pt reports she used to run and had to stop because of knee pain a while ago; Pt has arthritis in knees, back and hands; Pt had back surgery one year ago in April to remove the hardware in low back; the screws had loosened up and was aggravating a nerve; Pt symptoms resolved completely immediately after surgery for 3 months; back pain started coming back July 2017 and the knee arthritis had also further developed; Pt had gotten better in february after therapy for the knees; but the pain and stiffness has increased to where she couldnt get out of bed; takes tramadol 2x day but tries to limit that; Pt has not been able to keep up with HEP from previous therapy and not feeling well; Pt reported a fall about 1 month ago;   Limitations Standing;Walking;Sitting;Lifting;House hold activities   How long can you sit comfortably? couple hours   How long can you stand comfortably? longer with feet apart about 30 min   How long can you walk comfortably? 100 ft   Diagnostic tests xray shows arthritis in bilateral knees;  Patient Stated Goals climb stairs with no pain;   Currently in Pain? No/denies   Pain Onset More than a month ago   Pain Onset More than a month ago        Treatment:   Manual therapy     Joint mobilizations in AP and PA directions grade IV tibia on femur in order to increase pain free knee flexion and extension to enable ther-ex for LEs 2 x 30 sec in each direction on each side.    Ther-ex:   Supine:    Bridges 2 x 15 each side with cues to start with partial ROM and increase to full ROM with second set; pt had slight pin radiating down posterior RLE with exercise at 3-4/10 that did not get worse with continued exercise. Pt reports that pain had stopped by end of exercise.   SLR with 2# ankle weight 2 x 10 each side; pt reports  exercise is not too difficult; cues to slow down eccentric motion.  Standing:   Heel raises x 20 reps each with cues to go up on on two feet and down on one for increased resistance with eccentric motion; pt reported this exercise was not too difficult; cues to slow down eccentric motion for better muscle activation    Hip ext 1 x 15 each side with cues to avoid forward trunk lean compensation                High march with 3# ankle weights; cues to increase AROM to 90 deg 2 x 20 steps alternating on each side.        PT Education - 02/18/17 1318    Education provided Yes   Education Details Ther-ex, HEP updated   Person(s) Educated Patient   Methods Explanation;Demonstration;Tactile cues;Verbal cues;Handout   Comprehension Verbalized understanding;Verbal cues required;Returned demonstration;Tactile cues required             PT Long Term Goals - 02/04/17 1257      PT LONG TERM GOAL #1   Title Pt will be independent with HEP to improve carryover and increase strength, mobility and gait negotiation;   Time 8   Period Weeks   Status On-going     PT LONG TERM GOAL #2   Title Pt will decrease the 5 x sit to stand by 30 seconds in order to decrease risk of fall and improve function;   Baseline 68 sec; (8/15: 34 sec)   Time 8   Period Weeks   Status Achieved     PT LONG TERM GOAL #3   Title Pt will negotiate 4, 6" stairs with R rail assist independently with no increase in pain in order to improve ability to get in/out of home;   Time 8   Period Weeks   Status Achieved     PT LONG TERM GOAL #4   Title Pt will increase LE strength bilaterally to 4+/5 in order to improve sit to stand and improve tolerance to functional activities;   Time 8   Period Weeks   Status On-going     PT LONG TERM GOAL #5   Title Pt will increase LEFS to 70/80 in order to improve function and daily activities;    Baseline 56; (8/15: 60)   Time 8   Period Weeks   Status On-going                Plan - 02/18/17 1347    Clinical Impression Statement Pt benefited  from knee mobilization for increased AROM and decreased pain followed by ther-ex in supine and standing for general LE strengthening. Pt performed well with minor increase in pain during bridge exercise that decreased during continued exercise. Pt is doing will with HEP; HEP updated with bridge and hip extension exercise. Pt will benefit from continued PT to maximize her functional mobility and safety and to minimize disability due to pain.   Rehab Potential Fair   Clinical Impairments Affecting Rehab Potential negative-chronic, lives alone, back pain, age, fall risk, BLE, not keeping up with HEP from spring; positive- motivation to decrease pain; independent   PT Frequency 2x / week   PT Duration 8 weeks   PT Treatment/Interventions ADLs/Self Care Home Management;Gait training;Stair training;Neuromuscular re-education;Energy conservation;Functional mobility training;Therapeutic activities;Therapeutic exercise;Moist Heat;Manual techniques;Passive range of motion;Balance training;Cryotherapy;Aquatic Therapy;Electrical Stimulation;Ultrasound;Patient/family education;Taping;Dry needling   PT Next Visit Plan patella mobs; strengthening; consider filling out LEFs   PT Home Exercise Plan see pt instructions; adjusted HEP given from orthopedic doctor   Consulted and Agree with Plan of Care Patient      Patient will benefit from skilled therapeutic intervention in order to improve the following deficits and impairments:  Pain, Decreased mobility, Decreased activity tolerance, Decreased endurance, Decreased range of motion, Decreased strength, Difficulty walking, Decreased balance, Hypomobility, Impaired sensation, Increased edema  Visit Diagnosis: Chronic pain of right knee  Chronic pain of left knee  Muscle weakness (generalized)     Problem List Patient Active Problem List   Diagnosis Date Noted  . Right  lumbar radiculopathy 09/28/2015    Class: Chronic  . Painful orthopaedic hardware ([place]) 09/28/2015    Class: Chronic   Hawthorne Day M Fareedah Mahler, SPT This entire session was performed under direct supervision and direction of a licensed therapist/therapist assistant . I have personally read, edited and approve of the note as written.  Trotter,Margaret PT, DPT 02/18/2017, 2:13 PM  [place] MAIN [facility] SERVICES [address] [place], [place], [address] Phone: [phone]   Fax:  [phone]  Name: Samantha Garcia MRN: [id number] Date of Birth: [birth year]

## 2017-02-18 NOTE — Patient Instructions (Addendum)
Bridge    Lie back, legs bent. Inhale, pressing hips up. Keeping ribs in, lengthen lower back. Exhale, rolling down along spine from top. Repeat ____ times. Do ____ sessions per day.  http://pm.exer.us/55   Copyright  VHI. All rights reserved.  March    Lift knee toward chest to 90 bend, then lower leg as knee is straightened. Session: March ____ minutes. Do ____ sessions per week. Arm movement: Swing, elbows straight (UEP-1) Breaststroke (UEP-3) Overhand crawl (UEP-4) Move: Forward   Copyright  VHI. All rights reserved.

## 2017-02-19 ENCOUNTER — Encounter (INDEPENDENT_AMBULATORY_CARE_PROVIDER_SITE_OTHER): Payer: Self-pay | Admitting: Specialist

## 2017-02-19 ENCOUNTER — Ambulatory Visit (INDEPENDENT_AMBULATORY_CARE_PROVIDER_SITE_OTHER): Payer: PPO | Admitting: Specialist

## 2017-02-19 VITALS — BP 126/71 | HR 64 | Ht 64.5 in | Wt 150.0 lb

## 2017-02-19 DIAGNOSIS — M47814 Spondylosis without myelopathy or radiculopathy, thoracic region: Secondary | ICD-10-CM

## 2017-02-19 DIAGNOSIS — M419 Scoliosis, unspecified: Secondary | ICD-10-CM

## 2017-02-19 DIAGNOSIS — M47816 Spondylosis without myelopathy or radiculopathy, lumbar region: Secondary | ICD-10-CM | POA: Diagnosis not present

## 2017-02-19 DIAGNOSIS — M4316 Spondylolisthesis, lumbar region: Secondary | ICD-10-CM | POA: Diagnosis not present

## 2017-02-19 NOTE — Progress Notes (Addendum)
Office Visit Note   Patient: Samantha Garcia           Date of Birth: 1932/06/06           MRN: [id number] Visit Date: 02/19/2017              Requested by: No referring provider defined for this encounter. PCP: System, Pcp Not In   Assessment & Plan: Visit Diagnoses:  1. Scoliosis of thoracolumbar spine, unspecified scoliosis type   2. Spondylosis, thoracic, without myelopathy   3. Spondylosis without myelopathy or radiculopathy, lumbar region   4. Spondylolisthesis, lumbar region    Avoid frequent bending and stooping  No lifting greater than 10 lbs. May use ice or moist heat for pain. Weight loss is of benefit. Handicap license is approved. Call for renewal of the tramadol.  Follow-Up Instructions: Return in about 3 years (around 02/20/2020).   Orders:  No orders of the defined types were placed in this encounter.  No orders of the defined types were placed in this encounter.     Procedures: No procedures performed   Clinical Data: Findings:  Plain radiographs demonstrate L3-4 and L4-5 XLIFs with right lateral subluxation of the L4-5 cage a few mm. There is a right sided scoliosis curve that continues up into the mid-lower 1/3 thoracic area from the area of lumbar fusion. Lateral radiograph with a grade 2 anterolisthesis of L5-S1 with increase in lordosis of the lumbar spine and opening of the L5-S1 disc space anteriorly. Degenerative disc narrowing L2-3 with endplate sclerosis and anterior osteophytes.    Subjective: Chief Complaint  Patient presents with  . Lower Back - Follow-up    83 year old female with history of right leg and buttock pain. Previous L4-5 fusion with painful hardware, right L5 pedicle screw encroaching on the right L5 neuroforamen. She had good relief two years ago with transforamenal ESI then pain returned and a removal of hardware again improved her pain. Now the pain has worsened and she has under gone a right L5-S1 transforamenal ESI  with no help at all. The injection was very painful unlike previous injection. Pain,is in the right upper back with the scoliosis and also with pain in the lower back and into the right side. In the morning she is extremely stiff,  Takes tramadol and tylenol for pain and after 45 min she can start to try and get up and decrease the pain.     Review of Systems  Constitutional: Negative.   HENT: Negative.   Eyes: Negative.   Respiratory: Negative.   Cardiovascular: Negative.   Gastrointestinal: Negative.   Endocrine: Negative.   Genitourinary: Negative.   Musculoskeletal: Negative.   Skin: Negative.   Allergic/Immunologic: Negative.   Neurological: Negative.   Hematological: Negative.   Psychiatric/Behavioral: Negative.      Objective: Vital Signs: BP 126/71 (BP Location: Left Arm, Patient Position: Sitting)   Pulse 64   Ht 5' 4.5" (1.638 m)   Wt 150 lb (68 kg)   BMI 25.35 kg/m   Physical Exam  Constitutional: She is oriented to person, place, and time. She appears well-developed and well-nourished.  HENT:  Head: Normocephalic and atraumatic.  Eyes: Pupils are equal, round, and reactive to light. EOM are normal.  Neck: Normal range of motion. Neck supple.  Pulmonary/Chest: Effort normal and breath sounds normal.  Abdominal: Soft. Bowel sounds are normal.  Neurological: She is alert and oriented to person, place, and time.  Skin: Skin is warm  and dry.  Psychiatric: She has a normal mood and affect. Her behavior is normal. Judgment and thought content normal.    Back Exam   Tenderness  The patient is experiencing tenderness in the lumbar.  Range of Motion  Extension: abnormal  Flexion: abnormal  Lateral Bend Right: abnormal  Lateral Bend Left: abnormal  Rotation Right: abnormal  Rotation Left: abnormal   Muscle Strength  Right Quadriceps:  5/5  Left Quadriceps:  5/5  Right Hamstrings:  5/5   Tests  Straight leg raise right: negative Straight leg raise left:  negative  Reflexes  Patellar: normal Biceps: normal Babinski's sign: normal   Other  Toe Walk: normal Heel Walk: normal Sensation: normal  Comments:  Right sided rib hump with a thoracolumbar scoliosis decompensated to the right of the midline.      Specialty Comments:  No specialty comments available.  Imaging: No results found.   PMFS History: Patient Active Problem List   Diagnosis Date Noted  . Right lumbar radiculopathy 09/28/2015    Priority: High    Class: Chronic  . Painful orthopaedic hardware ([place]) 09/28/2015    Priority: High    Class: Chronic   Past Medical History:  Diagnosis Date  . Arthritis    B Knee  . Cancer ([place])    Left Breast; 5 years cancer free(7/17)  . Complication of anesthesia    Pt stated it took her a long time to wake up after having Hysterectomy in the 70s, but has not had any issues since  . Dysrhythmia    Atrial Fibrillation  . Hypertension    controlled by medication  . PPD positive 1968    No family history on file.  Past Surgical History:  Procedure Laterality Date  . ABDOMINAL HYSTERECTOMY  09/20/75  . APPENDECTOMY    . BACK SURGERY  03/12/10  . BREAST LUMPECTOMY Left   . BREAST SURGERY Left 11/14/10   DCIS carcinoma InSitu   . CARDIAC CATHETERIZATION    . CATARACT EXTRACTION Bilateral   . COLONOSCOPY W/ POLYPECTOMY    . COLPORRHAPHY  02/15/01   Anterior repair   . HARDWARE REMOVAL N/A 09/28/2015   Procedure: REMOVAL OF RIGHT PEDICLE SCREWS AND ROD L3, L4, L5 Wiltse approach;  Surgeon: Jessy Oto, MD;  Location: [place];  Service: Orthopedics;  Laterality: N/A;  . ROTATOR CUFF REPAIR Left 06/15/08   Social History   Occupational History  . Not on file.   Social History Main Topics  . Smoking status: Former Smoker    Packs/day: 1.00    Years: 3.00    Types: Cigarettes    Quit date: 06/23/1965  . Smokeless tobacco: Never Used  . Alcohol use No  . Drug use: No  . Sexual activity: Not on file

## 2017-02-19 NOTE — Patient Instructions (Addendum)
Avoid frequent bending and stooping  No lifting greater than 10 lbs. May use ice or moist heat for pain. Weight loss is of benefit. Handicap license is approved. Call for renewal of the tramadol.

## 2017-02-24 ENCOUNTER — Ambulatory Visit: Payer: PPO | Attending: Specialist | Admitting: Physical Therapy

## 2017-02-24 ENCOUNTER — Encounter: Payer: Self-pay | Admitting: Physical Therapy

## 2017-02-24 DIAGNOSIS — M6281 Muscle weakness (generalized): Secondary | ICD-10-CM | POA: Diagnosis not present

## 2017-02-24 DIAGNOSIS — G8929 Other chronic pain: Secondary | ICD-10-CM | POA: Insufficient documentation

## 2017-02-24 DIAGNOSIS — M25562 Pain in left knee: Secondary | ICD-10-CM | POA: Insufficient documentation

## 2017-02-24 DIAGNOSIS — M25561 Pain in right knee: Secondary | ICD-10-CM | POA: Diagnosis not present

## 2017-02-24 NOTE — Therapy (Signed)
[place] MAIN [facility] SERVICES [address] [place], [place], [address] Phone: [phone]   Fax:  [phone]  Physical Therapy Treatment  Patient Details  Name: Samantha Garcia MRN: [id number] Date of Birth: 1932-04-19 Referring Provider: Basil Dess  Encounter Date: 02/24/2017      PT End of Session - 02/24/17 1107    Visit Number 14   Number of Visits 16   Date for PT Re-Evaluation 03/03/17   Authorization Type gcode 4   Authorization Time Period 10   PT Start Time 1102   PT Stop Time 1145   PT Time Calculation (min) 43 min   Activity Tolerance Patient tolerated treatment well;No increased pain   Behavior During Therapy WFL for tasks assessed/performed      Past Medical History:  Diagnosis Date  . Arthritis    B Knee  . Cancer ([place])    Left Breast; 5 years cancer free(7/17)  . Complication of anesthesia    Pt stated it took her a long time to wake up after having Hysterectomy in the 70s, but has not had any issues since  . Dysrhythmia    Atrial Fibrillation  . Hypertension    controlled by medication  . PPD positive 1968    Past Surgical History:  Procedure Laterality Date  . ABDOMINAL HYSTERECTOMY  09/20/75  . APPENDECTOMY    . BACK SURGERY  03/12/10  . BREAST LUMPECTOMY Left   . BREAST SURGERY Left 11/14/10   DCIS carcinoma InSitu   . CARDIAC CATHETERIZATION    . CATARACT EXTRACTION Bilateral   . COLONOSCOPY W/ POLYPECTOMY    . COLPORRHAPHY  02/15/01   Anterior repair   . HARDWARE REMOVAL N/A 09/28/2015   Procedure: REMOVAL OF RIGHT PEDICLE SCREWS AND ROD L3, L4, L5 Wiltse approach;  Surgeon: Jessy Oto, MD;  Location: [place];  Service: Orthopedics;  Laterality: N/A;  . ROTATOR CUFF REPAIR Left 06/15/08    There were no vitals filed for this visit.      Subjective Assessment - 02/24/17 1105    Subjective Patient reports going to see orthopedic MD last week. She reports that he is going to order an MRI; She  reports no pain currently; She reports still having deep ache in the morning that is relieved with activity;    Pertinent History 83 yo Female presents to therapy for bilateral knee osteoarthritis. Pt reports she used to run and had to stop because of knee pain a while ago; Pt has arthritis in knees, back and hands; Pt had back surgery one year ago in April to remove the hardware in low back; the screws had loosened up and was aggravating a nerve; Pt symptoms resolved completely immediately after surgery for 3 months; back pain started coming back July 2017 and the knee arthritis had also further developed; Pt had gotten better in february after therapy for the knees; but the pain and stiffness has increased to where she couldnt get out of bed; takes tramadol 2x day but tries to limit that; Pt has not been able to keep up with HEP from previous therapy and not feeling well; Pt reported a fall about 1 month ago;   Limitations Standing;Walking;Sitting;Lifting;House hold activities   How long can you sit comfortably? couple hours   How long can you stand comfortably? longer with feet apart about 30 min   How long can you walk comfortably? 100 ft   Diagnostic tests xray shows arthritis in bilateral  knees;    Patient Stated Goals climb stairs with no pain;   Currently in Pain? No/denies   Pain Onset More than a month ago   Pain Onset More than a month ago      TREATMENT: Warm up on Crosstrainer, BUE/BLE level 0 x 53min (unbilled);  Standing terminal knee extension with small green ball against wall 3 sec hold x15 bilaterally; required min VCs for positioning and to increase hold time for better strengthening;    Forward step ups on 4 inch step x5 each LE leading with 2 rail assist with cues to increase hip abduction/extension to utilize hip and reduce stress on knee; Side step up and over 4 inch step up and over with 2 HHA on rails x5 each direction; Patient denies any pain when negotiating a 4inch  step;   Seated: Blue tband hamstring curl 2x10 bilaterally; Patient required min Vcs to slow down eccentric return for better motor control and strengthening;  Leg Press: BLE plate 90# 1Z00 with cues to slow down LE movement and to improve knee positioning for strengthening;    Tower, Single leg donkey kick with 2.5# x10 with mod Vcs for positioning and exercise technique;   Forward lunges on BOSU x10 bilaterally with cues to reduce UE weight bearing and to push off front leg for better lunge technique;  Standing forward march 3# x15 bilaterally; Standing hip abduction 3# x15 bilaterally; Standing heel raise 3# x15 bilaterally; Patient required min-moderate verbal/tactile cues for correct exercise technique including to avoid lean for better hip strengthening;   Provided written handout for aquatic therapy as patient will be transitioning to the pool in the next week.                     PT Education - 02/24/17 1106    Education provided Yes   Education Details exercise, HEP reinforced; aquatic therapy plan;    Person(s) Educated Patient   Methods Explanation;Demonstration;Verbal cues   Comprehension Verbalized understanding;Verbal cues required;Returned demonstration;Need further instruction             PT Long Term Goals - 02/04/17 1257      PT LONG TERM GOAL #1   Title Pt will be independent with HEP to improve carryover and increase strength, mobility and gait negotiation;   Time 8   Period Weeks   Status On-going     PT LONG TERM GOAL #2   Title Pt will decrease the 5 x sit to stand by 30 seconds in order to decrease risk of fall and improve function;   Baseline 68 sec; (8/15: 34 sec)   Time 8   Period Weeks   Status Achieved     PT LONG TERM GOAL #3   Title Pt will negotiate 4, 6" stairs with R rail assist independently with no increase in pain in order to improve ability to get in/out of home;   Time 8   Period Weeks   Status Achieved      PT LONG TERM GOAL #4   Title Pt will increase LE strength bilaterally to 4+/5 in order to improve sit to stand and improve tolerance to functional activities;   Time 8   Period Weeks   Status On-going     PT LONG TERM GOAL #5   Title Pt will increase LEFS to 70/80 in order to improve function and daily activities;    Baseline 56; (8/15: 60)   Time 8   Period Weeks  Status On-going               Plan - 02/24/17 1129    Clinical Impression Statement Patient instructed in advanced LE strengthening exercise. Patient continues to require cues for positioning and to slow down LE movement to improve motor control and strength. Patient able to negotiate 4 inch step without difficulty and without pain. She continues to have pain early in the morning that is relieved with activities. Patient would benefit from additional skilled PT intervention to improve strength, balance and gait safety;    Rehab Potential Fair   Clinical Impairments Affecting Rehab Potential negative-chronic, lives alone, back pain, age, fall risk, BLE, not keeping up with HEP from spring; positive- motivation to decrease pain; independent   PT Frequency 2x / week   PT Duration 8 weeks   PT Treatment/Interventions ADLs/Self Care Home Management;Gait training;Stair training;Neuromuscular re-education;Energy conservation;Functional mobility training;Therapeutic activities;Therapeutic exercise;Moist Heat;Manual techniques;Passive range of motion;Balance training;Cryotherapy;Aquatic Therapy;Electrical Stimulation;Ultrasound;Patient/family education;Taping;Dry needling   PT Next Visit Plan patella mobs; strengthening; consider filling out LEFs   PT Home Exercise Plan see pt instructions; adjusted HEP given from orthopedic doctor   Consulted and Agree with Plan of Care Patient      Patient will benefit from skilled therapeutic intervention in order to improve the following deficits and impairments:  Pain, Decreased mobility,  Decreased activity tolerance, Decreased endurance, Decreased range of motion, Decreased strength, Difficulty walking, Decreased balance, Hypomobility, Impaired sensation, Increased edema  Visit Diagnosis: Chronic pain of right knee  Chronic pain of left knee  Muscle weakness (generalized)     Problem List Patient Active Problem List   Diagnosis Date Noted  . Right lumbar radiculopathy 09/28/2015    Class: Chronic  . Painful orthopaedic hardware ([place]) 09/28/2015    Class: Chronic    Mystery Schrupp PT, DPT 02/24/2017, 11:54 AM  [place] MAIN [facility] SERVICES [address] [place], [place], [address] Phone: [phone]   Fax:  [phone]  Name: Samantha Garcia MRN: [id number] Date of Birth: 22-Feb-1932

## 2017-02-27 ENCOUNTER — Telehealth (INDEPENDENT_AMBULATORY_CARE_PROVIDER_SITE_OTHER): Payer: Self-pay | Admitting: *Deleted

## 2017-02-27 NOTE — Telephone Encounter (Signed)
Pt has appt for MRI schedule at [facility] on Friday Sept 14 at 9am, pt is to arrive at 815am for blood work and then exam will follow. Left message on both home and mobile fro pt to return my call for appt information

## 2017-03-02 ENCOUNTER — Encounter: Payer: Self-pay | Admitting: Physical Therapy

## 2017-03-02 ENCOUNTER — Ambulatory Visit: Payer: PPO | Admitting: Physical Therapy

## 2017-03-02 DIAGNOSIS — G8929 Other chronic pain: Secondary | ICD-10-CM

## 2017-03-02 DIAGNOSIS — M25562 Pain in left knee: Secondary | ICD-10-CM

## 2017-03-02 DIAGNOSIS — M25561 Pain in right knee: Principal | ICD-10-CM

## 2017-03-02 DIAGNOSIS — M6281 Muscle weakness (generalized): Secondary | ICD-10-CM

## 2017-03-02 NOTE — Telephone Encounter (Signed)
Pt called back and aware of appt 

## 2017-03-02 NOTE — Telephone Encounter (Signed)
Called both home and cell no answer, left vm to return call on both numbers.

## 2017-03-02 NOTE — Therapy (Signed)
[facility] MAIN [facility] SERVICES [address] [place], [place], [address] Phone: [phone]   Fax:  [phone]  Physical Therapy Treatment/ Progress Note  Patient Details  Name: Samantha Garcia MRN: [id number] Date of Birth: May 22, 1932 Referring Provider: Basil Dess  Encounter Date: 03/02/2017      PT End of Session - 03/02/17 1243    Visit Number 15   Number of Visits 28   Date for PT Re-Evaluation 04/13/17   Authorization Type gcode 5   Authorization Time Period 10   PT Start Time 1101   PT Stop Time 1203   PT Time Calculation (min) 62 min   Equipment Utilized During Treatment Gait belt   Activity Tolerance Patient tolerated treatment well;No increased pain   Behavior During Therapy WFL for tasks assessed/performed      Past Medical History:  Diagnosis Date  . Arthritis    B Knee  . Cancer ([place])    Left Breast; 5 years cancer free(7/17)  . Complication of anesthesia    Pt stated it took her a long time to wake up after having Hysterectomy in the 70s, but has not had any issues since  . Dysrhythmia    Atrial Fibrillation  . Hypertension    controlled by medication  . PPD positive 1968    Past Surgical History:  Procedure Laterality Date  . ABDOMINAL HYSTERECTOMY  09/20/75  . APPENDECTOMY    . BACK SURGERY  03/12/10  . BREAST LUMPECTOMY Left   . BREAST SURGERY Left 11/14/10   DCIS carcinoma InSitu   . CARDIAC CATHETERIZATION    . CATARACT EXTRACTION Bilateral   . COLONOSCOPY W/ POLYPECTOMY    . COLPORRHAPHY  02/15/01   Anterior repair   . HARDWARE REMOVAL N/A 09/28/2015   Procedure: REMOVAL OF RIGHT PEDICLE SCREWS AND ROD L3, L4, L5 Wiltse approach;  Surgeon: Jessy Oto, MD;  Location: [place];  Service: Orthopedics;  Laterality: N/A;  . ROTATOR CUFF REPAIR Left 06/15/08    There were no vitals filed for this visit.      Subjective Assessment - 03/02/17 1106    Subjective Pt reports she is doing ok today; she says  the weather is having a negative impact on her knee pain. Pt reports she had knee pain this morning and last night, but has no pain now. Pt is waiting to get an MRI scheduled for her LBP.  Pt reports she has not been compliant with HEP due to having company at her house lately   Pertinent History 83 yo Female presents to therapy for bilateral knee osteoarthritis. Pt reports she used to run and had to stop because of knee pain a while ago; Pt has arthritis in knees, back and hands; Pt had back surgery one year ago in April to remove the hardware in low back; the screws had loosened up and was aggravating a nerve; Pt symptoms resolved completely immediately after surgery for 3 months; back pain started coming back July 2017 and the knee arthritis had also further developed; Pt had gotten better in february after therapy for the knees; but the pain and stiffness has increased to where she couldnt get out of bed; takes tramadol 2x day but tries to limit that; Pt has not been able to keep up with HEP from previous therapy and not feeling well; Pt reported a fall about 1 month ago;   Limitations Standing;Walking;Sitting;Lifting;House hold activities   How long can you sit comfortably?  couple hours   How long can you stand comfortably? longer with feet apart about 30 min   How long can you walk comfortably? 100 ft   Diagnostic tests xray shows arthritis in bilateral knees;    Patient Stated Goals climb stairs with no pain;   Currently in Pain? No/denies   Pain Onset More than a month ago   Pain Onset More than a month ago        Treatment  Goals Re-assessed:    LEFS: Currently 45/80 indicating pt is 41-60% disabled; previously pt scored 60 on 02/04/17 indicating she was 21-40% disabled.    Strength: 4 to 4+/5 in BLE    Warm up on Crosstrainer, BUE/BLE level 4 resistance  x 74mn (unbilled);   Ther-ex:  Sitting:  Leg press:    BLE attempted 90# (as in last session), but pt reports "I can't move  it"; decreased to 75#, pt reported minor increase in knee pain; weight decreased to 60#, pt reports that this weight is better with decreased pain x 20 reps       Hamstring curls x 15 each LE resisted with green t-band; pt cued for increased eccentric control; cues to increase AROM     Standing in parallel bars:   Heel raises with single UE support and contralateral overhead reaching to cards placed on mirror above pt to front, left, and right to facilitate improved upright posture with heel raises.    Step-ups: Forward x 10 on each LE on a 6" step forward and back down with reward step; cues to use UE support for balance only; cues to step slower with more control        Zig-zag walking resisted with green t-band 4 laps x 10' each with BUE support; cues for slower eccentric step and to avoid sliding foot across floor.          PT Education - 03/02/17 1242    Education provided Yes   Education Details ther-ex, progress towards goals   Person(s) Educated Patient   Methods Explanation;Demonstration;Tactile cues;Verbal cues   Comprehension Verbal cues required;Returned demonstration;Tactile cues required;Verbalized understanding             PT Long Term Goals - 03/02/17 1131      PT LONG TERM GOAL #1   Title Pt will be independent with HEP to improve carryover and increase strength, mobility and gait negotiation;   Baseline Pt has been inconsistent with HEP due to being busy lately.    Time 6   Period Weeks   Status On-going   Target Date 04/13/17     PT LONG TERM GOAL #2   Title Pt will decrease the 5 x sit to stand by 30 seconds in order to decrease risk of fall and improve function;   Baseline 68 sec; (8/15: 34 sec)   Time 8   Period Weeks   Status Achieved     PT LONG TERM GOAL #3   Title Pt will negotiate 4, 6" stairs with R rail assist independently with no increase in pain in order to improve ability to get in/out of home;   Time 8   Period Weeks   Status  Achieved     PT LONG TERM GOAL #4   Title Pt will increase LE strength bilaterally to 4+/5 in order to improve sit to stand and improve tolerance to functional activities;   Baseline Grossly 4 to 4+/5   Time 6   Period Weeks  Status Partially Met   Target Date 04/13/17     PT LONG TERM GOAL #5   Title Pt will increase LEFS to 70/80 in order to improve function and daily activities;    Baseline 45/80 indicating pt is 41-60% disabled on 9/10   Time 6   Period Weeks   Status On-going   Target Date 04/13/17     Additional Long Term Goals   Additional Long Term Goals Yes     PT LONG TERM GOAL #6   Title Patient will report a worst knee pain of 5/10 on VAS in the last 24 hours to improve tolerance with ADLs and reduced symptoms with activities.    Baseline Pt reports 9/10 pain in lst 24 hours   Time 6   Period Weeks   Status New   Target Date 04/13/17               Plan - 03/02/17 1140    Clinical Impression Statement Pt re-assessed on her goals today including strength and the lower extremity functional scale (LEFS). LEFS score was decreased today compared to last test on 02/04/17. Pt reports that she was in an unusually high amount of knee pain last night, which may have decreased her perceived LE function. Pt also reports that she feels inconsistent when filling out this form. Strength is improved to within a half MMT grade of her goal. Pt expresses interest in independently exercising in a gym with her friends in the future to increase her activity tolerance and strength. Aquatic therapy discussed with pt with the goals of increasing strength and decreasing knee pain. Pt will benefit from continued therapy to address her residual deficits in pain, strength, and in perceived LE function   Rehab Potential Fair   Clinical Impairments Affecting Rehab Potential negative-chronic, lives alone, back pain, age, fall risk, BLE, not keeping up with HEP from spring; positive- motivation to  decrease pain; independent   PT Frequency 2x / week   PT Duration 6 weeks   PT Treatment/Interventions ADLs/Self Care Home Management;Gait training;Stair training;Neuromuscular re-education;Energy conservation;Functional mobility training;Therapeutic activities;Therapeutic exercise;Moist Heat;Manual techniques;Passive range of motion;Balance training;Cryotherapy;Aquatic Therapy;Electrical Stimulation;Ultrasound;Patient/family education;Taping;Dry needling   PT Next Visit Plan patella mobs; strengthening; consider filling out LEFs   PT Home Exercise Plan see pt instructions; adjusted HEP given from orthopedic doctor   Consulted and Agree with Plan of Care Patient      Patient will benefit from skilled therapeutic intervention in order to improve the following deficits and impairments:  Pain, Decreased mobility, Decreased activity tolerance, Decreased endurance, Decreased range of motion, Decreased strength, Difficulty walking, Decreased balance, Hypomobility, Impaired sensation, Increased edema  Visit Diagnosis: Chronic pain of right knee - Plan: PT plan of care cert/re-cert  Chronic pain of left knee - Plan: PT plan of care cert/re-cert  Muscle weakness (generalized) - Plan: PT plan of care cert/re-cert     Problem List Patient Active Problem List   Diagnosis Date Noted  . Right lumbar radiculopathy 09/28/2015    Class: Chronic  . Painful orthopaedic hardware ([place]) 09/28/2015    Class: Chronic   Alvie Speltz M Deion Swift, SPT This entire session was performed under direct supervision and direction of a licensed therapist/therapist assistant . I have personally read, edited and approve of the note as written.  Trotter,Margaret PT, DPT 03/02/2017, 1:48 PM  [place] MAIN [facility] SERVICES [address] [place], [place], [address] Phone: [phone]   Fax:  [phone]  Name: Samantha  FIFI Garcia MRN: [id number] Date of Birth: [birth year]

## 2017-03-04 ENCOUNTER — Ambulatory Visit: Payer: PPO | Admitting: Physical Therapy

## 2017-03-06 ENCOUNTER — Ambulatory Visit: Payer: PPO

## 2017-03-09 ENCOUNTER — Ambulatory Visit: Payer: PPO

## 2017-03-10 ENCOUNTER — Other Ambulatory Visit (INDEPENDENT_AMBULATORY_CARE_PROVIDER_SITE_OTHER): Payer: Self-pay | Admitting: Specialist

## 2017-03-10 ENCOUNTER — Ambulatory Visit: Payer: PPO

## 2017-03-10 MED ORDER — TRAMADOL HCL 50 MG PO TABS: 50.0000 mg | ORAL_TABLET | Freq: Two times a day (BID) | ORAL | 0 refills | Status: DC | PRN

## 2017-03-10 NOTE — Telephone Encounter (Signed)
Patient called requesting a refill on Tramadol. CB # [id number]

## 2017-03-10 NOTE — Telephone Encounter (Signed)
Pt is aware rx is called in

## 2017-03-10 NOTE — Telephone Encounter (Signed)
83 year old female with spondylolisthesis L5-S1 below previous L4-5 fusion, she has recent onset of atrial fibrillation and is on xarelto for anticoagulation. As such she is not a candidate for surgery and is undergoing physical therapy. She can not take NSAIDs due to anticoagulation and is being given tramadol for pain associated with knee and back conditions. [facility] website reviewed and she has been receiving her medication from [place] only. Rx is renewed. jen

## 2017-03-10 NOTE — Addendum Note (Signed)
Addended by: Minda Ditto, Alyse Low N on: 03/10/2017 01:54 PM   Modules accepted: Orders

## 2017-03-10 NOTE — Telephone Encounter (Signed)
[facility] 

## 2017-03-14 ENCOUNTER — Ambulatory Visit
Admission: RE | Admit: 2017-03-14 | Discharge: 2017-03-14 | Disposition: A | Discharge status: Home / Self Care | Payer: PPO | Source: Ambulatory Visit | Attending: Specialist | Admitting: Specialist

## 2017-03-14 DIAGNOSIS — M47814 Spondylosis without myelopathy or radiculopathy, thoracic region: Secondary | ICD-10-CM | POA: Diagnosis not present

## 2017-03-14 DIAGNOSIS — M47816 Spondylosis without myelopathy or radiculopathy, lumbar region: Secondary | ICD-10-CM | POA: Insufficient documentation

## 2017-03-14 DIAGNOSIS — M4316 Spondylolisthesis, lumbar region: Secondary | ICD-10-CM | POA: Diagnosis not present

## 2017-03-14 DIAGNOSIS — M4185 Other forms of scoliosis, thoracolumbar region: Secondary | ICD-10-CM | POA: Insufficient documentation

## 2017-03-14 DIAGNOSIS — M419 Scoliosis, unspecified: Secondary | ICD-10-CM

## 2017-03-14 DIAGNOSIS — M5416 Radiculopathy, lumbar region: Secondary | ICD-10-CM | POA: Diagnosis not present

## 2017-03-14 LAB — POCT I-STAT CREATININE: Creatinine, Ser: 0.9 mg/dL (ref 0.44–1.00)

## 2017-03-14 MED ORDER — GADOBENATE DIMEGLUMINE 529 MG/ML IV SOLN
14.0000 mL | Freq: Once | INTRAVENOUS | Status: AC | PRN
  Administered 2017-03-14: 14 mL via INTRAVENOUS

## 2017-03-17 ENCOUNTER — Ambulatory Visit: Payer: PPO

## 2017-03-17 DIAGNOSIS — M25562 Pain in left knee: Secondary | ICD-10-CM

## 2017-03-17 DIAGNOSIS — M25561 Pain in right knee: Principal | ICD-10-CM

## 2017-03-17 DIAGNOSIS — M6281 Muscle weakness (generalized): Secondary | ICD-10-CM

## 2017-03-17 DIAGNOSIS — G8929 Other chronic pain: Secondary | ICD-10-CM

## 2017-03-17 NOTE — Therapy (Signed)
Pt enters pool via steps and exits per therapist request via ramp. Pt participates in exercises as follows:   Ambulate fwd with noodles in B hands for support x 4 laps Ambulates side step with noodles in B hands for support x 4 laps.  Seated at bench (with assist to remain seated) - bicycle, 3 minutes - scissor kicks, 3 minutes - flutter kicks, 3 minutes  Rail mini squats, 25x  Rail hip kicks with abdominal stabilization: - flexion/extension - abd/add  Step ups, deep water - lead R and L 30x each  Step Stretching, 3 x 15 seconds each. - B hamstrings - B gastroc

## 2017-03-23 ENCOUNTER — Other Ambulatory Visit (INDEPENDENT_AMBULATORY_CARE_PROVIDER_SITE_OTHER): Payer: Self-pay | Admitting: Specialist

## 2017-03-23 NOTE — Telephone Encounter (Signed)
Gabapentin refill request 

## 2017-03-24 ENCOUNTER — Ambulatory Visit: Payer: PPO | Attending: Specialist

## 2017-03-24 DIAGNOSIS — G8929 Other chronic pain: Secondary | ICD-10-CM | POA: Diagnosis not present

## 2017-03-24 DIAGNOSIS — M25561 Pain in right knee: Secondary | ICD-10-CM | POA: Diagnosis not present

## 2017-03-24 DIAGNOSIS — M6281 Muscle weakness (generalized): Secondary | ICD-10-CM

## 2017-03-24 DIAGNOSIS — M25562 Pain in left knee: Secondary | ICD-10-CM | POA: Diagnosis not present

## 2017-03-24 NOTE — Therapy (Signed)
Pt enters/exits pool via ramp with use of rail  Fwd/Side ambulation with green dumbbells for UE support and Min guard. Occasional assist for balance. 4 laps each  Stand hip exercises: - Flexion/ext swing, focus on core stabilization, 25x - Abd/add, focus on core stabilization and proper LE position, 25x - Mini squats, 30x  Step exercises:  - Partial step ups alternating, 30x - Partial side step up, R/L 20x each  Seated exercises:  - Bicycle, 5 min - Scissor, 5 min - Flutter, 5 min  Stretching:  - B SKTC, 3 x 15 seconds - B Hamstrings, 3 x 15 seconds - B gastorcs, 3 x 15 seconds

## 2017-03-31 ENCOUNTER — Ambulatory Visit: Payer: PPO | Admitting: Physical Therapy

## 2017-03-31 ENCOUNTER — Encounter (INDEPENDENT_AMBULATORY_CARE_PROVIDER_SITE_OTHER): Payer: Self-pay | Admitting: Specialist

## 2017-03-31 ENCOUNTER — Encounter: Payer: Self-pay | Admitting: Physical Therapy

## 2017-03-31 ENCOUNTER — Ambulatory Visit (INDEPENDENT_AMBULATORY_CARE_PROVIDER_SITE_OTHER): Payer: PPO | Admitting: Specialist

## 2017-03-31 VITALS — BP 158/71 | HR 57 | Ht 64.5 in | Wt 150.0 lb

## 2017-03-31 DIAGNOSIS — M4326 Fusion of spine, lumbar region: Secondary | ICD-10-CM

## 2017-03-31 DIAGNOSIS — G8929 Other chronic pain: Secondary | ICD-10-CM

## 2017-03-31 DIAGNOSIS — M25562 Pain in left knee: Secondary | ICD-10-CM

## 2017-03-31 DIAGNOSIS — M48062 Spinal stenosis, lumbar region with neurogenic claudication: Secondary | ICD-10-CM

## 2017-03-31 DIAGNOSIS — M25561 Pain in right knee: Secondary | ICD-10-CM | POA: Diagnosis not present

## 2017-03-31 DIAGNOSIS — M6281 Muscle weakness (generalized): Secondary | ICD-10-CM

## 2017-03-31 MED ORDER — TRAMADOL HCL 50 MG PO TABS: 50.0000 mg | ORAL_TABLET | Freq: Two times a day (BID) | ORAL | 0 refills | Status: DC | PRN

## 2017-03-31 NOTE — Progress Notes (Signed)
Office Visit Note   Patient: Samantha Garcia           Date of Birth: 08/01/1931           MRN: [id number] Visit Date: 03/31/2017              Requested by: No referring provider defined for this encounter. PCP: Carolan Shiver, MD   Assessment & Plan: Visit Diagnoses:  1. Spinal stenosis of lumbar region with neurogenic claudication     Plan: Avoid bending, stooping and avoid lifting weights greater than 10 lbs. Avoid prolong standing and walking. Avoid frequent bending and stooping  No lifting greater than 10 lbs. May use ice or moist heat for pain. Weight loss is of benefit. Handicap license is approved.   Follow-Up Instructions: No Follow-up on file.   Orders:  No orders of the defined types were placed in this encounter.  Meds ordered this encounter  Medications  . traMADol (ULTRAM) 50 MG tablet    Sig: Take 1 tablet (50 mg total) by mouth every 12 (twelve) hours as needed. for pain    Dispense:  180 tablet    Refill:  0    Not to exceed 5 additional fills before 06/24/2017.      Procedures: No procedures performed   Clinical Data: No additional findings.   Subjective: Chief Complaint  Patient presents with  . Lower Back - Follow-up    MRI Review-Lumbar    83 year old female with history of L3-5 fusion for stenosis, she has residual scoliosis above the fusion site and a spondylolisthesis at L5-S1. Underwent removal of  The right sided pedicle screws and rods were removed. She did well for a period of about 3-4 months with recurrent symptoms of right L5 nerve irritation. Underwent a Follow up MRI after a right L5 transforamenal ESI was not of benefit. No bowel or bladder symptoms.    Review of Systems  Constitutional: Negative.   HENT: Negative.   Eyes: Negative.   Respiratory: Negative.   Cardiovascular: Negative.   Gastrointestinal: Negative.   Endocrine: Negative.   Genitourinary: Negative.   Musculoskeletal: Negative.   Skin: Negative.    Allergic/Immunologic: Negative.   Neurological: Negative.   Hematological: Negative.   Psychiatric/Behavioral: Negative.      Objective: Vital Signs: BP (!) 158/71 (BP Location: Left Arm, Patient Position: Sitting)   Pulse (!) 57   Ht 5' 4.5" (1.638 m)   Wt 150 lb (68 kg)   BMI 25.35 kg/m   Physical Exam  Constitutional: She is oriented to person, place, and time. She appears well-developed and well-nourished.  HENT:  Head: Normocephalic and atraumatic.  Eyes: Pupils are equal, round, and reactive to light. EOM are normal.  Neck: Normal range of motion. Neck supple.  Pulmonary/Chest: Effort normal and breath sounds normal.  Abdominal: Soft. Bowel sounds are normal.  Neurological: She is alert and oriented to person, place, and time.  Skin: Skin is warm and dry.  Psychiatric: She has a normal mood and affect. Her behavior is normal. Judgment and thought content normal.    Back Exam   Tenderness  The patient is experiencing tenderness in the lumbar.  Range of Motion  Extension: abnormal  Flexion: normal  Lateral Bend Right: abnormal  Lateral Bend Left: abnormal  Rotation Right: abnormal  Rotation Left: abnormal   Muscle Strength  Right Quadriceps:  5/5  Left Quadriceps:  5/5  Right Hamstrings:  5/5  Left Hamstrings:  5/5  Tests  Straight leg raise right: negative Straight leg raise left: negative  Reflexes  Patellar: normal Achilles:  Hyperreflexic normal Babinski's sign: normal   Other  Toe Walk: normal Heel Walk: normal Sensation: normal Gait: normal  Erythema: no back redness Scars: absent  Comments:  Right EHL is normal, SLR in negative.      Specialty Comments:  No specialty comments available.  Imaging: No results found.   PMFS History: Patient Active Problem List   Diagnosis Date Noted  . Right lumbar radiculopathy 09/28/2015    Priority: High    Class: Chronic  . Painful orthopaedic hardware ([place]) 09/28/2015    Priority:  High    Class: Chronic   Past Medical History:  Diagnosis Date  . Arthritis    B Knee  . Cancer ([place])    Left Breast; 5 years cancer free(7/17)  . Complication of anesthesia    Pt stated it took her a long time to wake up after having Hysterectomy in the 70s, but has not had any issues since  . Dysrhythmia    Atrial Fibrillation  . Hypertension    controlled by medication  . PPD positive 1968    No family history on file.  Past Surgical History:  Procedure Laterality Date  . ABDOMINAL HYSTERECTOMY  09/20/75  . APPENDECTOMY    . BACK SURGERY  03/12/10  . BREAST LUMPECTOMY Left   . BREAST SURGERY Left 11/14/10   DCIS carcinoma InSitu   . CARDIAC CATHETERIZATION    . CATARACT EXTRACTION Bilateral   . COLONOSCOPY W/ POLYPECTOMY    . COLPORRHAPHY  02/15/01   Anterior repair   . HARDWARE REMOVAL N/A 09/28/2015   Procedure: REMOVAL OF RIGHT PEDICLE SCREWS AND ROD L3, L4, L5 Wiltse approach;  Surgeon: Jessy Oto, MD;  Location: [place];  Service: Orthopedics;  Laterality: N/A;  . ROTATOR CUFF REPAIR Left 06/15/08   Social History   Occupational History  . Not on file.   Social History Main Topics  . Smoking status: Former Smoker    Packs/day: 1.00    Years: 3.00    Types: Cigarettes    Quit date: 06/23/1965  . Smokeless tobacco: Never Used  . Alcohol use No  . Drug use: No  . Sexual activity: Not on file

## 2017-03-31 NOTE — Patient Instructions (Signed)
Plan: Avoid bending, stooping and avoid lifting weights greater than 10 lbs. Avoid prolong standing and walking. Avoid frequent bending and stooping  No lifting greater than 10 lbs. May use ice or moist heat for pain. Weight loss is of benefit. Handicap license is approved. 

## 2017-03-31 NOTE — Patient Instructions (Addendum)
Balance, Proprioception: Hip Abduction With Tubing   With tubing attached to both ankles, Standing holding onto counter, kick one leg out to side and then Return.  Repeat _10___ times  On each side.  Do ___2_ sessions per day.  http://cc.exer.us/20    Copyright  VHI. All rights reserved.  Balance, Proprioception: Hip Extension With Tubing   With tubing tied around both legs, holding onto kitchen counter, swing leg back. Return. Repeat _10___ times . Do __2__ sessions per day.  http://cc.exer.us/19     Copyright  VHI. All rights reserved.  Band Walk: Side Stepping   Tie band around legs,around ankles. Step _10__ feet to one side, then step back to start. Repeat _2-3__ feet per session. Note: Small towel between band and skin eases rubbing.  http://plyo.exer.us/76   Copyright  VHI. All rights reserved.   Terminal Knee Extension    Lying on back with rolled towel (about 6 inches wide) under knee, slowly straighten knee to fully extended position. Hold _2___ seconds, then relax. Repeat with other knee. Repeat __10__ times. Do _2___ sessions per day.  http://[email or web address].[email or web address]   Copyright  VHI. All rights reserved.  Terminal Knee Extension (Standing)    Facing anchor with right knee slightly bent and tubing just above knee, gently pull knee back straight. Do not overextend knee. Repeat _10___ times per set. Do _2___ sets per session. Do _2___ sessions per day.  http://[email or web address].exer.[email or web address]   Copyright  VHI. All rights reserved.

## 2017-03-31 NOTE — Therapy (Signed)
[place] MAIN [facility] SERVICES [address] [place], [place], [address] Phone: [phone]   Fax:  [phone]  Physical Therapy Treatment/Progress Note  Patient Details  Name: Samantha Garcia MRN: [id number] Date of Birth: 09-30-1931 Referring Provider: Basil Dess  Encounter Date: 03/31/2017      PT End of Session - 03/31/17 1127    Visit Number 18   Number of Visits 36   Date for PT Re-Evaluation 05/26/17   Authorization Type gcode 1   Authorization Time Period 10   PT Start Time 1120   PT Stop Time 1200   PT Time Calculation (min) 40 min   Activity Tolerance Patient tolerated treatment well;No increased pain   Behavior During Therapy WFL for tasks assessed/performed      Past Medical History:  Diagnosis Date  . Arthritis    B Knee  . Cancer ([place])    Left Breast; 5 years cancer free(7/17)  . Complication of anesthesia    Pt stated it took her a long time to wake up after having Hysterectomy in the 70s, but has not had any issues since  . Dysrhythmia    Atrial Fibrillation  . Hypertension    controlled by medication  . PPD positive 1968    Past Surgical History:  Procedure Laterality Date  . ABDOMINAL HYSTERECTOMY  09/20/75  . APPENDECTOMY    . BACK SURGERY  03/12/10  . BREAST LUMPECTOMY Left   . BREAST SURGERY Left 11/14/10   DCIS carcinoma InSitu   . CARDIAC CATHETERIZATION    . CATARACT EXTRACTION Bilateral   . COLONOSCOPY W/ POLYPECTOMY    . COLPORRHAPHY  02/15/01   Anterior repair   . HARDWARE REMOVAL N/A 09/28/2015   Procedure: REMOVAL OF RIGHT PEDICLE SCREWS AND ROD L3, L4, L5 Wiltse approach;  Surgeon: Jessy Oto, MD;  Location: [place];  Service: Orthopedics;  Laterality: N/A;  . ROTATOR CUFF REPAIR Left 06/15/08    There were no vitals filed for this visit.      Subjective Assessment - 03/31/17 1124    Subjective Patient reports having increased pain down leg today; She reports still having knee pain but  states that it hasn't been as bad. She reports having increased leg pain with prolonged walking. She reports that she will be going back to the doctor to address back pain and LE radiating pain;    Pertinent History 83 yo Female presents to therapy for bilateral knee osteoarthritis. Pt reports she used to run and had to stop because of knee pain a while ago; Pt has arthritis in knees, back and hands; Pt had back surgery one year ago in April to remove the hardware in low back; the screws had loosened up and was aggravating a nerve; Pt symptoms resolved completely immediately after surgery for 3 months; back pain started coming back July 2017 and the knee arthritis had also further developed; Pt had gotten better in february after therapy for the knees; but the pain and stiffness has increased to where she couldnt get out of bed; takes tramadol 2x day but tries to limit that; Pt has not been able to keep up with HEP from previous therapy and not feeling well; Pt reported a fall about 1 month ago;   Limitations Standing;Walking;Sitting;Lifting;House hold activities   How long can you sit comfortably? couple hours   How long can you stand comfortably? longer with feet apart about 30 min   How long can you  walk comfortably? 100 ft   Diagnostic tests xray shows arthritis in bilateral knees;    Patient Stated Goals climb stairs with no pain;   Currently in Pain? Yes   Pain Score 5    Pain Location Leg   Pain Orientation Left   Pain Descriptors / Indicators Sharp;Shooting   Pain Type Acute pain   Pain Radiating Towards radiating down LE to foot;    Pain Onset 1 to 4 weeks ago   Pain Frequency Intermittent   Aggravating Factors  unpredictable; unsure what makes it worse;    Pain Relieving Factors rest   Effect of Pain on Daily Activities decreased activity tolerance;             [facility] PT Assessment - 03/31/17 0001      Observation/Other Assessments   Lower Extremity Functional Scale  47/80 (the  lower the score the greater the disability, slightly improved from 03/02/17 which was 45/80)     Strength   Right Hip Flexion 4+/5   Right Hip ABduction 4+/5   Right Hip ADduction 4+/5   Left Hip Flexion 4+/5   Left Hip ABduction 4+/5   Left Hip ADduction 4+/5   Right Knee Flexion 4/5   Right Knee Extension 4/5   Left Knee Flexion 4/5   Left Knee Extension 4/5   Right Ankle Dorsiflexion 4/5   Left Ankle Dorsiflexion 4/5     Palpation   Palpation comment patient continues to have severe crepitis with movement and palpation to knees (bilaterally)     Standardized Balance Assessment   Five times sit to stand comments  20.5 se with 2 HHA pushing from chair; (slight increased risk for falls, improved from 01/2017 which was 34 sec)       TREATMENT: Warm up on Nustep BUE/BLE level 2 x4 min (Unbilled);  Patient reports still having pain when negotiating steps. Reports 8/10 after negotiating 5 steps at home, especially when carrying something.  Instructed patient in stair negotiation 4 steps with 6 inch step with 1 rail assist, forward reciprocal, x2 laps with each rail assist; educated patient on importance of leading with opposite UE and LE for better ease and stability. She denies any increase in knee pain when negotiating 6 inch steps at clinic;  PT instructed patient in 5 times sit<>Stand, strength assessment and LEFs to assess goals. See above;  Reinforced HEP: Instructed patient in standing green tband terminal knee extension x10 reps each LE with cues for band placement and positioning to isolate quad muscle activation; Provided written handout for standing tband exercise and quad set exercise for better knee strengthening; Instructed patient that she should be doing land based exercise when she is not in the pool to improve strength and mobility. Also educated patient on importance of doing exercise early in the morning to reduce arthritic pain in knees. Patient verbalized  understanding;                      PT Education - 03/31/17 1126    Education provided Yes   Education Details Progress towards goals/recommendations; HEP reinforced;    Person(s) Educated Patient   Methods Explanation;Demonstration;Verbal cues   Comprehension Verbalized understanding;Returned demonstration;Verbal cues required;Need further instruction             PT Long Term Goals - 03/31/17 1128      PT LONG TERM GOAL #1   Title Pt will be independent with HEP to improve carryover and increase strength,  mobility and gait negotiation;   Baseline Pt has been inconsistent with HEP due to being busy lately.    Time 8   Period Weeks   Status On-going   Target Date 05/26/17     PT LONG TERM GOAL #2   Title Pt will decrease the 5 x sit to stand by 30 seconds in order to decrease risk of fall and improve function;   Baseline 68 sec; (8/15: 34 sec)   Time 8   Period Weeks   Status Achieved   Target Date 05/26/17     PT LONG TERM GOAL #3   Title Pt will negotiate 4, 6" stairs with R rail assist independently with no increase in pain in order to improve ability to get in/out of home;   Baseline no pain when negotiating 4 steps that are 6 inches in height;    Time 8   Period Weeks   Status Achieved   Target Date 05/26/17     PT LONG TERM GOAL #4   Title Pt will increase LE strength bilaterally to 4+/5 in order to improve sit to stand and improve tolerance to functional activities;   Baseline Grossly 4 to 4+/5   Time 8   Period Weeks   Status Partially Met   Target Date 05/26/17     PT LONG TERM GOAL #5   Title Pt will increase LEFS to 70/80 in order to improve function and daily activities;    Baseline --   Time 8   Period Weeks   Status Partially Met   Target Date 05/26/17     PT LONG TERM GOAL #6   Title Patient will report a worst knee pain of 5/10 on VAS in the last 24 hours to improve tolerance with ADLs and reduced symptoms with activities.     Baseline Pt reports 8/10 pain in last 24 hours   Time 8   Period Weeks   Status Not Met   Target Date 05/26/17               Plan - 03/31/17 1746    Clinical Impression Statement Patient instructed in outcome measures to assess progress towards goals. She shows significant improvement in step negotiation and transfers, though still has some knee discomfort. Patient also continues to have weakness in BLE. She has been doing a few sessions in the pool which have seemed to help with reducing knee discomfort. Patient would benefit from additional aquatic therapy to improve strength and mobility in a way that would not increase LE discomfort. Patient agreeable.    Rehab Potential Fair   Clinical Impairments Affecting Rehab Potential negative-chronic, lives alone, back pain, age, fall risk, BLE, not keeping up with HEP from spring; positive- motivation to decrease pain; independent   PT Frequency 1x / week   PT Duration 8 weeks   PT Treatment/Interventions ADLs/Self Care Home Management;Gait training;Stair training;Neuromuscular re-education;Energy conservation;Functional mobility training;Therapeutic activities;Therapeutic exercise;Moist Heat;Manual techniques;Passive range of motion;Balance training;Cryotherapy;Aquatic Therapy;Electrical Stimulation;Ultrasound;Patient/family education;Taping;Dry needling   PT Next Visit Plan patella mobs; strengthening; consider filling out LEFs   PT Home Exercise Plan see pt instructions; adjusted HEP given from orthopedic doctor   Consulted and Agree with Plan of Care Patient      Patient will benefit from skilled therapeutic intervention in order to improve the following deficits and impairments:  Pain, Decreased mobility, Decreased activity tolerance, Decreased endurance, Decreased range of motion, Decreased strength, Difficulty walking, Decreased balance, Hypomobility, Impaired sensation, Increased edema  Visit  Diagnosis: Chronic pain of right knee -  Plan: PT plan of care cert/re-cert  Muscle weakness (generalized) - Plan: PT plan of care cert/re-cert  Chronic pain of left knee - Plan: PT plan of care cert/re-cert       G-Codes - 04/20/17 1749    Functional Assessment Tool Used (Outpatient Only) clinical judgement, 5 times sit<>stand, gait ability, LEFs   Functional Limitation Mobility: Walking and moving around   Mobility: Walking and Moving Around Current Status (F0071) At least 20 percent but less than 40 percent impaired, limited or restricted   Mobility: Walking and Moving Around Goal Status [id number]) At least 1 percent but less than 20 percent impaired, limited or restricted      Problem List Patient Active Problem List   Diagnosis Date Noted  . Right lumbar radiculopathy 09/28/2015    Class: Chronic  . Painful orthopaedic hardware ([place]) 09/28/2015    Class: Chronic    Nickie Warwick PT, DPT 04-20-2017, 5:50 PM  [place] MAIN [facility] SERVICES [address] [place], [place], [address] Phone: [phone]   Fax:  [phone]  Name: Samantha Garcia MRN: [id number] Date of Birth: [birth year]

## 2017-04-09 ENCOUNTER — Ambulatory Visit: Payer: PPO

## 2017-04-09 DIAGNOSIS — M25561 Pain in right knee: Secondary | ICD-10-CM | POA: Diagnosis not present

## 2017-04-09 DIAGNOSIS — G8929 Other chronic pain: Secondary | ICD-10-CM

## 2017-04-09 DIAGNOSIS — M25562 Pain in left knee: Secondary | ICD-10-CM

## 2017-04-09 DIAGNOSIS — M6281 Muscle weakness (generalized): Secondary | ICD-10-CM

## 2017-04-09 NOTE — Therapy (Signed)
Pt enters/exits pool via ramp Participates in the following  Ambulation - 6 L fwd/side, slow with arms on kick boards  Supine float with passive trunk stretching x 15 min  Abdominal stabilization at bench, 30x each, slow - DKTC - LAQ - scissor - flutter - elementary back stroke LE, UE practice  Elementary back stroke and flutter kick with upper body support/assist, focus on abdominal stab  Land: Seated abdominal stab and with march, kick Supine abdominal stab with SAQ Scapular retraction

## 2017-04-16 ENCOUNTER — Ambulatory Visit: Payer: PPO

## 2017-04-16 DIAGNOSIS — M6281 Muscle weakness (generalized): Secondary | ICD-10-CM

## 2017-04-16 DIAGNOSIS — M25561 Pain in right knee: Principal | ICD-10-CM

## 2017-04-16 DIAGNOSIS — M25562 Pain in left knee: Secondary | ICD-10-CM

## 2017-04-16 DIAGNOSIS — G8929 Other chronic pain: Secondary | ICD-10-CM

## 2017-04-16 NOTE — Therapy (Signed)
[place] MAIN [facility] SERVICES [address] [place], [place], [address] Phone: [phone]   Fax:  [phone]  Physical Therapy Treatment  Patient Details  Name: Samantha Garcia MRN: [id number] Date of Birth: 1932/05/20 Referring Provider: Basil Dess  Encounter Date: 04/16/2017      PT End of Session - 04/16/17 1349    Visit Number 20   Number of Visits 36   Date for PT Re-Evaluation 05/26/17   Authorization Type gcode 1   Authorization Time Period 10   PT Start Time 1030   PT Stop Time 1140   PT Time Calculation (min) 70 min   Activity Tolerance Patient tolerated treatment well;Patient limited by pain   Behavior During Therapy [facility] for tasks assessed/performed      Past Medical History:  Diagnosis Date  . Arthritis    B Knee  . Cancer ([place])    Left Breast; 5 years cancer free(7/17)  . Complication of anesthesia    Pt stated it took her a long time to wake up after having Hysterectomy in the 70s, but has not had any issues since  . Dysrhythmia    Atrial Fibrillation  . Hypertension    controlled by medication  . PPD positive 1968    Past Surgical History:  Procedure Laterality Date  . ABDOMINAL HYSTERECTOMY  09/20/75  . APPENDECTOMY    . BACK SURGERY  03/12/10  . BREAST LUMPECTOMY Left   . BREAST SURGERY Left 11/14/10   DCIS carcinoma InSitu   . CARDIAC CATHETERIZATION    . CATARACT EXTRACTION Bilateral   . COLONOSCOPY W/ POLYPECTOMY    . COLPORRHAPHY  02/15/01   Anterior repair   . HARDWARE REMOVAL N/A 09/28/2015   Procedure: REMOVAL OF RIGHT PEDICLE SCREWS AND ROD L3, L4, L5 Wiltse approach;  Surgeon: Jessy Oto, MD;  Location: [place];  Service: Orthopedics;  Laterality: N/A;  . ROTATOR CUFF REPAIR Left 06/15/08    There were no vitals filed for this visit.      Subjective Assessment - 04/16/17 1343    Subjective Pt notes feeling fine after last session regarding back/sciatica pain. Pt states pain comes and goes  and feels it is aggravated by emotional stress she is currently experiencing. Today R side back pain across R buttock and down lateral R leg to ankle is 6-7/10. Pt has taken aspirin today.    Pertinent History 83 yo Female presents to therapy for bilateral knee osteoarthritis. Pt reports she used to run and had to stop because of knee pain a while ago; Pt has arthritis in knees, back and hands; Pt had back surgery one year ago in April to remove the hardware in low back; the screws had loosened up and was aggravating a nerve; Pt symptoms resolved completely immediately after surgery for 3 months; back pain started coming back July 2017 and the knee arthritis had also further developed; Pt had gotten better in february after therapy for the knees; but the pain and stiffness has increased to where she couldnt get out of bed; takes tramadol 2x day but tries to limit that; Pt has not been able to keep up with HEP from previous therapy and not feeling well; Pt reported a fall about 1 month ago;     Enters/exits pool via ramp Participates in the following  Ambulation, green dumbbells for UE support to side, good posture, slow for gait corrections - 4 L fwd  - 4 L side  Supine float with passive stretch to B trunk, assist at shoulders and feet, 5 min, noodles  Elementary back stroke, active leg stroke, noodles and assisted support at trunk, 4 L  Core stabilization with LE exercises, slow 30x each - flex/ext - abd/add - minisquats  Bench LE exercise, 5 min each - Bicycle - Flutter  Stretching, 3 x 30 sec each, B - SKTC - piriformis                            PT Education - 04/16/17 1346    Education provided Yes   Education Details Stretches for land for [facility] and piriformis in supine. Continued work on core stabilization. Educated pt, as pt has concerns of needing to be "so aware" of all she does; discussed this being half the battle of making changes and keeping  ourselves safe and healthy at any stage in life, illness and injury. Educated pt on looking at that process as a "glass half full" idea in that she is taking positive steps to maintain her health and well being   Person(s) Educated Patient   Methods Explanation;Demonstration   Comprehension Verbalized understanding;Returned demonstration;Verbal cues required;Tactile cues required             PT Long Term Goals - 04/16/17 1352      PT LONG TERM GOAL #1   Title Pt will be independent with HEP to improve carryover and increase strength, mobility and gait negotiation;   Baseline Pt has been inconsistent with HEP due to being busy lately.    Time 8   Period Weeks   Status On-going     PT LONG TERM GOAL #2   Title Pt will decrease the 5 x sit to stand by 30 seconds in order to decrease risk of fall and improve function;   Baseline 68 sec; (8/15: 34 sec)   Time 8   Period Weeks   Status Achieved     PT LONG TERM GOAL #3   Title Pt will negotiate 4, 6" stairs with R rail assist independently with no increase in pain in order to improve ability to get in/out of home;   Baseline no pain when negotiating 4 steps that are 6 inches in height;    Time 8   Period Weeks   Status Achieved     PT LONG TERM GOAL #4   Title Pt will increase LE strength bilaterally to 4+/5 in order to improve sit to stand and improve tolerance to functional activities;   Baseline Grossly 4 to 4+/5   Time 8   Period Weeks   Status Partially Met     PT LONG TERM GOAL #5   Title Pt will increase LEFS to 70/80 in order to improve function and daily activities;    Time 8   Period Weeks   Status Partially Met     PT LONG TERM GOAL #6   Title Patient will report a worst knee pain of 5/10 on VAS in the last 24 hours to improve tolerance with ADLs and reduced symptoms with activities.    Baseline Pt reports 8/10 pain in last 24 hours   Time 8   Period Weeks   Status Not Met               Plan -  04/16/17 1350    Clinical Impression Statement Pt tolerated session well. Required some increased assist with side stepping this session  to maintain fwd facing hips and neutral rotation of LE at hip for abduction step especially when stabilizing on R. Improved technique with bicycle and good participation and appreciation of stretches.    Clinical Impairments Affecting Rehab Potential negative-chronic, lives alone, back pain, age, fall risk, BLE, not keeping up with HEP from spring; positive- motivation to decrease pain; independent   PT Duration 8 weeks   PT Treatment/Interventions ADLs/Self Care Home Management;Gait training;Stair training;Neuromuscular re-education;Energy conservation;Functional mobility training;Therapeutic activities;Therapeutic exercise;Moist Heat;Manual techniques;Passive range of motion;Balance training;Cryotherapy;Aquatic Therapy;Electrical Stimulation;Ultrasound;Patient/family education;Taping;Dry needling   PT Next Visit Plan patella mobs; strengthening; consider filling out LEFs   PT Home Exercise Plan see pt instructions; adjusted HEP given from orthopedic doctor      Patient will benefit from skilled therapeutic intervention in order to improve the following deficits and impairments:  Pain, Decreased mobility, Decreased activity tolerance, Decreased endurance, Decreased range of motion, Decreased strength, Difficulty walking, Decreased balance, Hypomobility, Impaired sensation, Increased edema  Visit Diagnosis: Chronic pain of right knee  Muscle weakness (generalized)  Chronic pain of left knee     Problem List Patient Active Problem List   Diagnosis Date Noted  . Right lumbar radiculopathy 09/28/2015    Class: Chronic  . Painful orthopaedic hardware ([place]) 09/28/2015    Class: Chronic    Larae Grooms 04/16/2017, 1:54 PM  [place] MAIN [facility] SERVICES [address] [place], [place], [address] Phone: [phone]    Fax:  [phone]  Name: Samantha Garcia MRN: [id number] Date of Birth: 31-Aug-1931

## 2017-04-23 ENCOUNTER — Ambulatory Visit: Payer: PPO | Attending: Specialist

## 2017-04-23 DIAGNOSIS — M25561 Pain in right knee: Secondary | ICD-10-CM | POA: Diagnosis not present

## 2017-04-23 DIAGNOSIS — M6281 Muscle weakness (generalized): Secondary | ICD-10-CM | POA: Diagnosis not present

## 2017-04-23 DIAGNOSIS — G8929 Other chronic pain: Secondary | ICD-10-CM | POA: Diagnosis not present

## 2017-04-23 DIAGNOSIS — M25562 Pain in left knee: Secondary | ICD-10-CM | POA: Insufficient documentation

## 2017-04-23 NOTE — Therapy (Signed)
[place] MAIN [facility] SERVICES [address] [place], [place], [address] Phone: [phone]   Fax:  [phone]  Physical Therapy Treatment  Patient Details  Name: Samantha Garcia MRN: [id number] Date of Birth: [birth year] Referring Provider: Basil Dess  Encounter Date: 04/23/2017      PT End of Session - 04/23/17 1435    PT Start Time 1045   PT Stop Time 1145   PT Time Calculation (min) 60 min      Past Medical History:  Diagnosis Date  . Arthritis    B Knee  . Cancer ([place])    Left Breast; 5 years cancer free(7/17)  . Complication of anesthesia    Pt stated it took her a long time to wake up after having Hysterectomy in the 70s, but has not had any issues since  . Dysrhythmia    Atrial Fibrillation  . Hypertension    controlled by medication  . PPD positive 1968    Past Surgical History:  Procedure Laterality Date  . ABDOMINAL HYSTERECTOMY  09/20/75  . APPENDECTOMY    . BACK SURGERY  03/12/10  . BREAST LUMPECTOMY Left   . BREAST SURGERY Left 11/14/10   DCIS carcinoma InSitu   . CARDIAC CATHETERIZATION    . CATARACT EXTRACTION Bilateral   . COLONOSCOPY W/ POLYPECTOMY    . COLPORRHAPHY  02/15/01   Anterior repair   . HARDWARE REMOVAL N/A 09/28/2015   Procedure: REMOVAL OF RIGHT PEDICLE SCREWS AND ROD L3, L4, L5 [place] approach;  Surgeon: Jessy Oto, MD;  Location: [place];  Service: Orthopedics;  Laterality: N/A;  . ROTATOR CUFF REPAIR Left 06/15/08    There were no vitals filed for this visit.      Subjective Assessment - 04/23/17 1427    Subjective Pt continues to report on/off increasing sciatica pain primarily on R side with back pain throughout LB; pt notes it was so bad, she could not walk when stepping out of bed today. Pt notes she nearly cancelled, but managed to start moving around slowly after some time. Currently pain is 8/10 and notes pain to mid calf.    Pertinent History 83 yo Female presents to therapy for  bilateral knee osteoarthritis. Pt reports she used to run and had to stop because of knee pain a while ago; Pt has arthritis in knees, back and hands; Pt had back surgery one year ago in April to remove the hardware in low back; the screws had loosened up and was aggravating a nerve; Pt symptoms resolved completely immediately after surgery for 3 months; back pain started coming back July 2017 and the knee arthritis had also further developed; Pt had gotten better in february after therapy for the knees; but the pain and stiffness has increased to where she couldnt get out of bed; takes tramadol 2x day but tries to limit that; Pt has not been able to keep up with HEP from previous therapy and not feeling well; Pt reported a fall about 1 month ago;     Ambulation, kick boards, slow, good posture, fluid stride - 6 L fwd - 6 L side  Core stabilization at wall with UE exercises, 20x  - sh flex/ext - sh abd/add - sh horizontal abd/add  Supine float with relaxation techniques and flank stretching  Bench exercise 5 min each, conservative speed, 5 min each - bicycle - scissor - flutter  Stretching, 3 x 30 sec each - Piriformis  - Manual gastroc  PT Education - 04/23/17 1431    Education provided Yes   Education Details Discussed/educated on time appropriate for use of back brace (pt has wrap around lumbar support back brace)             PT Long Term Goals - 04/23/17 1434      PT LONG TERM GOAL #1   Title Pt will be independent with HEP to improve carryover and increase strength, mobility and gait negotiation;   Baseline Pt has been inconsistent with HEP due to being busy lately.    Time 8   Period Weeks   Status On-going     PT LONG TERM GOAL #2   Title Pt will decrease the 5 x sit to stand by 30 seconds in order to decrease risk of fall and improve function;   Baseline 68 sec; (8/15: 34 sec)   Time 8   Period Weeks   Status  Achieved     PT LONG TERM GOAL #3   Title Pt will negotiate 4, 6" stairs with R rail assist independently with no increase in pain in order to improve ability to get in/out of home;   Baseline no pain when negotiating 4 steps that are 6 inches in height;    Time 8   Period Weeks   Status Achieved     PT LONG TERM GOAL #4   Title Pt will increase LE strength bilaterally to 4+/5 in order to improve sit to stand and improve tolerance to functional activities;   Baseline Grossly 4 to 4+/5   Time 8   Period Weeks   Status Partially Met     PT LONG TERM GOAL #5   Title Pt will increase LEFS to 70/80 in order to improve function and daily activities;    Time 8   Period Weeks   Status Partially Met     PT LONG TERM GOAL #6   Title Patient will report a worst knee pain of 5/10 on VAS in the last 24 hours to improve tolerance with ADLs and reduced symptoms with activities.    Baseline Pt reports 8/10 pain in last 24 hours   Time 8   Period Weeks   Status Not Met               Plan - 04/23/17 1433    Clinical Impression Statement Pt tolerated session well with focus on standing core stabilization, walking, relaxation techniques and stretching. No increased pain and notes feeling better post session.    Clinical Impairments Affecting Rehab Potential negative-chronic, lives alone, back pain, age, fall risk, BLE, not keeping up with HEP from spring; positive- motivation to decrease pain; independent   PT Duration 8 weeks   PT Treatment/Interventions ADLs/Self Care Home Management;Gait training;Stair training;Neuromuscular re-education;Energy conservation;Functional mobility training;Therapeutic activities;Therapeutic exercise;Moist Heat;Manual techniques;Passive range of motion;Balance training;Cryotherapy;Aquatic Therapy;Electrical Stimulation;Ultrasound;Patient/family education;Taping;Dry needling   PT Next Visit Plan patella mobs; strengthening; consider filling out LEFs   PT Home  Exercise Plan see pt instructions; adjusted HEP given from orthopedic doctor      Patient will benefit from skilled therapeutic intervention in order to improve the following deficits and impairments:  Pain, Decreased mobility, Decreased activity tolerance, Decreased endurance, Decreased range of motion, Decreased strength, Difficulty walking, Decreased balance, Hypomobility, Impaired sensation, Increased edema  Visit Diagnosis: Chronic pain of right knee  Muscle weakness (generalized)  Chronic pain of left knee     Problem List Patient Active Problem List   Diagnosis Date  Noted  . Right lumbar radiculopathy 09/28/2015    Class: Chronic  . Painful orthopaedic hardware ([place]) 09/28/2015    Class: Chronic    Larae Grooms 04/23/2017, 2:36 PM  [place] MAIN [facility] SERVICES [address] [place], [place], [address] Phone: [phone]   Fax:  [phone]  Name: Samantha Garcia MRN: [id number] Date of Birth: 28-Nov-1931

## 2017-04-30 ENCOUNTER — Ambulatory Visit: Payer: PPO

## 2017-05-07 ENCOUNTER — Ambulatory Visit: Payer: PPO

## 2017-05-07 DIAGNOSIS — G8929 Other chronic pain: Secondary | ICD-10-CM

## 2017-05-07 DIAGNOSIS — M25562 Pain in left knee: Secondary | ICD-10-CM

## 2017-05-07 DIAGNOSIS — M25561 Pain in right knee: Secondary | ICD-10-CM | POA: Diagnosis not present

## 2017-05-07 DIAGNOSIS — M6281 Muscle weakness (generalized): Secondary | ICD-10-CM

## 2017-05-07 NOTE — Therapy (Signed)
[place] MAIN [facility] SERVICES [address] [place], [place], [address] Phone: [phone]   Fax:  [phone]  Physical Therapy Treatment  Patient Details  Name: Samantha Garcia MRN: [id number] Date of Birth: 05-22-1932 Referring Provider: Basil Dess   Encounter Date: 05/07/2017  PT End of Session - 05/07/17 1353    Visit Number  22    Number of Visits  36    Date for PT Re-Evaluation  05/26/17    Authorization Type  gcode 1    Authorization Time Period  10    PT Start Time  1030    PT Stop Time  1100    PT Time Calculation (min)  30 min    Activity Tolerance  Patient tolerated treatment well;Patient limited by pain    Behavior During Therapy  [facility] for tasks assessed/performed       Past Medical History:  Diagnosis Date  . Arthritis    B Knee  . Cancer ([place])    Left Breast; 5 years cancer free(7/17)  . Complication of anesthesia    Pt stated it took her a long time to wake up after having Hysterectomy in the 70s, but has not had any issues since  . Dysrhythmia    Atrial Fibrillation  . Hypertension    controlled by medication  . PPD positive 1968    Past Surgical History:  Procedure Laterality Date  . ABDOMINAL HYSTERECTOMY  09/20/75  . APPENDECTOMY    . BACK SURGERY  03/12/10  . BREAST LUMPECTOMY Left   . BREAST SURGERY Left 11/14/10   DCIS carcinoma InSitu   . CARDIAC CATHETERIZATION    . CATARACT EXTRACTION Bilateral   . COLONOSCOPY W/ POLYPECTOMY    . COLPORRHAPHY  02/15/01   Anterior repair   . HARDWARE REMOVAL N/A 09/28/2015   Procedure: REMOVAL OF RIGHT PEDICLE SCREWS AND ROD L3, L4, L5 Wiltse approach;  Surgeon: Jessy Oto, MD;  Location: [place];  Service: Orthopedics;  Laterality: N/A;  . ROTATOR CUFF REPAIR Left 06/15/08    There were no vitals filed for this visit.  Subjective Assessment - 05/07/17 1348    Subjective  Pt reports having increased trouble off/on with LE and RLE pain. Reports yesterday was so  bad, pt could barely put weight onto RLE, so mainly sat during the day. Pt today in back and RLE to calf is 9.5. Pain is relieved nearly completely in the water    Pertinent History  83 yo Female presents to therapy for bilateral knee osteoarthritis. Pt reports she used to run and had to stop because of knee pain a while ago; Pt has arthritis in knees, back and hands; Pt had back surgery one year ago in April to remove the hardware in low back; the screws had loosened up and was aggravating a nerve; Pt symptoms resolved completely immediately after surgery for 3 months; back pain started coming back July 2017 and the knee arthritis had also further developed; Pt had gotten better in february after therapy for the knees; but the pain and stiffness has increased to where she couldnt get out of bed; takes tramadol 2x day but tries to limit that; Pt has not been able to keep up with HEP from previous therapy and not feeling well; Pt reported a fall about 1 month ago;    Limitations  Standing;Walking;Sitting;Lifting;House hold activities    How long can you sit comfortably?  couple hours    How long  can you stand comfortably?  longer with feet apart about 30 min    How long can you walk comfortably?  100 ft    Diagnostic tests  xray shows arthritis in bilateral knees;     Patient Stated Goals  climb stairs with no pain;    Pain Onset  1 to 4 weeks ago    Pain Onset  More than a month ago      Pt enters/exits pool via ramp Participates in the following  Ambulation with UE support, slow, good posture - 6 L fwd  - 6 L side  Bench activities, 5 min each - Bicycle - Flutter - Scissor  Supine with noodles, 10 min - Float with flank and quad stretches  Stretching at bench, B 3 x 20 sec each - SKTC - hams - piriformis - manual calf   [employer], no charge                        PT Education - 05/07/17 1351    Education provided  Yes    Education Details  Educated again on  use of LB brace that pt has (has not tried yet). Also educated pt on use of a rw for increased walking activities and/or "bad" days for added support to back/legs and also decrease chance of falls.     Person(s) Educated  Patient    Methods  Explanation    Comprehension  Verbalized understanding          PT Long Term Goals - 05/07/17 1357      PT LONG TERM GOAL #1   Title  Pt will be independent with HEP to improve carryover and increase strength, mobility and gait negotiation;    Baseline  Pt has been inconsistent with HEP due to being busy lately.     Time  8    Period  Weeks    Status  On-going      PT LONG TERM GOAL #2   Title  Pt will decrease the 5 x sit to stand by 30 seconds in order to decrease risk of fall and improve function;    Baseline  68 sec; (8/15: 34 sec)    Time  8    Period  Weeks    Status  Achieved      PT LONG TERM GOAL #3   Title  Pt will negotiate 4, 6" stairs with R rail assist independently with no increase in pain in order to improve ability to get in/out of home;    Baseline  no pain when negotiating 4 steps that are 6 inches in height;     Time  8    Period  Weeks    Status  Achieved      PT LONG TERM GOAL #4   Title  Pt will increase LE strength bilaterally to 4+/5 in order to improve sit to stand and improve tolerance to functional activities;    Baseline  Grossly 4 to 4+/5    Time  8    Period  Weeks    Status  Partially Met      PT LONG TERM GOAL #5   Title  Pt will increase LEFS to 70/80 in order to improve function and daily activities;     Time  8    Period  Weeks    Status  Partially Met      PT LONG TERM GOAL #6   Title  Patient will report a worst knee pain of 5/10 on VAS in the last 24 hours to improve tolerance with ADLs and reduced symptoms with activities.     Baseline  Pt reports 8/10 pain in last 24 hours    Time  8    Period  Weeks    Status  Not Met            Plan - 05/07/17 1354    Clinical Impression  Statement  Pt notes relief of back/RLE pain while chest deep in water with all activities. Tolerated some basic strengthening, stretching activities with focus on core and improved, and fluid gait pattern while symptom free. Discussed trialing back brace pt has for added support during household activities and out of house walking, as well as obtaining a rw for improved support with ambulation particularly out of the home for extended walking needs and in the home on particularly bad days.      Clinical Impairments Affecting Rehab Potential  negative-chronic, lives alone, back pain, age, fall risk, BLE, not keeping up with HEP from spring; positive- motivation to decrease pain; independent    PT Duration  8 weeks    PT Treatment/Interventions  ADLs/Self Care Home Management;Gait training;Stair training;Neuromuscular re-education;Energy conservation;Functional mobility training;Therapeutic activities;Therapeutic exercise;Moist Heat;Manual techniques;Passive range of motion;Balance training;Cryotherapy;Aquatic Therapy;Electrical Stimulation;Ultrasound;Patient/family education;Taping;Dry needling    PT Next Visit Plan  patella mobs; strengthening; consider filling out LEFs    PT Home Exercise Plan  see pt instructions; adjusted HEP given from orthopedic doctor       Patient will benefit from skilled therapeutic intervention in order to improve the following deficits and impairments:  Pain, Decreased mobility, Decreased activity tolerance, Decreased endurance, Decreased range of motion, Decreased strength, Difficulty walking, Decreased balance, Hypomobility, Impaired sensation, Increased edema  Visit Diagnosis: Chronic pain of right knee  Muscle weakness (generalized)  Chronic pain of left knee     Problem List Patient Active Problem List   Diagnosis Date Noted  . Right lumbar radiculopathy 09/28/2015    Class: Chronic  . Painful orthopaedic hardware ([place]) 09/28/2015    Class: Chronic     Larae Grooms 05/07/2017, 1:59 PM  [place] MAIN [facility] SERVICES [address] [place], [place], [address] Phone: [phone]   Fax:  [phone]  Name: Samantha Garcia MRN: [id number] Date of Birth: 05-May-1932

## 2017-05-20 ENCOUNTER — Ambulatory Visit: Payer: PPO | Admitting: Physical Therapy

## 2017-05-20 ENCOUNTER — Encounter: Payer: Self-pay | Admitting: Physical Therapy

## 2017-05-20 DIAGNOSIS — M25562 Pain in left knee: Secondary | ICD-10-CM

## 2017-05-20 DIAGNOSIS — M25561 Pain in right knee: Secondary | ICD-10-CM | POA: Diagnosis not present

## 2017-05-20 DIAGNOSIS — M6281 Muscle weakness (generalized): Secondary | ICD-10-CM

## 2017-05-20 DIAGNOSIS — G8929 Other chronic pain: Secondary | ICD-10-CM

## 2017-05-20 NOTE — Therapy (Signed)
[place] MAIN [facility] SERVICES [address] [place], [place], [address] Phone: [phone]   Fax:  [phone]  Physical Therapy Treatment/Progress Note  Patient Details  Name: Samantha Garcia MRN: [id number] Date of Birth: [birth year] Referring Provider: Basil Dess   Encounter Date: 05/20/2017  PT End of Session - 05/20/17 0949    Visit Number  23    Number of Visits  36    Date for PT Re-Evaluation  05/26/17    Authorization Type  gcode 6    Authorization Time Period  10    PT Start Time  0935    PT Stop Time  1015    PT Time Calculation (min)  40 min    Activity Tolerance  Patient tolerated treatment well;Patient limited by pain    Behavior During Therapy  [facility] for tasks assessed/performed       Past Medical History:  Diagnosis Date  . Arthritis    B Knee  . Cancer ([place])    Left Breast; 5 years cancer free(7/17)  . Complication of anesthesia    Pt stated it took her a long time to wake up after having Hysterectomy in the 70s, but has not had any issues since  . Dysrhythmia    Atrial Fibrillation  . Hypertension    controlled by medication  . PPD positive 1968    Past Surgical History:  Procedure Laterality Date  . ABDOMINAL HYSTERECTOMY  09/20/75  . APPENDECTOMY    . BACK SURGERY  03/12/10  . BREAST LUMPECTOMY Left   . BREAST SURGERY Left 11/14/10   DCIS carcinoma InSitu   . CARDIAC CATHETERIZATION    . CATARACT EXTRACTION Bilateral   . COLONOSCOPY W/ POLYPECTOMY    . COLPORRHAPHY  02/15/01   Anterior repair   . HARDWARE REMOVAL N/A 09/28/2015   Procedure: REMOVAL OF RIGHT PEDICLE SCREWS AND ROD L3, L4, L5 Wiltse approach;  Surgeon: Jessy Oto, MD;  Location: [place];  Service: Orthopedics;  Laterality: N/A;  . ROTATOR CUFF REPAIR Left 06/15/08    There were no vitals filed for this visit.  Subjective Assessment - 05/20/17 0944    Subjective  Patient reports not doing well; She reports still having BLE Knee pain and  LBP with sciatica. She reports less pain while in the pool but states that this is short duration as when she gets out of the pool her pain returns. She did get lumbar steroid injections but reports that they did not help; She states that she is supposed to go to [place] to visit family this winter; She reports that she is going to see the orthopedic MD in December and then will travel down;     Pertinent History  83 yo Female presents to therapy for bilateral knee osteoarthritis. Pt reports she used to run and had to stop because of knee pain a while ago; Pt has arthritis in knees, back and hands; Pt had back surgery one year ago in April to remove the hardware in low back; the screws had loosened up and was aggravating a nerve; Pt symptoms resolved completely immediately after surgery for 3 months; back pain started coming back July 2017 and the knee arthritis had also further developed; Pt had gotten better in february after therapy for the knees; but the pain and stiffness has increased to where she couldnt get out of bed; takes tramadol 2x day but tries to limit that; Pt has not been able to keep  up with HEP from previous therapy and not feeling well; Pt reported a fall about 1 month ago;    Limitations  Standing;Walking;Sitting;Lifting;House hold activities    How long can you sit comfortably?  couple hours    How long can you stand comfortably?  longer with feet apart about 30 min    How long can you walk comfortably?  100 ft    Diagnostic tests  xray shows arthritis in bilateral knees;     Patient Stated Goals  climb stairs with no pain;    Currently in Pain?  Yes    Pain Score  4     Pain Location  Knee    Pain Orientation  Left    Pain Descriptors / Indicators  Aching;Sore    Pain Type  Chronic pain    Pain Onset  1 to 4 weeks ago    Pain Frequency  Intermittent    Aggravating Factors   worse with transfers, climbing stairs    Pain Relieving Factors  rest/ after walking for a while;      Effect of Pain on Daily Activities  decreased activity tolerance;     Multiple Pain Sites  Yes    Pain Score  7    Pain Location  Back    Pain Orientation  Right;Lower    Pain Descriptors / Indicators  Sharp;Shooting    Pain Type  Chronic pain    Pain Radiating Towards  radiates down RLE to foot/ankle;     Pain Onset  More than a month ago    Pain Frequency  Intermittent    Aggravating Factors   worse with standing/walking; worse with initial standing after sitting for a while;     Pain Relieving Factors  sitting    Effect of Pain on Daily Activities  decreased activity tolerance;          [facility] PT Assessment - 05/20/17 0001      Observation/Other Assessments   Lower Extremity Functional Scale   30/80 (the lower the score the greater the disability, more impaired from last reassessment on 03/31/17 which was 47/80      Standardized Balance Assessment   Five times sit to stand comments   36 sec with 2 HHA (>15 sec indicates high fall risk, more impaired from 03/31/17 which was 20.5, however improved from initial eval on 01/07/17 which was 68 sec with BUE)    10 Meter Walk  1.17 m/s without AD (community ambulator, improved from 0.83 m/s on 01/07/17)        TREATMENT: Patient warm up on Nustep BUE/BLE level 2 x10 min (unbilled) during history intake;  Instructed patient in 10 meter walk, 5 times sit<>Stand, LEFs etc to address goals; Patient does require min Vcs for correct exercise technique. She had a hard time with sit<>stand requiring cues to come all the way into full standing;   Patient expressed that she has not been doing land exercise as she recently had family at home for Thanksgiving; Reinforced importance of HEP compliance for LE strengthening and mobility; Patient expressed understanding. She does still have handout and tband for compliance;  Patient will be getting 1 additional pool visit and then will be discharged from therapy as she will be going out of town for [place].                    PT Education - 05/20/17 [phone]    Education provided  Yes  Education Details  progress towards goals, recommendations;     Person(s) Educated  Patient    Methods  Explanation;Demonstration;Verbal cues    Comprehension  Verbalized understanding;Returned demonstration;Verbal cues required;Need further instruction          PT Long Term Goals - 05/20/17 0949      PT LONG TERM GOAL #1   Title  Pt will be independent with HEP to improve carryover and increase strength, mobility and gait negotiation;    Baseline  Pt has been inconsistent with HEP due to being busy lately.     Time  8    Period  Weeks    Status  On-going    Target Date  05/26/17      PT LONG TERM GOAL #2   Title  Pt will decrease the 5 x sit to stand by 30 seconds in order to decrease risk of fall and improve function;    Baseline  --    Time  8    Period  Weeks    Status  Achieved    Target Date  05/26/17      PT LONG TERM GOAL #3   Title  Pt will negotiate 4, 6" stairs with R rail assist independently with no increase in pain in order to improve ability to get in/out of home;    Baseline  started having pain in LEs when negotiating steps, particularly RLE from sciatica    Time  8    Period  Weeks    Status  Not Met    Target Date  05/26/17      PT LONG TERM GOAL #4   Title  Pt will increase LE strength bilaterally to 4+/5 in order to improve sit to stand and improve tolerance to functional activities;    Baseline  Grossly 4 to 4+/5    Time  8    Period  Weeks    Status  Partially Met    Target Date  05/26/17      PT LONG TERM GOAL #5   Title  Pt will increase LEFS to 70/80 in order to improve function and daily activities;     Time  8    Period  Weeks    Status  Partially Met    Target Date  05/26/17      PT LONG TERM GOAL #6   Title  Patient will report a worst knee pain of 5/10 on VAS in the last 24 hours to improve tolerance with ADLs and reduced  symptoms with activities.     Baseline  Pt reports 8/10 pain in last 24 hours    Time  8    Period  Weeks    Status  Not Met    Target Date  05/26/17            Plan - 05/20/17 1242    Clinical Impression Statement  Patient continues to have BLE knee pain and chronic back/sciatic pain; Patient has been getting aquatic therapy and states that the pool does help while she is in the water. However as soon as she gets out of the water her pain returns. Patient does exhibit improved gait speed but is a little slower with transfers; She also tested lower on LEFs which is likely related to increased back pain. Patient would benefit from additional skilled PT intervention to improve strength and mobility; Patient will be getting 1 additional visit of aquatic therapy and then will be discharged as she  is going out of town for the holidays. PT reinforced importance of land HEP for continued strengthening and mobility;     Clinical Impairments Affecting Rehab Potential  negative-chronic, lives alone, back pain, age, fall risk, BLE, not keeping up with HEP from spring; positive- motivation to decrease pain; independent    PT Duration  8 weeks    PT Treatment/Interventions  ADLs/Self Care Home Management;Gait training;Stair training;Neuromuscular re-education;Energy conservation;Functional mobility training;Therapeutic activities;Therapeutic exercise;Moist Heat;Manual techniques;Passive range of motion;Balance training;Cryotherapy;Aquatic Therapy;Electrical Stimulation;Ultrasound;Patient/family education;Taping;Dry needling    PT Next Visit Plan  patella mobs; strengthening; consider filling out LEFs    PT Home Exercise Plan  see pt instructions; adjusted HEP given from orthopedic doctor    Consulted and Agree with Plan of Care  Patient       Patient will benefit from skilled therapeutic intervention in order to improve the following deficits and impairments:  Pain, Decreased mobility, Decreased activity  tolerance, Decreased endurance, Decreased range of motion, Decreased strength, Difficulty walking, Decreased balance, Hypomobility, Impaired sensation, Increased edema  Visit Diagnosis: Chronic pain of right knee  Muscle weakness (generalized)  Chronic pain of left knee     Problem List Patient Active Problem List   Diagnosis Date Noted  . Right lumbar radiculopathy 09/28/2015    Class: Chronic  . Painful orthopaedic hardware ([place]) 09/28/2015    Class: Chronic    Ray[place]ound Katich  PT, DPT 05/20/2017, 12:44 PM  [place] MAIN [facility] SERVICES [address] [place], [place], [address] Phone: [phone]   Fax:  [phone]  Name: Samantha Garcia MRN: [id number] Date of Birth: Jul 22, 1931

## 2017-05-21 ENCOUNTER — Ambulatory Visit: Payer: PPO

## 2017-05-21 DIAGNOSIS — G8929 Other chronic pain: Secondary | ICD-10-CM

## 2017-05-21 DIAGNOSIS — M25561 Pain in right knee: Secondary | ICD-10-CM | POA: Diagnosis not present

## 2017-05-21 DIAGNOSIS — M25562 Pain in left knee: Secondary | ICD-10-CM

## 2017-05-21 DIAGNOSIS — M6281 Muscle weakness (generalized): Secondary | ICD-10-CM

## 2017-05-21 NOTE — Therapy (Signed)
[place] MAIN [facility] SERVICES [address] [place], [place], [address] Phone: [phone]   Fax:  [phone]  Physical Therapy Treatment  Patient Details  Name: Samantha Garcia MRN: [id number] Date of Birth: January 20, 1932 Referring Provider: Basil Dess   Encounter Date: 05/21/2017  PT End of Session - 05/21/17 1359    Visit Number  24    Number of Visits  36    Date for PT Re-Evaluation  05/26/17    PT Start Time  1030    PT Stop Time  1120    PT Time Calculation (min)  50 min       Past Medical History:  Diagnosis Date  . Arthritis    B Knee  . Cancer ([place])    Left Breast; 5 years cancer free(7/17)  . Complication of anesthesia    Pt stated it took her a long time to wake up after having Hysterectomy in the 70s, but has not had any issues since  . Dysrhythmia    Atrial Fibrillation  . Hypertension    controlled by medication  . PPD positive 1968    Past Surgical History:  Procedure Laterality Date  . ABDOMINAL HYSTERECTOMY  09/20/75  . APPENDECTOMY    . BACK SURGERY  03/12/10  . BREAST LUMPECTOMY Left   . BREAST SURGERY Left 11/14/10   DCIS carcinoma InSitu   . CARDIAC CATHETERIZATION    . CATARACT EXTRACTION Bilateral   . COLONOSCOPY W/ POLYPECTOMY    . COLPORRHAPHY  02/15/01   Anterior repair   . HARDWARE REMOVAL N/A 09/28/2015   Procedure: REMOVAL OF RIGHT PEDICLE SCREWS AND ROD L3, L4, L5 Wiltse approach;  Surgeon: Jessy Oto, MD;  Location: [place];  Service: Orthopedics;  Laterality: N/A;  . ROTATOR CUFF REPAIR Left 06/15/08    There were no vitals filed for this visit.  Subjective Assessment - 05/21/17 1356    Subjective  Pt reports pain is the same throughout back and R buttock/hip; 7/10. Pt does note she has not had such severe pain in the past week that she cannot bear weight on her RLE.     Pertinent History  83 yo Female presents to therapy for bilateral knee osteoarthritis. Pt reports she used to run and had  to stop because of knee pain a while ago; Pt has arthritis in knees, back and hands; Pt had back surgery one year ago in April to remove the hardware in low back; the screws had loosened up and was aggravating a nerve; Pt symptoms resolved completely immediately after surgery for 3 months; back pain started coming back July 2017 and the knee arthritis had also further developed; Pt had gotten better in february after therapy for the knees; but the pain and stiffness has increased to where she couldnt get out of bed; takes tramadol 2x day but tries to limit that; Pt has not been able to keep up with HEP from previous therapy and not feeling well; Pt reported a fall about 1 month ago;    Limitations  Standing;Walking;Sitting;Lifting;House hold activities    How long can you sit comfortably?  couple hours    How long can you stand comfortably?  longer with feet apart about 30 min    How long can you walk comfortably?  100 ft    Diagnostic tests  xray shows arthritis in bilateral knees;     Patient Stated Goals  climb stairs with no pain;  Pain Onset  1 to 4 weeks ago    Pain Onset  More than a month ago      Enters/exits via ramp Participates in the following with written program provided.   Ambulation - Fwd 6L - Side 4 L  Seated core strength, 3 min each - Bicycle - Flutter - Scissor  Core strength at wall with UE work, 20x each - sh flex/ext - sh abd/add - sh horiz abd/add  Partial step ups, 15x ea leg  Stretching, 3 x 15 sec each - seated hamstrings - SKTC - piriformis.                         PT Education - 05/21/17 1358    Education provided  Yes    Education Details  Review of written HEP aquatic program for pt use in [place]. Educated on partial step ups and finding appropriate work versus overwork with this exercise.           PT Long Term Goals - 05/21/17 1409      PT LONG TERM GOAL #1   Title  Pt will be independent with HEP to improve  carryover and increase strength, mobility and gait negotiation;    Baseline  Pt has been inconsistent with HEP due to being busy lately.     Time  8    Period  Weeks    Status  On-going      PT LONG TERM GOAL #2   Title  Pt will decrease the 5 x sit to stand by 30 seconds in order to decrease risk of fall and improve function;    Time  8    Period  Weeks    Status  Achieved      PT LONG TERM GOAL #3   Title  Pt will negotiate 4, 6" stairs with R rail assist independently with no increase in pain in order to improve ability to get in/out of home;    Baseline  started having pain in LEs when negotiating steps, particularly RLE from sciatica    Time  8    Period  Weeks    Status  Not Met      PT LONG TERM GOAL #4   Title  Pt will increase LE strength bilaterally to 4+/5 in order to improve sit to stand and improve tolerance to functional activities;    Baseline  Grossly 4 to 4+/5    Time  8    Period  Weeks    Status  Partially Met      PT LONG TERM GOAL #5   Title  Pt will increase LEFS to 70/80 in order to improve function and daily activities;     Time  8    Period  Weeks    Status  Partially Met      PT LONG TERM GOAL #6   Title  Patient will report a worst knee pain of 5/10 on VAS in the last 24 hours to improve tolerance with ADLs and reduced symptoms with activities.     Baseline  Pt reports 8/10 pain in last 24 hours    Time  8    Period  Weeks    Status  Not Met            Plan - 05/21/17 1401    Clinical Impression Statement  Pt demonstrates good understanding of aquatic program; does need some continued cueing for techniques for  side step walking to the left and for stretching. Pt provided with written aquatic program with written cues as verbalized throughout session and pictures for reference. Pt also educated in progression/regression and appropriate work level based on pain/symptoms on any given day. Encouraged time in water every day as possible for basic  walking in between exercises driven days.     Clinical Impairments Affecting Rehab Potential  negative-chronic, lives alone, back pain, age, fall risk, BLE, not keeping up with HEP from spring; positive- motivation to decrease pain; independent    PT Duration  8 weeks    PT Treatment/Interventions  ADLs/Self Care Home Management;Gait training;Stair training;Neuromuscular re-education;Energy conservation;Functional mobility training;Therapeutic activities;Therapeutic exercise;Moist Heat;Manual techniques;Passive range of motion;Balance training;Cryotherapy;Aquatic Therapy;Electrical Stimulation;Ultrasound;Patient/family education;Taping;Dry needling    PT Next Visit Plan  patella mobs; strengthening; consider filling out LEFs    PT Home Exercise Plan  see pt instructions; adjusted HEP given from orthopedic doctor    Consulted and Agree with Plan of Care  Patient       Patient will benefit from skilled therapeutic intervention in order to improve the following deficits and impairments:  Pain, Decreased mobility, Decreased activity tolerance, Decreased endurance, Decreased range of motion, Decreased strength, Difficulty walking, Decreased balance, Hypomobility, Impaired sensation, Increased edema  Visit Diagnosis: Chronic pain of right knee  Muscle weakness (generalized)  Chronic pain of left knee     Problem List Patient Active Problem List   Diagnosis Date Noted  . Right lumbar radiculopathy 09/28/2015    Class: Chronic  . Painful orthopaedic hardware ([place]) 09/28/2015    Class: Chronic    Samantha Garcia 05/21/2017, 2:10 PM  [place] [facility] MAIN [facility] SERVICES [address] [place], [place], [address] Phone: [phone]   Fax:  [phone]  Name: Samantha Garcia MRN: [id number] Date of Birth: July 25, 1931

## 2017-05-28 ENCOUNTER — Encounter: Payer: PPO | Admitting: Physical Therapy

## 2017-06-02 ENCOUNTER — Encounter: Payer: Self-pay | Admitting: Physical Therapy

## 2017-06-02 DIAGNOSIS — M25562 Pain in left knee: Secondary | ICD-10-CM

## 2017-06-02 DIAGNOSIS — G8929 Other chronic pain: Secondary | ICD-10-CM

## 2017-06-02 DIAGNOSIS — M25561 Pain in right knee: Principal | ICD-10-CM

## 2017-06-02 DIAGNOSIS — M6281 Muscle weakness (generalized): Secondary | ICD-10-CM

## 2017-06-02 NOTE — Therapy (Signed)
[place] MAIN [facility] SERVICES [address] [place], [place], [address] Phone: [phone]   Fax:  [phone]  June 02, 2017   '@[email or web address]'$ @  Physical Therapy Discharge Summary  Patient: Samantha Garcia  MRN: [id number]  Date of Birth: [birth year]   Diagnosis: Chronic pain of right knee  Muscle weakness (generalized)  Chronic pain of left knee Referring Provider: Basil Dess   The above patient had been seen in Physical Therapy 24 times of 36 treatments scheduled from 01/07/27-05/21/17  The treatment consisted of exercise, balance training, gait training, aquatic therapy;  The patient is: Improved  Subjective: Patient reports continued low back/hip pain; She reports, "I think that my knees are better but I wonder if that is because my back has been so sore." She reports mixed compliance with HEP due to busy holiday schedule;   Discharge Findings: see therapy note dated 05/20/17 for most recent objective findings;  Functional Status at Discharge: ambulates without AD but does have difficulty with sit<>stand transfers and stair negotiating due to knee pain and weakness;   Goals Partially Met    Sincerely,   Sebastain Fishbaugh, PT, DPT   CC [email or web address]'$ @  [place], [place], [address] Phone: [phone]   Fax:  [phone]  Patient: Samantha Garcia  MRN: [id number]  Date of Birth: [birth year]

## 2017-06-04 DIAGNOSIS — I10 Essential (primary) hypertension: Secondary | ICD-10-CM | POA: Diagnosis not present

## 2017-06-04 DIAGNOSIS — Z8679 Personal history of other diseases of the circulatory system: Secondary | ICD-10-CM | POA: Diagnosis not present

## 2017-06-08 ENCOUNTER — Ambulatory Visit (INDEPENDENT_AMBULATORY_CARE_PROVIDER_SITE_OTHER): Payer: PPO | Admitting: Specialist

## 2017-06-08 ENCOUNTER — Encounter (INDEPENDENT_AMBULATORY_CARE_PROVIDER_SITE_OTHER): Payer: Self-pay | Admitting: Specialist

## 2017-06-08 VITALS — BP 193/90 | HR 66 | Ht 64.5 in | Wt 150.0 lb

## 2017-06-08 DIAGNOSIS — M4316 Spondylolisthesis, lumbar region: Secondary | ICD-10-CM

## 2017-06-08 DIAGNOSIS — R200 Anesthesia of skin: Secondary | ICD-10-CM | POA: Diagnosis not present

## 2017-06-08 DIAGNOSIS — R202 Paresthesia of skin: Secondary | ICD-10-CM

## 2017-06-08 DIAGNOSIS — M48062 Spinal stenosis, lumbar region with neurogenic claudication: Secondary | ICD-10-CM

## 2017-06-08 MED ORDER — TRAMADOL HCL 50 MG PO TABS: 50.0000 mg | ORAL_TABLET | Freq: Two times a day (BID) | ORAL | 0 refills | Status: DC | PRN

## 2017-06-08 NOTE — Patient Instructions (Signed)
Avoid bending, stooping and avoid lifting weights greater than 10 lbs. Avoid prolong standing and walking. Avoid frequent bending and stooping  No lifting greater than 10 lbs. May use ice or moist heat for pain. Weight loss is of benefit. Handicap license is approved.  

## 2017-06-08 NOTE — Progress Notes (Signed)
Office Visit Note   Patient: Samantha Garcia           Date of Birth: 06/04/1932           MRN: [id number] Visit Date: 06/08/2017              Requested by: Carolan Shiver, MD [facility] [place], [place] [address] PCP: Carolan Shiver, MD   Assessment & Plan: Visit Diagnoses: No diagnosis found.  Plan:Avoid bending, stooping and avoid lifting weights greater than 10 lbs. Avoid prolong standing and walking. Avoid frequent bending and stooping  No lifting greater than 10 lbs. May use ice or moist heat for pain. Weight loss is of benefit. Handicap license is approved.  Follow-Up Instructions: No Follow-up on file.   Orders:  No orders of the defined types were placed in this encounter.  No orders of the defined types were placed in this encounter.     Procedures: No procedures performed   Clinical Data: No additional findings.   Subjective: Chief Complaint  Patient presents with  . Middle Back - Follow-up  . Lower Back - Follow-up    83 year old female post removal of the right pedicle screws and rod L3,L4 and L5. She is experiencing right sided sciatica right buttock into the right posterior thigh and posterior calf and not so much at the heel or foot. She takes one tramadol bid and does not want to take any more. Bowel and bladder function are normal. She is planning to go to [place], only sone and daughter in [place] and grandchild. Taking tramadol 50 BID and gabapentin 100 mg qhs. She sleeps okay sometimes there is leg pain and right arm pain. Previous left shoulder pain, now the right shoulder pain pain with lying on there right side. She has numbness in boh arms and is unable to knit.    Review of Systems  Constitutional: Negative.   HENT: Negative.   Eyes: Negative.   Respiratory: Negative.   Cardiovascular: Negative.   Gastrointestinal: Negative.   Endocrine: Negative.   Genitourinary: Negative.     Musculoskeletal: Negative.   Skin: Negative.   Allergic/Immunologic: Negative.   Neurological: Negative.   Hematological: Negative.   Psychiatric/Behavioral: Negative.      Objective: Vital Signs: BP (!) 193/90 (BP Location: Left Arm, Patient Position: Sitting)   Pulse 66   Ht 5' 4.5" (1.638 m)   Wt 150 lb (68 kg)   BMI 25.35 kg/m   Physical Exam  Constitutional: She is oriented to person, place, and time. She appears well-developed and well-nourished.  HENT:  Head: Normocephalic and atraumatic.  Eyes: EOM are normal. Pupils are equal, round, and reactive to light.  Neck: Normal range of motion. Neck supple.  Pulmonary/Chest: Effort normal and breath sounds normal.  Abdominal: Soft. Bowel sounds are normal.  Musculoskeletal: Normal range of motion.  Neurological: She is alert and oriented to person, place, and time.  Skin: Skin is warm and dry.  Psychiatric: She has a normal mood and affect. Her behavior is normal. Judgment and thought content normal.    Back Exam   Tenderness  The patient is experiencing tenderness in the lumbar.  Range of Motion  Extension: normal  Flexion: normal  Lateral bend right: normal  Lateral bend left: normal  Rotation right: normal  Rotation left: normal   Muscle Strength  Right Quadriceps:  5/5  Left Quadriceps:  5/5  Right Hamstrings:  5/5  Left Hamstrings:  5/5   Tests  Straight leg raise right: negative Straight leg raise left: negative  Reflexes  Patellar: normal Achilles: normal Babinski's sign: normal   Other  Toe walk: normal Heel walk: normal Sensation: normal Gait: normal  Erythema: no back redness Scars: absent  Comments:  Bilateral hand finder tip numbness.      Specialty Comments:  No specialty comments available.  Imaging: No results found.   PMFS History: Patient Active Problem List   Diagnosis Date Noted  . Right lumbar radiculopathy 09/28/2015    Priority: High    Class: Chronic  .  Painful orthopaedic hardware ([place]) 09/28/2015    Priority: High    Class: Chronic   Past Medical History:  Diagnosis Date  . Arthritis    B Knee  . Cancer ([place])    Left Breast; 5 years cancer free(7/17)  . Complication of anesthesia    Pt stated it took her a long time to wake up after having Hysterectomy in the 70s, but has not had any issues since  . Dysrhythmia    Atrial Fibrillation  . Hypertension    controlled by medication  . PPD positive 1968    No family history on file.  Past Surgical History:  Procedure Laterality Date  . ABDOMINAL HYSTERECTOMY  09/20/75  . APPENDECTOMY    . BACK SURGERY  03/12/10  . BREAST LUMPECTOMY Left   . BREAST SURGERY Left 11/14/10   DCIS carcinoma InSitu   . CARDIAC CATHETERIZATION    . CATARACT EXTRACTION Bilateral   . COLONOSCOPY W/ POLYPECTOMY    . COLPORRHAPHY  02/15/01   Anterior repair   . HARDWARE REMOVAL N/A 09/28/2015   Procedure: REMOVAL OF RIGHT PEDICLE SCREWS AND ROD L3, L4, L5 Wiltse approach;  Surgeon: Jessy Oto, MD;  Location: [facility];  Service: Orthopedics;  Laterality: N/A;  . ROTATOR CUFF REPAIR Left 06/15/08   Social History   Occupational History  . Not on file  Tobacco Use  . Smoking status: Former Smoker    Packs/day: 1.00    Years: 3.00    Pack years: 3.00    Types: Cigarettes    Last attempt to quit: 06/23/1965    Years since quitting: 51.9  . Smokeless tobacco: Never Used  Substance and Sexual Activity  . Alcohol use: No  . Drug use: No  . Sexual activity: Not on file

## 2017-08-05 DIAGNOSIS — H9190 Unspecified hearing loss, unspecified ear: Secondary | ICD-10-CM | POA: Diagnosis not present

## 2017-08-05 DIAGNOSIS — H9209 Otalgia, unspecified ear: Secondary | ICD-10-CM | POA: Diagnosis not present

## 2017-08-05 DIAGNOSIS — H6123 Impacted cerumen, bilateral: Secondary | ICD-10-CM | POA: Diagnosis not present

## 2017-09-22 ENCOUNTER — Other Ambulatory Visit (INDEPENDENT_AMBULATORY_CARE_PROVIDER_SITE_OTHER): Payer: Self-pay | Admitting: Specialist

## 2017-09-22 MED ORDER — TRAMADOL HCL 50 MG PO TABS: 50.0000 mg | ORAL_TABLET | Freq: Two times a day (BID) | ORAL | 0 refills | Status: DC | PRN

## 2017-09-22 NOTE — Telephone Encounter (Signed)
Pt has appt on 09/24/17 pt called and would like to know if she should cancel her appt due to not being able to see Dr.Newton on the 3rd

## 2017-09-22 NOTE — Telephone Encounter (Signed)
I called and we moved her appt to 10/29/17 @ 230 with Dr. Louanne Skye to review EMG/NCS.  She did request a refill on her pain meds of tramadol

## 2017-09-23 ENCOUNTER — Encounter (INDEPENDENT_AMBULATORY_CARE_PROVIDER_SITE_OTHER): Payer: PPO | Admitting: Physical Medicine and Rehabilitation

## 2017-09-23 NOTE — Telephone Encounter (Signed)
I called her rx to [facility]

## 2017-09-24 ENCOUNTER — Ambulatory Visit (INDEPENDENT_AMBULATORY_CARE_PROVIDER_SITE_OTHER): Payer: PPO | Admitting: Specialist

## 2017-10-27 ENCOUNTER — Encounter (INDEPENDENT_AMBULATORY_CARE_PROVIDER_SITE_OTHER): Payer: Self-pay | Admitting: Physical Medicine and Rehabilitation

## 2017-10-27 ENCOUNTER — Ambulatory Visit (INDEPENDENT_AMBULATORY_CARE_PROVIDER_SITE_OTHER): Payer: PPO | Admitting: Physical Medicine and Rehabilitation

## 2017-10-27 DIAGNOSIS — R202 Paresthesia of skin: Secondary | ICD-10-CM

## 2017-10-27 NOTE — Progress Notes (Signed)
 .  Numeric Pain Rating Scale and Functional Assessment Average Pain 2   In the last MONTH (on 0-10 scale) has pain interfered with the following?  1. General activity like being  able to carry out your everyday physical activities such as walking, climbing stairs, carrying groceries, or moving a chair?  Rating(0)   +Driver, -BT, -Dye Allergies.

## 2017-10-27 NOTE — Progress Notes (Signed)
[name] - 84 y.o. female MRN [id number]  Date of birth: 03-19-1932  Office Visit Note: Visit Date: 10/27/2017 PCP: Carolan Shiver, MD Referred by: Carolan Shiver, MD  Subjective: Chief Complaint  Patient presents with  . Right Hand - Numbness, Tingling  . Left Hand - Pain   HPI: [name] is a pleasant 84 year old left-hand-dominant female that comes in today for electrodiagnostic study of both upper limbs requested by Dr. Basil Dess.  She has been having chronic worsening many year history of bilateral hand pain and numbness.  She reports up until recently the numbness and tingling was in both hands but now she has mainly numbness and tingling in the right hand and its global with all fingers being equal.  She now just reports pain in the left hand without the tingling numbness.  She reports her pain level is only a 2 out of 10.  She does report difficulty with buttoning clothes and manipulating small objects.  She does feel weaker in both hands.  She says she is unable to do so anymore and this is been a big problem.  She is noticed this progressing over a long period of time.  She is really had worsening over the last many months however in terms of tingling and numbness.  She has not had prior electrodiagnostic study.   ROS Otherwise per HPI.  Assessment & Plan: Visit Diagnoses:  1. Paresthesia of skin     Plan: No additional findings.  Impression: The above electrodiagnostic study is ABNORMAL and reveals evidence of:  1. A severe Right median nerve entrapment at the wrist (carpal tunnel syndrome) affecting sensory and motor components. The lesion is characterized by sensory and motor demyelination with significant evidence of axonal injury.  Despite appropriate decompressive therapy there is likely to be some residual, permanent nerve damage.  2. A severe Left median nerve entrapment at the wrist (carpal tunnel syndrome) affecting sensory and motor components.   There was no needle EMG evidence of axonal denervation although there was amplitude loss on both motor and sensory nerve conductions.  This portends a better prognostic outcome with decompressive treatment.   There was no significant electrodiagnostic evidence of other focal nerve compression, cervical radiculopathy or brachial plexopathy.  Recommendations: 1.  Follow-up with referring physician. 2.  Continue current management of symptoms. 3.  Suggest surgical evaluation.  Suggest doing the left hand first even though it is the least affected and she is left-handed and this has a better chance of return of function.    Meds & Orders: No orders of the defined types were placed in this encounter.   Orders Placed This Encounter  Procedures  . NCV with EMG (electromyography)    Follow-up: Return for Dr. Louanne Skye.   Procedures: No procedures performed  EMG & NCV Findings: Evaluation of the left median motor and the right median motor nerves showed prolonged distal onset latency (L7.0, R6.3 ms), reduced amplitude (L2.7, R0.4 mV), and decreased conduction velocity (Elbow-Wrist, L42, R48 m/s).  The left median (across palm) sensory nerve showed prolonged distal peak latency (Wrist, 6.5 ms), reduced amplitude (5.8 V), and prolonged distal peak latency (Palm, 4.6 ms).  The right median (across palm) sensory nerve showed no response (Wrist) and no response (Palm).  The right ulnar sensory nerve showed reduced amplitude (13.4 V).  All remaining nerves (as indicated in the following tables) were within normal limits.  Left vs. Right side comparison data for the median  motor nerve indicates abnormal L-R amplitude difference (85.2 %).  The ulnar motor nerve indicates abnormal L-R velocity difference (A Elbow-B Elbow, 22 m/s).  All remaining left vs. right side differences were within normal limits.    All examined muscles (as indicated in the following table) showed no evidence of electrical instability.      Impression: The above electrodiagnostic study is ABNORMAL and reveals evidence of:  1. A severe Right median nerve entrapment at the wrist (carpal tunnel syndrome) affecting sensory and motor components. The lesion is characterized by sensory and motor demyelination with significant evidence of axonal injury.  Despite appropriate decompressive therapy there is likely to be some residual, permanent nerve damage.  2. A severe Left median nerve entrapment at the wrist (carpal tunnel syndrome) affecting sensory and motor components.  There was no needle EMG evidence of axonal denervation although there was amplitude loss on both motor and sensory nerve conductions.  This portends a better prognostic outcome with decompressive treatment.   There was no significant electrodiagnostic evidence of other focal nerve compression, cervical radiculopathy or brachial plexopathy.  Recommendations: 1.  Follow-up with referring physician. 2.  Continue current management of symptoms. 3.  Suggest surgical evaluation.  Suggest doing the left hand first even though it is the least affected and she is left-handed and this has a better chance of return of function.   Nerve Conduction Studies Anti Sensory Summary Table   Stim Site NR Peak (ms) Norm Peak (ms) P-T Amp (V) Norm P-T Amp Site1 Site2 Delta-P (ms) Dist (cm) Vel (m/s) Norm Vel (m/s)  Left Median Acr Palm Anti Sensory (2nd Digit)  33.9C  Wrist    *6.5 <3.6 *5.8 >10 Wrist Palm 1.9 0.0    Palm    *4.6 <2.0 5.0         Right Median Acr Palm Anti Sensory (2nd Digit)  34.2C  Wrist *NR  <3.6  >10 Wrist Palm  0.0    Palm *NR  <2.0          Left Radial Anti Sensory (Base 1st Digit)  33.6C  Wrist    2.1 <3.1 18.2  Wrist Base 1st Digit 2.1 0.0    Right Radial Anti Sensory (Base 1st Digit)  33.2C  Wrist    2.2 <3.1 21.3  Wrist Base 1st Digit 2.2 0.0    Left Ulnar Anti Sensory (5th Digit)  34.2C  Wrist    3.2 <3.7 18.3 >15.0 Wrist 5th Digit 3.2 14.0 44  >38  Right Ulnar Anti Sensory (5th Digit)  33.7C  Wrist    3.1 <3.7 *13.4 >15.0 Wrist 5th Digit 3.1 14.0 45 >38   Motor Summary Table   Stim Site NR Onset (ms) Norm Onset (ms) O-P Amp (mV) Norm O-P Amp Site1 Site2 Delta-0 (ms) Dist (cm) Vel (m/s) Norm Vel (m/s)  Left Median Motor (Abd Poll Brev)  33.9C  Wrist    *7.0 <4.2 *2.7 >5 Elbow Wrist 5.0 21.0 *42 >50  Elbow    12.0  2.4         Right Median Motor (Abd Poll Brev)  33.2C  Wrist    *6.3 <4.2 *0.4 >5 Elbow Wrist 4.3 20.5 *48 >50  Elbow    10.6  0.3         Left Ulnar Motor (Abd Dig Min)  33.9C  Wrist    2.9 <4.2 6.8 >3 B Elbow Wrist 3.0 19.0 63 >53  B Elbow    5.9  6.1  A Elbow B Elbow 1.4 9.0 64 >53  A Elbow    7.3  6.4         Right Ulnar Motor (Abd Dig Min)  33.1C  Wrist    2.7 <4.2 6.4 >3 B Elbow Wrist 3.2 20.0 63 >53  B Elbow    5.9  6.6  A Elbow B Elbow 1.1 9.5 86 >53  A Elbow    7.0  5.9          EMG   Side Muscle Nerve Root Ins Act Fibs Psw Amp Dur Poly Recrt Int Fraser Din Comment  Left Abd Poll Brev Median C8-T1 Nml Nml Nml Nml Nml 0 Nml Nml   Left 1stDorInt Ulnar C8-T1 Nml Nml Nml Nml Nml 0 Nml Nml   Left PronatorTeres Median C6-7 Nml Nml Nml Nml Nml 0 Nml Nml   Left Biceps Musculocut C5-6 Nml Nml Nml Nml Nml 0 Nml Nml   Left Deltoid Axillary C5-6 Nml Nml Nml Nml Nml 0 Nml Nml     Nerve Conduction Studies Anti Sensory Left/Right Comparison   Stim Site L Lat (ms) R Lat (ms) L-R Lat (ms) L Amp (V) R Amp (V) L-R Amp (%) Site1 Site2 L Vel (m/s) R Vel (m/s) L-R Vel (m/s)  Median Acr Palm Anti Sensory (2nd Digit)  33.9C  Wrist *6.5   *5.8   Wrist Palm     Palm *4.6   5.0         Radial Anti Sensory (Base 1st Digit)  33.6C  Wrist 2.1 2.2 0.1 18.2 21.3 14.6 Wrist Base 1st Digit     Ulnar Anti Sensory (5th Digit)  34.2C  Wrist 3.2 3.1 0.1 18.3 *13.4 26.8 Wrist 5th Digit 44 45 1   Motor Left/Right Comparison   Stim Site L Lat (ms) R Lat (ms) L-R Lat (ms) L Amp (mV) R Amp (mV) L-R Amp (%) Site1 Site2 L Vel (m/s) R  Vel (m/s) L-R Vel (m/s)  Median Motor (Abd Poll Brev)  33.9C  Wrist *7.0 *6.3 0.7 *2.7 *0.4 *85.2 Elbow Wrist *42 *48 6  Elbow 12.0 10.6 1.4 2.4 0.3 87.5       Ulnar Motor (Abd Dig Min)  33.9C  Wrist 2.9 2.7 0.2 6.8 6.4 5.9 B Elbow Wrist 63 63 0  B Elbow 5.9 5.9 0.0 6.1 6.6 7.6 A Elbow B Elbow 64 86 *22  A Elbow 7.3 7.0 0.3 6.4 5.9 7.8          Waveforms:                     Clinical History: No specialty comments available.   She reports that she quit smoking about 52 years ago. Her smoking use included cigarettes. She has a 3.00 pack-year smoking history. She has never used smokeless tobacco. No results for input(s): HGBA1C, LABURIC in the last 8760 hours.  Objective:  VS:  HT:    WT:   BMI:     BP:   HR: bpm  TEMP: ( )  RESP:  Physical Exam  Musculoskeletal:  Inspection reveals osteoarthritis of both hands particularly on the left CMC joint and distal joints. there is also atrophy of the right APB and some flattening of the left APB.  No atrophy of the bilateral  FDI or hand intrinsics. There is no swelling, color changes, allodynia or dystrophic changes. There is 5 out of 5 strength in the bilateral wrist extension, finger abduction and long finger flexion.  There is decreased sensation to light touch in the median nerve distribution on the right but not the left.  The left sensation is somewhat normal.  There is a positive Tinel's test at the right wrist.     Ortho Exam Imaging: No results found.  Past Medical/Family/Surgical/Social History: Medications & Allergies reviewed per EMR, new medications updated. Patient Active Problem List   Diagnosis Date Noted  . Right lumbar radiculopathy 09/28/2015    Class: Chronic  . Painful orthopaedic hardware ([place]) 09/28/2015    Class: Chronic   Past Medical History:  Diagnosis Date  . Arthritis    B Knee  . Cancer ([place])    Left Breast; 5 years cancer free(7/17)  . Complication of anesthesia    Pt stated it took her  a long time to wake up after having Hysterectomy in the 70s, but has not had any issues since  . Dysrhythmia    Atrial Fibrillation  . Hypertension    controlled by medication  . PPD positive 1968   History reviewed. No pertinent family history. Past Surgical History:  Procedure Laterality Date  . ABDOMINAL HYSTERECTOMY  09/20/75  . APPENDECTOMY    . BACK SURGERY  03/12/10  . BREAST LUMPECTOMY Left   . BREAST SURGERY Left 11/14/10   DCIS carcinoma InSitu   . CARDIAC CATHETERIZATION    . CATARACT EXTRACTION Bilateral   . COLONOSCOPY W/ POLYPECTOMY    . COLPORRHAPHY  02/15/01   Anterior repair   . HARDWARE REMOVAL N/A 09/28/2015   Procedure: REMOVAL OF RIGHT PEDICLE SCREWS AND ROD L3, L4, L5 Wiltse approach;  Surgeon: Jessy Oto, MD;  Location: [facility];  Service: Orthopedics;  Laterality: N/A;  . ROTATOR CUFF REPAIR Left 06/15/08   Social History   Occupational History  . Not on file  Tobacco Use  . Smoking status: Former Smoker    Packs/day: 1.00    Years: 3.00    Pack years: 3.00    Types: Cigarettes    Last attempt to quit: 06/23/1965    Years since quitting: 52.3  . Smokeless tobacco: Never Used  Substance and Sexual Activity  . Alcohol use: No  . Drug use: No  . Sexual activity: Not on file

## 2017-10-28 NOTE — Procedures (Signed)
EMG & NCV Findings: Evaluation of the left median motor and the right median motor nerves showed prolonged distal onset latency (L7.0, R6.3 ms), reduced amplitude (L2.7, R0.4 mV), and decreased conduction velocity (Elbow-Wrist, L42, R48 m/s).  The left median (across palm) sensory nerve showed prolonged distal peak latency (Wrist, 6.5 ms), reduced amplitude (5.8 V), and prolonged distal peak latency (Palm, 4.6 ms).  The right median (across palm) sensory nerve showed no response (Wrist) and no response (Palm).  The right ulnar sensory nerve showed reduced amplitude (13.4 V).  All remaining nerves (as indicated in the following tables) were within normal limits.  Left vs. Right side comparison data for the median motor nerve indicates abnormal L-R amplitude difference (85.2 %).  The ulnar motor nerve indicates abnormal L-R velocity difference (A Elbow-B Elbow, 22 m/s).  All remaining left vs. right side differences were within normal limits.    All examined muscles (as indicated in the following table) showed no evidence of electrical instability.    Impression: The above electrodiagnostic study is ABNORMAL and reveals evidence of:  1. A severe Right median nerve entrapment at the wrist (carpal tunnel syndrome) affecting sensory and motor components. The lesion is characterized by sensory and motor demyelination with significant evidence of axonal injury.  Despite appropriate decompressive therapy there is likely to be some residual, permanent nerve damage.  2. A severe Left median nerve entrapment at the wrist (carpal tunnel syndrome) affecting sensory and motor components.  There was no needle EMG evidence of axonal denervation although there was amplitude loss on both motor and sensory nerve conductions.  This portends a better prognostic outcome with decompressive treatment.   There was no significant electrodiagnostic evidence of other focal nerve compression, cervical radiculopathy or brachial  plexopathy.  Recommendations: 1.  Follow-up with referring physician. 2.  Continue current management of symptoms. 3.  Suggest surgical evaluation.  Suggest doing the left hand first even though it is the least affected and she is left-handed and this has a better chance of return of function.   Nerve Conduction Studies Anti Sensory Summary Table   Stim Site NR Peak (ms) Norm Peak (ms) P-T Amp (V) Norm P-T Amp Site1 Site2 Delta-P (ms) Dist (cm) Vel (m/s) Norm Vel (m/s)  Left Median Acr Palm Anti Sensory (2nd Digit)  33.9C  Wrist    *6.5 <3.6 *5.8 >10 Wrist Palm 1.9 0.0    Palm    *4.6 <2.0 5.0         Right Median Acr Palm Anti Sensory (2nd Digit)  34.2C  Wrist *NR  <3.6  >10 Wrist Palm  0.0    Palm *NR  <2.0          Left Radial Anti Sensory (Base 1st Digit)  33.6C  Wrist    2.1 <3.1 18.2  Wrist Base 1st Digit 2.1 0.0    Right Radial Anti Sensory (Base 1st Digit)  33.2C  Wrist    2.2 <3.1 21.3  Wrist Base 1st Digit 2.2 0.0    Left Ulnar Anti Sensory (5th Digit)  34.2C  Wrist    3.2 <3.7 18.3 >15.0 Wrist 5th Digit 3.2 14.0 44 >38  Right Ulnar Anti Sensory (5th Digit)  33.7C  Wrist    3.1 <3.7 *13.4 >15.0 Wrist 5th Digit 3.1 14.0 45 >38   Motor Summary Table   Stim Site NR Onset (ms) Norm Onset (ms) O-P Amp (mV) Norm O-P Amp Site1 Site2 Delta-0 (ms) Dist (cm) Vel (m/s) Norm Vel (  m/s)  Left Median Motor (Abd Poll Brev)  33.9C  Wrist    *7.0 <4.2 *2.7 >5 Elbow Wrist 5.0 21.0 *42 >50  Elbow    12.0  2.4         Right Median Motor (Abd Poll Brev)  33.2C  Wrist    *6.3 <4.2 *0.4 >5 Elbow Wrist 4.3 20.5 *48 >50  Elbow    10.6  0.3         Left Ulnar Motor (Abd Dig Min)  33.9C  Wrist    2.9 <4.2 6.8 >3 B Elbow Wrist 3.0 19.0 63 >53  B Elbow    5.9  6.1  A Elbow B Elbow 1.4 9.0 64 >53  A Elbow    7.3  6.4         Right Ulnar Motor (Abd Dig Min)  33.1C  Wrist    2.7 <4.2 6.4 >3 B Elbow Wrist 3.2 20.0 63 >53  B Elbow    5.9  6.6  A Elbow B Elbow 1.1 9.5 86 >53  A Elbow     7.0  5.9          EMG   Side Muscle Nerve Root Ins Act Fibs Psw Amp Dur Poly Recrt Int Fraser Din Comment  Left Abd Poll Brev Median C8-T1 Nml Nml Nml Nml Nml 0 Nml Nml   Left 1stDorInt Ulnar C8-T1 Nml Nml Nml Nml Nml 0 Nml Nml   Left PronatorTeres Median C6-7 Nml Nml Nml Nml Nml 0 Nml Nml   Left Biceps Musculocut C5-6 Nml Nml Nml Nml Nml 0 Nml Nml   Left Deltoid Axillary C5-6 Nml Nml Nml Nml Nml 0 Nml Nml     Nerve Conduction Studies Anti Sensory Left/Right Comparison   Stim Site L Lat (ms) R Lat (ms) L-R Lat (ms) L Amp (V) R Amp (V) L-R Amp (%) Site1 Site2 L Vel (m/s) R Vel (m/s) L-R Vel (m/s)  Median Acr Palm Anti Sensory (2nd Digit)  33.9C  Wrist *6.5   *5.8   Wrist Palm     Palm *4.6   5.0         Radial Anti Sensory (Base 1st Digit)  33.6C  Wrist 2.1 2.2 0.1 18.2 21.3 14.6 Wrist Base 1st Digit     Ulnar Anti Sensory (5th Digit)  34.2C  Wrist 3.2 3.1 0.1 18.3 *13.4 26.8 Wrist 5th Digit 44 45 1   Motor Left/Right Comparison   Stim Site L Lat (ms) R Lat (ms) L-R Lat (ms) L Amp (mV) R Amp (mV) L-R Amp (%) Site1 Site2 L Vel (m/s) R Vel (m/s) L-R Vel (m/s)  Median Motor (Abd Poll Brev)  33.9C  Wrist *7.0 *6.3 0.7 *2.7 *0.4 *85.2 Elbow Wrist *42 *48 6  Elbow 12.0 10.6 1.4 2.4 0.3 87.5       Ulnar Motor (Abd Dig Min)  33.9C  Wrist 2.9 2.7 0.2 6.8 6.4 5.9 B Elbow Wrist 63 63 0  B Elbow 5.9 5.9 0.0 6.1 6.6 7.6 A Elbow B Elbow 64 86 *22  A Elbow 7.3 7.0 0.3 6.4 5.9 7.8          Waveforms:

## 2017-10-29 ENCOUNTER — Encounter (INDEPENDENT_AMBULATORY_CARE_PROVIDER_SITE_OTHER): Payer: Self-pay | Admitting: Specialist

## 2017-10-29 ENCOUNTER — Ambulatory Visit (INDEPENDENT_AMBULATORY_CARE_PROVIDER_SITE_OTHER): Payer: PPO | Admitting: Specialist

## 2017-10-29 VITALS — BP 138/70 | HR 61 | Ht 64.5 in | Wt 150.0 lb

## 2017-10-29 DIAGNOSIS — G5603 Carpal tunnel syndrome, bilateral upper limbs: Secondary | ICD-10-CM | POA: Diagnosis not present

## 2017-10-29 MED ORDER — GABAPENTIN 300 MG PO CAPS: ORAL_CAPSULE | ORAL | 3 refills | Status: DC

## 2017-10-29 MED ORDER — TRAMADOL HCL 50 MG PO TABS: 50.0000 mg | ORAL_TABLET | Freq: Two times a day (BID) | ORAL | 0 refills | Status: DC | PRN

## 2017-10-29 NOTE — Patient Instructions (Addendum)
Carpal Tunnel Syndrome  Carpal tunnel syndrome is a condition that causes pain in your hand and arm. The carpal tunnel is a narrow area located on the palm side of your wrist. Repeated wrist motion or certain diseases may cause swelling within the tunnel. This swelling pinches the main nerve in the wrist (median nerve). What are the causes? This condition may be caused by:  Repeated wrist motions.  Wrist injuries.  Arthritis.  A cyst or tumor in the carpal tunnel.  Fluid buildup during pregnancy. Sometimes the cause of this condition is not known. What increases the risk? This condition is more likely to develop in:  People who have jobs that cause them to repeatedly move their wrists in the same motion, such as Art gallery manager.  Women.  People with certain conditions, such as: ? Diabetes. ? Obesity. ? An underactive thyroid (hypothyroidism). ? Kidney failure. What are the signs or symptoms? Symptoms of this condition include:  A tingling feeling in your fingers, especially in your thumb, index, and middle fingers.  Tingling or numbness in your hand.  An aching feeling in your entire arm, especially when your wrist and elbow are bent for long periods of time.  Wrist pain that goes up your arm to your shoulder.  Pain that goes down into your palm or fingers.  A weak feeling in your hands. You may have trouble grabbing and holding items. Your symptoms may feel worse during the night. How is this diagnosed? This condition is diagnosed with a medical history and physical exam. You may also have tests, including:  An electromyogram (EMG). This test measures electrical signals sent by your nerves into the muscles.  X-rays. How is this treated? Treatment for this condition includes:  Lifestyle changes. It is important to stop doing or modify the activity that caused your condition.  Physical or occupational therapy.  Medicines for pain and inflammation. This may  include medicine that is injected into your wrist.  A wrist splint.  Surgery. Follow these instructions at home: If you have a splint:   Wear it as told by your health care provider. Remove it only as told by your health care provider.  Loosen the splint if your fingers become numb and tingle, or if they turn cold and blue.  Keep the splint clean and dry. General instructions   Take over-the-counter and prescription medicines only as told by your health care provider.  Rest your wrist from any activity that may be causing your pain. If your condition is work related, talk to your employer about changes that can be made, such as getting a wrist pad to use while typing.  If directed, apply ice to the painful area: ? Put ice in a plastic bag. ? Place a towel between your skin and the bag. ? Leave the ice on for 20 minutes, 2-3 times per day.  Keep all follow-up visits as told by your health care provider. This is important.  Do any exercises as told by your health care provider, physical therapist, or occupational therapist. Contact a health care provider if:  You have new symptoms.  Your pain is not controlled with medicines.  Your symptoms get worse. This information is not intended to replace advice given to you by your health care provider. Make sure you discuss any questions you have with your health care provider. Document Released: 06/06/2000 Document Revised: 10/18/2015 Document Reviewed: 02/18/2017 Elsevier Interactive Patient Education  2017 [employer]. Your studies show severe carpal  tunnel findings both wrists with changes that are occurring in both the sensory and muscle controlling part of the nerve. Surgery is indicated to prevent worsening permanent nerve changes. Right release would be done followed by the left side wrist in 3-4 weeks depending on the recovering right side and your schedule.  Risk of infection is 1 in 300. Extremely small risk of blood loss,  Risk of injury to the nerve is 1 in 10,000.  Samantha Garcia is our Financial trader and she will contact you to schedule the surgery.

## 2017-10-29 NOTE — Progress Notes (Signed)
Office Visit Note   Patient: Samantha Garcia           Date of Birth: 16-Oct-1931           MRN: [id number] Visit Date: 10/29/2017              Requested by: Carolan Shiver, MD [facility] [place], [place] [address] PCP: Carolan Shiver, MD   Assessment & Plan: Visit Diagnoses:  1. Carpal tunnel syndrome, bilateral upper limbs     Plan:Carpal Tunnel Syndrome  Carpal tunnel syndrome is a condition that causes pain in your hand and arm. The carpal tunnel is a narrow area located on the palm side of your wrist. Repeated wrist motion or certain diseases may cause swelling within the tunnel. This swelling pinches the main nerve in the wrist (median nerve). What are the causes? This condition may be caused by:  Repeated wrist motions.  Wrist injuries.  Arthritis.  A cyst or tumor in the carpal tunnel.  Fluid buildup during pregnancy. Sometimes the cause of this condition is not known. What increases the risk? This condition is more likely to develop in:  People who have jobs that cause them to repeatedly move their wrists in the same motion, such as Art gallery manager.  Women.  People with certain conditions, such as: ? Diabetes. ? Obesity. ? An underactive thyroid (hypothyroidism). ? Kidney failure. What are the signs or symptoms? Symptoms of this condition include:  A tingling feeling in your fingers, especially in your thumb, index, and middle fingers.  Tingling or numbness in your hand.  An aching feeling in your entire arm, especially when your wrist and elbow are bent for long periods of time.  Wrist pain that goes up your arm to your shoulder.  Pain that goes down into your palm or fingers.  A weak feeling in your hands. You may have trouble grabbing and holding items. Your symptoms may feel worse during the night. How is this diagnosed? This condition is diagnosed with a medical history and physical  exam. You may also have tests, including:  An electromyogram (EMG). This test measures electrical signals sent by your nerves into the muscles.  X-rays. How is this treated? Treatment for this condition includes:  Lifestyle changes. It is important to stop doing or modify the activity that caused your condition.  Physical or occupational therapy.  Medicines for pain and inflammation. This may include medicine that is injected into your wrist.  A wrist splint.  Surgery. Follow these instructions at home: If you have a splint:   Wear it as told by your health care provider. Remove it only as told by your health care provider.  Loosen the splint if your fingers become numb and tingle, or if they turn cold and blue.  Keep the splint clean and dry. General instructions   Take over-the-counter and prescription medicines only as told by your health care provider.  Rest your wrist from any activity that may be causing your pain. If your condition is work related, talk to your employer about changes that can be made, such as getting a wrist pad to use while typing.  If directed, apply ice to the painful area: ? Put ice in a plastic bag. ? Place a towel between your skin and the bag. ? Leave the ice on for 20 minutes, 2-3 times per day.  Keep all follow-up visits as told by your health  care provider. This is important.  Do any exercises as told by your health care provider, physical therapist, or occupational therapist. Contact a health care provider if:  You have new symptoms.  Your pain is not controlled with medicines.  Your symptoms get worse. This information is not intended to replace advice given to you by your health care provider. Make sure you discuss any questions you have with your health care provider. Document Released: 06/06/2000 Document Revised: 10/18/2015 Document Reviewed: 02/18/2017 Elsevier Interactive Patient Education  2017 [employer]. Your studies  show severe carpal tunnel findings both wrists with changes that are occurring in both the sensory and muscle controlling part of the nerve. Surgery is indicated to prevent worsening permanent nerve changes. Right release would be done followed by the left side wrist in 3-4 weeks depending on the recovering right side and your schedule.  Risk of infection is 1 in 300. Extremely small risk of blood loss, Risk of injury to the nerve is 1 in 10,000.  Judeen Hammans Billing is our Financial trader and she will contact you to schedule the surgery.   Follow-Up Instructions: Return in about 1 month (around 11/26/2017).   Orders:  No orders of the defined types were placed in this encounter.  No orders of the defined types were placed in this encounter.     Procedures: No procedures performed   Clinical Data: No additional findings.   Subjective: Chief Complaint  Patient presents with  . Lower Back - Follow-up  . Post NCV    84 year old female left handed with bilateral arm numbness and tingling. Especially into the right hand more than the left. EMG/NCV returned today and show severe.    Review of Systems  Constitutional: Negative.   HENT: Negative.   Eyes: Negative.   Respiratory: Negative.   Cardiovascular: Negative.   Gastrointestinal: Negative.   Endocrine: Negative.   Genitourinary: Negative.   Musculoskeletal: Negative.   Skin: Negative.   Allergic/Immunologic: Negative.   Neurological: Negative.   Hematological: Negative.   Psychiatric/Behavioral: Negative.      Objective: Vital Signs: BP 138/70   Pulse 61   Ht 5' 4.5" (1.638 m)   Wt 150 lb (68 kg)   BMI 25.35 kg/m   Physical Exam  Constitutional: She is oriented to person, place, and time. She appears well-developed and well-nourished.  HENT:  Head: Normocephalic and atraumatic.  Eyes: Pupils are equal, round, and reactive to light. EOM are normal.  Neck: Normal range of motion. Neck supple.    Pulmonary/Chest: Effort normal and breath sounds normal.  Abdominal: Soft. Bowel sounds are normal.  Musculoskeletal: Normal range of motion.  Neurological: She is alert and oriented to person, place, and time.  Skin: Skin is warm and dry.  Psychiatric: She has a normal mood and affect. Her behavior is normal. Judgment and thought content normal.    Right Hand Exam   Tests  Phalen's Sign: negative Tinel's sign (median nerve): negative  Other  Sensation: decreased   Left Hand Exam   Tenderness  The patient is experiencing tenderness in the palmer area.   Tests  Phalen's Sign: negative Tinel's sign (median nerve): negative  Other  Sensation: decreased      Specialty Comments:  No specialty comments available.  Imaging: No results found.   PMFS History: Patient Active Problem List   Diagnosis Date Noted  . Right lumbar radiculopathy 09/28/2015    Priority: High    Class: Chronic  . Painful orthopaedic  hardware ([place]) 09/28/2015    Priority: High    Class: Chronic   Past Medical History:  Diagnosis Date  . Arthritis    B Knee  . Cancer ([place])    Left Breast; 5 years cancer free(7/17)  . Complication of anesthesia    Pt stated it took her a long time to wake up after having Hysterectomy in the 70s, but has not had any issues since  . Dysrhythmia    Atrial Fibrillation  . Hypertension    controlled by medication  . PPD positive 1968    History reviewed. No pertinent family history.  Past Surgical History:  Procedure Laterality Date  . ABDOMINAL HYSTERECTOMY  09/20/75  . APPENDECTOMY    . BACK SURGERY  03/12/10  . BREAST LUMPECTOMY Left   . BREAST SURGERY Left 11/14/10   DCIS carcinoma In[facility]   . CARDIAC CATHETERIZATION    . CATARACT EXTRACTION Bilateral   . COLONOSCOPY W/ POLYPECTOMY    . COLPORRHAPHY  02/15/01   Anterior repair   . HARDWARE REMOVAL N/A 09/28/2015   Procedure: REMOVAL OF RIGHT PEDICLE SCREWS AND ROD L3, L4, L5 [place] approach;   Surgeon: Jessy Oto, MD;  Location: [facility];  Service: Orthopedics;  Laterality: N/A;  . ROTATOR CUFF REPAIR Left 06/15/08   Social History   Occupational History  . Not on file  Tobacco Use  . Smoking status: Former Smoker    Packs/day: 1.00    Years: 3.00    Pack years: 3.00    Types: Cigarettes    Last attempt to quit: 06/23/1965    Years since quitting: 52.3  . Smokeless tobacco: Never Used  Substance and Sexual Activity  . Alcohol use: No  . Drug use: No  . Sexual activity: Not on file

## 2017-11-23 DIAGNOSIS — I10 Essential (primary) hypertension: Secondary | ICD-10-CM | POA: Diagnosis not present

## 2017-11-23 DIAGNOSIS — I480 Paroxysmal atrial fibrillation: Secondary | ICD-10-CM | POA: Diagnosis not present

## 2017-11-23 DIAGNOSIS — Z01818 Encounter for other preprocedural examination: Secondary | ICD-10-CM | POA: Diagnosis not present

## 2017-11-24 ENCOUNTER — Telehealth (INDEPENDENT_AMBULATORY_CARE_PROVIDER_SITE_OTHER): Payer: Self-pay | Admitting: Specialist

## 2017-11-24 NOTE — Telephone Encounter (Signed)
Looks like we are suppose to be doing Carpal Tunnel Release, do you know anything about her message below?

## 2017-11-24 NOTE — Telephone Encounter (Signed)
Patient called stated Dr Louanne Skye was supposed to request and get information from her Cardiologist. Patient went to see Cardiologist and was told no one contacted her office. Stated she received a good report  Dr Chong Sicilian [phone]  Please reach out to MD office because  patient is wanting to have surgery.  Please call to advise patient of status.

## 2017-11-25 NOTE — Telephone Encounter (Signed)
I called patient and left voice mail for return call. °

## 2017-11-27 NOTE — Progress Notes (Signed)
Had Dr. Lissa Hoard review the pts last cards office visit summary from 05/2017 and gave the ok to continue with scheduled carpal tunnel surgery without further cardiology clearance.

## 2017-11-30 ENCOUNTER — Encounter (HOSPITAL_BASED_OUTPATIENT_CLINIC_OR_DEPARTMENT_OTHER): Payer: Self-pay | Admitting: *Deleted

## 2017-12-01 ENCOUNTER — Encounter (HOSPITAL_BASED_OUTPATIENT_CLINIC_OR_DEPARTMENT_OTHER): Payer: Self-pay | Admitting: *Deleted

## 2017-12-01 ENCOUNTER — Other Ambulatory Visit: Payer: Self-pay

## 2017-12-01 NOTE — Progress Notes (Signed)
Patient saw cardiology for clearance on 11-23-17, Dr Chong Sicilian. Patient will stop Xarelto 2d prior to surgery.

## 2017-12-04 ENCOUNTER — Ambulatory Visit (HOSPITAL_BASED_OUTPATIENT_CLINIC_OR_DEPARTMENT_OTHER): Payer: PPO | Admitting: Anesthesiology

## 2017-12-04 ENCOUNTER — Other Ambulatory Visit: Payer: Self-pay

## 2017-12-04 ENCOUNTER — Encounter (HOSPITAL_BASED_OUTPATIENT_CLINIC_OR_DEPARTMENT_OTHER): Payer: Self-pay | Admitting: *Deleted

## 2017-12-04 ENCOUNTER — Encounter (HOSPITAL_BASED_OUTPATIENT_CLINIC_OR_DEPARTMENT_OTHER)
Admission: RE | Disposition: A | Discharge status: Home / Self Care | Payer: Self-pay | Source: Ambulatory Visit | Attending: Specialist

## 2017-12-04 ENCOUNTER — Ambulatory Visit (HOSPITAL_BASED_OUTPATIENT_CLINIC_OR_DEPARTMENT_OTHER)
Admission: RE | Admit: 2017-12-04 | Discharge: 2017-12-04 | Disposition: A | Discharge status: Home / Self Care | Payer: PPO | Source: Ambulatory Visit | Attending: Specialist | Admitting: Specialist

## 2017-12-04 DIAGNOSIS — Z79899 Other long term (current) drug therapy: Secondary | ICD-10-CM | POA: Insufficient documentation

## 2017-12-04 DIAGNOSIS — E785 Hyperlipidemia, unspecified: Secondary | ICD-10-CM | POA: Diagnosis not present

## 2017-12-04 DIAGNOSIS — I4891 Unspecified atrial fibrillation: Secondary | ICD-10-CM | POA: Diagnosis not present

## 2017-12-04 DIAGNOSIS — I10 Essential (primary) hypertension: Secondary | ICD-10-CM | POA: Insufficient documentation

## 2017-12-04 DIAGNOSIS — Z7902 Long term (current) use of antithrombotics/antiplatelets: Secondary | ICD-10-CM | POA: Insufficient documentation

## 2017-12-04 DIAGNOSIS — Z853 Personal history of malignant neoplasm of breast: Secondary | ICD-10-CM | POA: Diagnosis not present

## 2017-12-04 DIAGNOSIS — Z87891 Personal history of nicotine dependence: Secondary | ICD-10-CM | POA: Diagnosis not present

## 2017-12-04 DIAGNOSIS — G5601 Carpal tunnel syndrome, right upper limb: Secondary | ICD-10-CM | POA: Diagnosis not present

## 2017-12-04 DIAGNOSIS — G5603 Carpal tunnel syndrome, bilateral upper limbs: Secondary | ICD-10-CM | POA: Diagnosis present

## 2017-12-04 HISTORY — DX: Hyperlipidemia, unspecified: E78.5

## 2017-12-04 HISTORY — PX: CARPAL TUNNEL RELEASE: SHX101

## 2017-12-04 HISTORY — DX: Unspecified atrial fibrillation: I48.91

## 2017-12-04 SURGERY — CARPAL TUNNEL RELEASE
Anesthesia: Monitor Anesthesia Care | Site: Hand | Laterality: Right

## 2017-12-04 MED ORDER — FENTANYL CITRATE (PF) 100 MCG/2ML IJ SOLN: 25.0000 ug | INTRAMUSCULAR | Status: DC | PRN

## 2017-12-04 MED ORDER — HYDROCODONE-ACETAMINOPHEN 5-325 MG PO TABS: 1.0000 | ORAL_TABLET | ORAL | Status: DC | PRN

## 2017-12-04 MED ORDER — PROMETHAZINE HCL 25 MG/ML IJ SOLN: 6.2500 mg | INTRAMUSCULAR | Status: DC | PRN

## 2017-12-04 MED ORDER — METOCLOPRAMIDE HCL 5 MG/ML IJ SOLN: 5.0000 mg | Freq: Three times a day (TID) | INTRAMUSCULAR | Status: DC | PRN

## 2017-12-04 MED ORDER — BUPIVACAINE HCL (PF) 0.5 % IJ SOLN
INTRAMUSCULAR | Status: DC | PRN
  Administered 2017-12-04: 6.5 mL

## 2017-12-04 MED ORDER — VANCOMYCIN HCL IN DEXTROSE 1-5 GM/200ML-% IV SOLN
INTRAVENOUS | Status: AC
  Filled 2017-12-04: qty 200

## 2017-12-04 MED ORDER — MORPHINE SULFATE (PF) 2 MG/ML IV SOLN: 1.0000 mg | INTRAVENOUS | Status: DC | PRN

## 2017-12-04 MED ORDER — OXYCODONE-ACETAMINOPHEN 5-325 MG PO TABS: 1.0000 | ORAL_TABLET | ORAL | Status: DC | PRN

## 2017-12-04 MED ORDER — ONDANSETRON HCL 4 MG/2ML IJ SOLN
INTRAMUSCULAR | Status: DC | PRN
  Administered 2017-12-04: 4 mg via INTRAVENOUS

## 2017-12-04 MED ORDER — SCOPOLAMINE 1 MG/3DAYS TD PT72: 1.0000 | MEDICATED_PATCH | Freq: Once | TRANSDERMAL | Status: DC | PRN

## 2017-12-04 MED ORDER — VANCOMYCIN HCL IN DEXTROSE 1-5 GM/200ML-% IV SOLN
1000.0000 mg | INTRAVENOUS | Status: AC
  Administered 2017-12-04: 500 mg via INTRAVENOUS

## 2017-12-04 MED ORDER — ONDANSETRON HCL 4 MG/2ML IJ SOLN: 4.0000 mg | Freq: Four times a day (QID) | INTRAMUSCULAR | Status: DC | PRN

## 2017-12-04 MED ORDER — FENTANYL CITRATE (PF) 100 MCG/2ML IJ SOLN
50.0000 ug | INTRAMUSCULAR | Status: DC | PRN
  Administered 2017-12-04: 50 ug via INTRAVENOUS

## 2017-12-04 MED ORDER — LIDOCAINE HCL (PF) 0.5 % IJ SOLN
INTRAMUSCULAR | Status: DC | PRN
  Administered 2017-12-04: 30 mL via INTRAVENOUS

## 2017-12-04 MED ORDER — MIDAZOLAM HCL 2 MG/2ML IJ SOLN: 1.0000 mg | INTRAMUSCULAR | Status: DC | PRN

## 2017-12-04 MED ORDER — PROPOFOL 10 MG/ML IV BOLUS
INTRAVENOUS | Status: DC | PRN
  Administered 2017-12-04 (×2): 20 mg via INTRAVENOUS

## 2017-12-04 MED ORDER — HYDROCODONE-ACETAMINOPHEN 5-325 MG PO TABS: 1.0000 | ORAL_TABLET | ORAL | 0 refills | Status: AC | PRN

## 2017-12-04 MED ORDER — TRAMADOL HCL 50 MG PO TABS: 50.0000 mg | ORAL_TABLET | Freq: Four times a day (QID) | ORAL | Status: DC | PRN

## 2017-12-04 MED ORDER — CHLORHEXIDINE GLUCONATE 4 % EX LIQD: 60.0000 mL | Freq: Once | CUTANEOUS | Status: DC

## 2017-12-04 MED ORDER — ONDANSETRON HCL 4 MG PO TABS: 4.0000 mg | ORAL_TABLET | Freq: Four times a day (QID) | ORAL | Status: DC | PRN

## 2017-12-04 MED ORDER — METOCLOPRAMIDE HCL 5 MG PO TABS: 5.0000 mg | ORAL_TABLET | Freq: Three times a day (TID) | ORAL | Status: DC | PRN

## 2017-12-04 MED ORDER — FENTANYL CITRATE (PF) 100 MCG/2ML IJ SOLN
INTRAMUSCULAR | Status: AC
  Filled 2017-12-04: qty 2

## 2017-12-04 MED ORDER — LACTATED RINGERS IV SOLN: INTRAVENOUS | Status: DC

## 2017-12-04 MED ORDER — LACTATED RINGERS IV SOLN
INTRAVENOUS | Status: DC
  Administered 2017-12-04: 14:00:00 via INTRAVENOUS

## 2017-12-04 SURGICAL SUPPLY — 46 items
BANDAGE ACE 3X5.8 VEL STRL LF (GAUZE/BANDAGES/DRESSINGS) ×2 IMPLANT
BENZOIN TINCTURE PRP APPL 2/3 (GAUZE/BANDAGES/DRESSINGS) IMPLANT
BLADE SURG 15 STRL LF DISP TIS (BLADE) ×1 IMPLANT
BLADE SURG 15 STRL SS (BLADE) ×1
BNDG ESMARK 4X9 LF (GAUZE/BANDAGES/DRESSINGS) IMPLANT
CORD BIPOLAR FORCEPS 12FT (ELECTRODE) ×2 IMPLANT
COVER BACK TABLE 60X90IN (DRAPES) ×2 IMPLANT
COVER MAYO STAND STRL (DRAPES) ×2 IMPLANT
CUFF TOURNIQUET SINGLE 18IN (TOURNIQUET CUFF) ×2 IMPLANT
DERMABOND ADVANCED (GAUZE/BANDAGES/DRESSINGS) ×1
DERMABOND ADVANCED .7 DNX12 (GAUZE/BANDAGES/DRESSINGS) ×1 IMPLANT
DRAPE EXTREMITY T 121X128X90 (DRAPE) ×2 IMPLANT
DRAPE SURG 17X23 STRL (DRAPES) ×2 IMPLANT
DRSG EMULSION OIL 3X3 NADH (GAUZE/BANDAGES/DRESSINGS) ×2 IMPLANT
DRSG PAD ABDOMINAL 8X10 ST (GAUZE/BANDAGES/DRESSINGS) IMPLANT
DURAPREP 26ML APPLICATOR (WOUND CARE) ×2 IMPLANT
ELECT REM PT RETURN 9FT ADLT (ELECTROSURGICAL)
ELECTRODE REM PT RTRN 9FT ADLT (ELECTROSURGICAL) IMPLANT
GAUZE SPONGE 4X4 12PLY STRL (GAUZE/BANDAGES/DRESSINGS) ×2 IMPLANT
GAUZE SPONGE 4X4 12PLY STRL LF (GAUZE/BANDAGES/DRESSINGS) IMPLANT
GLOVE BIOGEL PI IND STRL 8 (GLOVE) ×1 IMPLANT
GLOVE BIOGEL PI IND STRL 9 (GLOVE) ×1 IMPLANT
GLOVE BIOGEL PI INDICATOR 8 (GLOVE) ×1
GLOVE BIOGEL PI INDICATOR 9 (GLOVE) ×1
GLOVE ORTHO TXT STRL SZ7.5 (GLOVE) ×2 IMPLANT
GOWN STRL REUS W/ TWL LRG LVL3 (GOWN DISPOSABLE) ×2 IMPLANT
GOWN STRL REUS W/TWL 2XL LVL3 (GOWN DISPOSABLE) ×2 IMPLANT
GOWN STRL REUS W/TWL LRG LVL3 (GOWN DISPOSABLE) ×2
NEEDLE HYPO 22GX1.5 SAFETY (NEEDLE) ×2 IMPLANT
PACK BASIN DAY SURGERY FS (CUSTOM PROCEDURE TRAY) ×2 IMPLANT
PAD CAST 3X4 CTTN HI CHSV (CAST SUPPLIES) ×1 IMPLANT
PADDING CAST ABS 4INX4YD NS (CAST SUPPLIES)
PADDING CAST ABS COTTON 4X4 ST (CAST SUPPLIES) IMPLANT
PADDING CAST COTTON 3X4 STRL (CAST SUPPLIES) ×1
PENCIL BUTTON HOLSTER BLD 10FT (ELECTRODE) IMPLANT
RUBBERBAND STERILE (MISCELLANEOUS) IMPLANT
SPLINT FIBERGLASS 3X35 (CAST SUPPLIES) ×2 IMPLANT
SPLINT PLASTER CAST XFAST 3X15 (CAST SUPPLIES) IMPLANT
SPLINT PLASTER XTRA FASTSET 3X (CAST SUPPLIES)
STOCKINETTE 4X48 STRL (DRAPES) ×2 IMPLANT
STRIP CLOSURE SKIN 1/2X4 (GAUZE/BANDAGES/DRESSINGS) IMPLANT
SUT ETHILON 4 0 PS 2 18 (SUTURE) ×2 IMPLANT
SUT VIC AB 3-0 FS2 27 (SUTURE) IMPLANT
SYR BULB 3OZ (MISCELLANEOUS) IMPLANT
SYR CONTROL 10ML LL (SYRINGE) ×2 IMPLANT
TOWEL GREEN STERILE FF (TOWEL DISPOSABLE) ×2 IMPLANT

## 2017-12-04 NOTE — Transfer of Care (Signed)
Immediate Anesthesia Transfer of Care Note  Patient: Samantha Garcia  Procedure(s) Performed: RIGHT OPEN CARPAL TUNNEL RELEASE (Right Hand)  Patient Location: PACU  Anesthesia Type:MAC and Bier block  Level of Consciousness: awake, alert  and oriented  Airway & Oxygen Therapy: Patient Spontanous Breathing and Patient connected to face mask oxygen  Post-op Assessment: Report given to RN and Post -op Vital signs reviewed and stable  Post vital signs: Reviewed and stable  Last Vitals:  Vitals Value Taken Time  BP    Temp    Pulse    Resp    SpO2      Last Pain:  Vitals:   12/04/17 1350  TempSrc: Oral         Complications: No apparent anesthesia complications

## 2017-12-04 NOTE — Brief Op Note (Signed)
12/04/2017  3:21 PM  PATIENT:  Samantha Garcia  84 y.o. female  PRE-OPERATIVE DIAGNOSIS:  severe right carpal tunnel syndrome  POST-OPERATIVE DIAGNOSIS:  severe right carpal tunnel syndrome  PROCEDURE:  Procedure(s) with comments: RIGHT OPEN CARPAL TUNNEL RELEASE (Right) - Bier block  SURGEON:  Surgeon(s) and Role:    * Jessy Oto, MD - Primary  PHYSICIAN ASSISTANT: Benjiman Core, PA-C  ANESTHESIA:   local and regional  EBL:  5cc  BLOOD ADMINISTERED:none  DRAINS: none   LOCAL MEDICATIONS USED:  MARCAINE 0.5% Amount: 7 ml  SPECIMEN:  No Specimen  DISPOSITION OF SPECIMEN:  N/A  COUNTS:  YES  TOURNIQUET:   Total Tourniquet Time Documented: Forearm (Right) - 21 minutes Total: Forearm (Right) - 21 minutes   DICTATION: .Viviann Spare Dictation  PLAN OF CARE: Discharge to home after PACU  PATIENT DISPOSITION:  PACU - hemodynamically stable.   Delay start of Pharmacological VTE agent (>24hrs) due to surgical blood loss or risk of bleeding: yes

## 2017-12-04 NOTE — Anesthesia Postprocedure Evaluation (Signed)
Anesthesia Post Note  Patient: Samantha Garcia  Procedure(s) Performed: RIGHT OPEN CARPAL TUNNEL RELEASE (Right Hand)     Patient location during evaluation: PACU Anesthesia Type: MAC and Bier Block Level of consciousness: awake and alert Pain management: pain level controlled Vital Signs Assessment: post-procedure vital signs reviewed and stable Respiratory status: spontaneous breathing, nonlabored ventilation, respiratory function stable and patient connected to nasal cannula oxygen Cardiovascular status: stable and blood pressure returned to baseline Postop Assessment: no apparent nausea or vomiting Anesthetic complications: no    Last Vitals:  Vitals:   12/04/17 1530 12/04/17 1545  BP: (!) 136/57 (!) 145/64  Pulse: (!) 49 82  Resp: 17 14  Temp: 36.4 C   SpO2: 94% 96%    Last Pain:  Vitals:   12/04/17 1545  TempSrc:   PainSc: 0-No pain                 Tiajuana Amass

## 2017-12-04 NOTE — Discharge Instructions (Addendum)
Post Anesthesia Home Care Instructions  Activity: Get plenty of rest for the remainder of the day. A responsible individual must stay with you for 24 hours following the procedure.  For the next 24 hours, DO NOT: -Drive a car -Paediatric nurse -Drink alcoholic beverages -Take any medication unless instructed by your physician -Make any legal decisions or sign important papers.  Meals: Start with liquid foods such as gelatin or soup. Progress to regular foods as tolerated. Avoid greasy, spicy, heavy foods. If nausea and/or vomiting occur, drink only clear liquids until the nausea and/or vomiting subsides. Call your physician if vomiting continues.  Special Instructions/Symptoms: Your throat may feel dry or sore from the anesthesia or the breathing tube placed in your throat during surgery. If this causes discomfort, gargle with warm salt water. The discomfort should disappear within 24 hours.  If you had a scopolamine patch placed behind your ear for the management of post- operative nausea and/or vomiting:  1. The medication in the patch is effective for 72 hours, after which it should be removed.  Wrap patch in a tissue and discard in the trash. Wash hands thoroughly with soap and water. 2. You may remove the patch earlier than 72 hours if you experience unpleasant side effects which may include dry mouth, dizziness or visual disturbances. 3. Avoid touching the patch. Wash your hands with soap and water after contact with the patch.     Hand Exercises Hand exercises can be helpful to almost anyone. These exercises can strengthen the hands, improve flexibility and movement, and increase blood flow to the hands. These results can make work and daily tasks easier. Hand exercises can be especially helpful for people who have joint pain from arthritis or have nerve damage from overuse (carpal tunnel syndrome). These exercises can also help people who have injured a hand. Most of these hand  exercises are fairly gentle stretching routines. You can do them often throughout the day. Still, it is a good idea to ask your health care provider which exercises would be best for you. Warming your hands before exercise may help to reduce stiffness. You can do this with gentle massage or by placing your hands in warm water for 15 minutes. Also, make sure you pay attention to your level of hand pain as you begin an exercise routine. Exercises Knuckle Bend Repeat this exercise 5-10 times with each hand. 1. Stand or sit with your arm, hand, and all five fingers pointed straight up. Make sure your wrist is straight. 2. Gently and slowly bend your fingers down and inward until the tips of your fingers are touching the tops of your palm. 3. Hold this position for a few seconds. 4. Extend your fingers out to their original position, all pointing straight up again.  Finger Fan Repeat this exercise 5-10 times with each hand. 1. Hold your arm and hand out in front of you. Keep your wrist straight. 2. Squeeze your hand into a fist. 3. Hold this position for a few seconds. 4. Edison Simon out, or spread apart, your hand and fingers as much as possible, stretching every joint fully.  Tabletop Repeat this exercise 5-10 times with each hand. 1. Stand or sit with your arm, hand, and all five fingers pointed straight up. Make sure your wrist is straight. 2. Gently and slowly bend your fingers at the knuckles where they meet the hand until your hand is making an upside-down L shape. Your fingers should form a tabletop. 3. Hold this  position for a few seconds. 4. Extend your fingers out to their original position, all pointing straight up again.  Making Os Repeat this exercise 5-10 times with each hand. 1. Stand or sit with your arm, hand, and all five fingers pointed straight up. Make sure your wrist is straight. 2. Make an O shape by touching your pointer finger to your thumb. Hold for a few seconds. Then open  your hand wide. 3. Repeat this motion with each finger on your hand.  Table Spread Repeat this exercise 5-10 times with each hand. 1. Place your hand on a table with your palm facing down. Make sure your wrist is straight. 2. Spread your fingers out as much as possible. Hold this position for a few seconds. 3. Slide your fingers back together again. Hold for a few seconds.  Ball Grip  Repeat this exercise 10-15 times with each hand. 1. Hold a tennis ball or another soft ball in your hand. 2. While slowly increasing pressure, squeeze the ball as hard as possible. 3. Squeeze as hard as you can for 3-5 seconds. 4. Relax and repeat.  Wrist Curls Repeat this exercise 10-15 times with each hand. 1. Sit in a chair that has armrests. 2. Hold a light weight in your hand, such as a dumbbell that weighs 1-3 pounds (0.5-1.4 kg). Ask your health care provider what weight would be best for you. 3. Rest your hand just over the end of the chair arm with your palm facing up. 4. Gently pivot your wrist up and down while holding the weight. Do not twist your wrist from side to side.  Contact a health care provider if:  Your hand pain or discomfort gets much worse when you do an exercise.  Your hand pain or discomfort does not improve within 2 hours after you exercise. If you have any of these problems, stop doing these exercises right away. Do not do them again unless your health care provider says that you can. Get help right away if:  You develop sudden, severe hand pain. If this happens, stop doing these exercises right away. Do not do them again unless your health care provider says that you can. This information is not intended to replace advice given to you by your health care provider. Make sure you discuss any questions you have with your health care provider. Document Released: 05/21/2015 Document Revised: 11/15/2015 Document Reviewed: 12/18/2014 Elsevier Interactive Patient Education  2018  [place] dressing dry. Elevated wrist above heart. Apply ice to palm side of wrist two hours on and one half hour off for 48 hours. May Apply ice at night and go to sleep with out changing. Be sure to keep ice off fingers to prevent frost bite.  Return to office in two days for removal of suture right wrist.

## 2017-12-04 NOTE — H&P (Signed)
PREOPERATIVE H&P  Chief Complaint: severe right carpal tunnel syndrome  HPI: Samantha Garcia is a 84 y.o. female who presents for preoperative history and physical with a diagnosis of severe right carpal tunnel syndrome. Symptoms are rated as moderate to severe, and have been worsening.  This is significantly impairing activities of daily living.  She has elected for surgical management.   Past Medical History:  Diagnosis Date  . Arthritis    B Knee  . Atrial fibrillation ([place])   . Cancer ([place])    Left Breast; 5 years cancer free(7/17)  . Complication of anesthesia    Pt stated it took her a long time to wake up after having Hysterectomy in the 70s, but has not had any issues since  . Dysrhythmia    Atrial Fibrillation  . Hyperlipidemia   . Hypertension    controlled by medication  . PPD positive 1968   Past Surgical History:  Procedure Laterality Date  . ABDOMINAL HYSTERECTOMY  09/20/75  . APPENDECTOMY    . BACK SURGERY  03/12/10  . BREAST LUMPECTOMY Left   . BREAST SURGERY Left 11/14/10   DCIS carcinoma In[facility]   . CARDIAC CATHETERIZATION    . CATARACT EXTRACTION Bilateral   . COLONOSCOPY W/ POLYPECTOMY    . COLPORRHAPHY  02/15/01   Anterior repair   . HARDWARE REMOVAL N/A 09/28/2015   Procedure: REMOVAL OF RIGHT PEDICLE SCREWS AND ROD L3, L4, L5 [place] approach;  Surgeon: Jessy Oto, MD;  Location: [facility];  Service: Orthopedics;  Laterality: N/A;  . ROTATOR CUFF REPAIR Left 06/15/08   Social History   Socioeconomic History  . Marital status: Widowed    Spouse name: Not on file  . Number of children: Not on file  . Years of education: Not on file  . Highest education level: Not on file  Occupational History  . Not on file  Social Needs  . Financial resource strain: Not on file  . Food insecurity:    Worry: Not on file    Inability: Not on file  . Transportation needs:    Medical: Not on file    Non-medical: Not on file  Tobacco Use  . Smoking status: Former Smoker     Packs/day: 1.00    Years: 3.00    Pack years: 3.00    Types: Cigarettes    Last attempt to quit: 06/23/1965    Years since quitting: 52.4  . Smokeless tobacco: Never Used  Substance and Sexual Activity  . Alcohol use: No  . Drug use: No  . Sexual activity: Not on file  Lifestyle  . Physical activity:    Days per week: Not on file    Minutes per session: Not on file  . Stress: Not on file  Relationships  . Social connections:    Talks on phone: Not on file    Gets together: Not on file    Attends religious service: Not on file    Active member of club or organization: Not on file    Attends meetings of clubs or organizations: Not on file    Relationship status: Not on file  Other Topics Concern  . Not on file  Social History Narrative  . Not on file   History reviewed. No pertinent family history. Allergies  Allergen Reactions  . Mushroom Extract Complex Hives  . Statins Other (See Comments)    MUSCLE PAIN  . Amoxicillin-Pot Clavulanate Other (See Comments)    Loose stools  Prior to Admission medications   Medication Sig Start Date End Date Taking? Authorizing Provider  Alpha-Lipoic Acid 200 MG CAPS Take 400 mg by mouth daily. 09/11/10  Yes [provider]  aspirin-acetaminophen-caffeine (EXCEDRIN EXTRA STRENGTH) [phone] MG tablet Take by mouth every 6 (six) hours as needed for headache.   Yes [provider]  B Complex Vitamins (VITAMIN B COMPLEX) TABS Take 1 tablet by mouth daily. 02/03/04  Yes [provider]  chlorthalidone (HYGROTON) 25 MG tablet Take 25 mg by mouth daily. 09/06/15  Yes [provider]  Cholecalciferol (VITAMIN D3) 5000 units CAPS Take by mouth.   Yes [provider]  Coenzyme Q10 100 MG capsule Take 300 mg by mouth daily. 02/03/04  Yes [provider]  diltiazem (CARDIZEM CD) 120 MG 24 hr capsule Take 120 mg by mouth daily. 07/05/15  Yes [provider]  diphenhydramine-acetaminophen  (TYLENOL PM) 25-500 MG TABS tablet Take 1 tablet by mouth at bedtime as needed.   Yes [provider]  gabapentin (NEURONTIN) 300 MG capsule TAKE 2 CAPSULES BY MOUTH AT BEDTIME AND 1 DAILY IN THE MORNING 10/29/17  Yes Jessy Oto, MD  [place] 565 MG CAPS Take 1,130 mg by mouth daily.   Yes [provider]  magnesium oxide (MAG-OX) 400 MG tablet Take 400 mg by mouth daily.   Yes [provider]  Melatonin 3-10 MG TABS Take by mouth.   Yes [provider]  rivaroxaban (XARELTO) 20 MG TABS tablet Take by mouth. 10/02/17  Yes [provider]  traMADol (ULTRAM) 50 MG tablet Take 1 tablet (50 mg total) by mouth every 12 (twelve) hours as needed. for pain 10/29/17  Yes Jessy Oto, MD     Positive ROS: All other systems have been reviewed and were otherwise negative with the exception of those mentioned in the HPI and as above.  Physical Exam: General: Alert, no acute distress Cardiovascular: No pedal edema Respiratory: No cyanosis, no use of accessory musculature GI: No organomegaly, abdomen is soft and non-tender Skin: No lesions in the area of chief complaint Neurologic: Sensation intact distally Psychiatric: Patient is competent for consent with normal mood and affect Lymphatic: No axillary or cervical lymphadenopathy  MUSCULOSKELETAL: Right wrist Tinel's sign positive with pain into the thumb and index. Phalen's sign positive at 5 seconds. EMG/NCV with severe delay in NCV across right wrist with sensory and motor involvement. Left CTS also noted.   Assessment:Bilateral Carpal Tunnel Syndrome. Severe right carpal tunnel syndrome  Plan: Plan for Procedure(s): RIGHT OPEN CARPAL TUNNEL RELEASE  The risks benefits and alternatives were discussed with the patient including but not limited to the risks of nonoperative treatment, versus surgical intervention including infection, bleeding, nerve injury,  blood clots, cardiopulmonary complications,  morbidity, mortality, among others, and they were willing to proceed.   Basil Dess, MD Cell [phone] Office [phone] 12/04/2017 2:18 PM

## 2017-12-04 NOTE — Interval H&P Note (Signed)
History and Physical Interval Note:  12/04/2017 2:20 PM  Samantha Garcia  has presented today for surgery, with the diagnosis of severe right carpal tunnel syndrome  The various methods of treatment have been discussed with the patient and family. After consideration of risks, benefits and other options for treatment, the patient has consented to  Procedure(s): RIGHT OPEN CARPAL TUNNEL RELEASE (Right) as a surgical intervention .  The patient's history has been reviewed, patient examined, no change in status, stable for surgery.  I have reviewed the patient's chart and labs.  Questions were answered to the patient's satisfaction.     Basil Dess

## 2017-12-04 NOTE — Anesthesia Preprocedure Evaluation (Signed)
Anesthesia Evaluation  Patient identified by MRN, date of birth, ID band Patient awake    Reviewed: Allergy & Precautions, NPO status , Patient's Chart, lab work & pertinent test results  Airway Mallampati: II  TM Distance: >3 FB     Dental   Pulmonary former smoker,    breath sounds clear to auscultation       Cardiovascular hypertension, Pt. on medications + dysrhythmias Atrial Fibrillation  Rhythm:Regular Rate:Normal     Neuro/Psych  Neuromuscular disease    GI/Hepatic negative GI ROS, Neg liver ROS,   Endo/Other  negative endocrine ROS  Renal/GU negative Renal ROS     Musculoskeletal  (+) Arthritis ,   Abdominal   Peds  Hematology negative hematology ROS (+)   Anesthesia Other Findings   Reproductive/Obstetrics                             Anesthesia Physical Anesthesia Plan  ASA: III  Anesthesia Plan: MAC and Bier Block and Bier Block-LIDOCAINE ONLY   Post-op Pain Management:    Induction: Intravenous  PONV Risk Score and Plan: 2 and Ondansetron, Propofol infusion and Treatment may vary due to age or medical condition  Airway Management Planned: Natural Airway and Simple Face Mask  Additional Equipment:   Intra-op Plan:   Post-operative Plan:   Informed Consent: I have reviewed the patients History and Physical, chart, labs and discussed the procedure including the risks, benefits and alternatives for the proposed anesthesia with the patient or authorized representative who has indicated his/her understanding and acceptance.     Plan Discussed with: CRNA  Anesthesia Plan Comments:         Anesthesia Quick Evaluation

## 2017-12-04 NOTE — Op Note (Signed)
12/04/2017  3:23 PM  PATIENT:  Samantha Garcia  84 y.o. female  MRN: [id number]  PRE-OPERATIVE DIAGNOSIS:  severe right carpal tunnel syndrome  POST-OPERATIVE DIAGNOSIS:  severe right carpal tunnel syndrome  PROCEDURE:  Procedure(s): RIGHT OPEN CARPAL TUNNEL RELEASE   OPERATIVE REPORT     SURGEON:  Jessy Oto, MD      ASSISTANT:  Benjiman Core, PA-C  (Present throughout the entire procedure and necessary for completion of procedure in a timely manner)      ANESTHESIA:  Regional Bier Block  Right upper forearm  Level, Supplemented with local Marcaine 0.5 % 56LO     COMPLICATIONS:  None.      TOURNIQUET TIME: 21 minutes at  250 mmHg   PROCEDURE: The patient was met in the holding area, and the appropriate right wrist identified and marked with my initials. The patient was then transported to OR and was placed on the operative table in a supine position. The patient was then placed under Bier block anesthesia without difficulty. The patient received appropriate preoperative antibiotic prophylaxis .4gm vancomycin..     The right upper extremity was then prepped using sterile conditions and draped using sterile technique.  Time-out procedure was called and correct. The skin and subcutaneous layers of fascia infiltrated with marcaine 0.5% at the wrist in line with the right 4th digit (ring finger) 7 cc. Using loope magnification and head lamp a 1.5 inch incision curved at the wrist crease with 15 blade scalpel.  Incision through skin and subcutaneous tissue to the volar forearm fascia and  transverse carpal ligament. Fascia then carefully lifed and incised with Stevens scissors inline with the fourth digit. The skin and subcutaneous tissue retracted and the volar fascia divided under direct vision from distal to proximal. A freer elevator then carefully placed between the median nerve and the transverse carpal ligament protecting the  median nerve as the transverse carpal ligament was  divided with a 15 blade scalpel in line with the fourth digit. Retracting the distal skin and subcutaneous tissues distally under direct visualization the remaining portions of the transverse carpal ligament were divided with tenotomy scissors again in line with the fourth digit. The palmar fascia was then divided until the traversing superficial palmar arch was identified and preserved intact.  The motor branch of the median nerve was carefully examined and identified intact. Tourniquet was then released. Bleeding controlled with bipolar electrocautery. The incision was then irrigated with copious amounts of irrigant solution, No active bleeding was present. The incision closed with a single layer skin closure of 4-0 nylon horizontal mattress sutures. Dry dressing of adaptic, 4x4s held in place with sterile webril.  A well padded volar splint applied with ace wrap.  The patient reactivated and returned to the PACU in good condition.  All instruments and sponge counts were Correct. Benjiman Core PA-C assisted with this surgery, he performed retraction of delicate tissue including the skin subcutaneous fat, palmaris longus tendon and performed irrigation and closure of the skin and application of the right wrist plaster splint.           Basil Dess  12/04/2017, 3:23 PM

## 2017-12-04 NOTE — Progress Notes (Signed)
1. Paresthesia of skin [R20.2]     EMG & NCV Findings: Evaluation of the left median motor and the right median motor nerves showed prolonged distal onset latency (L7.0, R6.3 ms), reduced amplitude (L2.7, R0.4 mV), and decreased conduction velocity (Elbow-Wrist, L42, R48 m/s).  The left median (across palm) sensory nerve showed prolonged distal peak latency (Wrist, 6.5 ms), reduced amplitude (5.8 V), and prolonged distal peak latency (Palm, 4.6 ms).  The right median (across palm) sensory nerve showed no response (Wrist) and no response (Palm).  The right ulnar sensory nerve showed reduced amplitude (13.4 V).  All remaining nerves (as indicated in the following tables) were within normal limits.  Left vs. Right side comparison data for the median motor nerve indicates abnormal L-R amplitude difference (85.2 %).  The ulnar motor nerve indicates abnormal L-R velocity difference (A Elbow-B Elbow, 22 m/s).  All remaining left vs. right side differences were within normal limits.    All examined muscles (as indicated in the following table) showed no evidence of electrical instability.    Impression: The above electrodiagnostic study is ABNORMAL and reveals evidence of:  1. A severe Right median nerve entrapment at the wrist (carpal tunnel syndrome) affecting sensory and motor components. The lesion is characterized by sensory and motor demyelination with significant evidence of axonal injury.  Despite appropriate decompressive therapy there is likely to be some residual, permanent nerve damage.  2. A severe Left median nerve entrapment at the wrist (carpal tunnel syndrome) affecting sensory and motor components.  There was no needle EMG evidence of axonal denervation although there was amplitude loss on both motor and sensory nerve conductions.  This portends a better prognostic outcome with decompressive treatment.       Above findings by EMG/NCV by Dr. Ernestina Patches 10/27/2017 see complete office  procedure note.

## 2017-12-07 ENCOUNTER — Encounter (HOSPITAL_BASED_OUTPATIENT_CLINIC_OR_DEPARTMENT_OTHER): Payer: Self-pay | Admitting: Specialist

## 2017-12-17 ENCOUNTER — Ambulatory Visit (INDEPENDENT_AMBULATORY_CARE_PROVIDER_SITE_OTHER): Payer: PPO | Admitting: Surgery

## 2017-12-17 ENCOUNTER — Encounter (INDEPENDENT_AMBULATORY_CARE_PROVIDER_SITE_OTHER): Payer: Self-pay | Admitting: Surgery

## 2017-12-17 VITALS — BP 155/81 | HR 71 | Ht 64.5 in | Wt 144.0 lb

## 2017-12-17 DIAGNOSIS — G5602 Carpal tunnel syndrome, left upper limb: Secondary | ICD-10-CM

## 2017-12-17 DIAGNOSIS — Z9889 Other specified postprocedural states: Secondary | ICD-10-CM

## 2017-12-17 NOTE — Progress Notes (Signed)
84 year old white female who is about 2 weeks status post right carpal tunnel release returns.  Doing well.  Preop hand pain is better.  Still has some residual numbness and tingling.  She has left carpal tunnel syndrome and is waiting to be scheduled for that as well.  Today we also talked about bilateral knee pain secondary to end-stage DJD.  She has seen Dr. Louanne Skye for this in the past and has had conservative treatment with intra-articular cortisone injections, Visco supplementation.  States that she has been looking into doing stem cell treatments.  Exam Today right hand sutures were removed and Steri-Strips applied.  Incision healing well without signs of infection.  No drainage.  Plan Patient will work gentle range of motion of her right hand and wrist.  She has a splint at home that she can use.  We will plan to schedule left carpal tunnel release to be done in 3 weeks.  Advised patient that best treatment option for her end-stage bilateral knee DJD would be total knee replacements.  I told her that I think that she would do very well with this.  She will speak to Dr. Louanne Skye when she returns for recheck.

## 2017-12-22 ENCOUNTER — Telehealth (INDEPENDENT_AMBULATORY_CARE_PROVIDER_SITE_OTHER): Payer: Self-pay | Admitting: Specialist

## 2017-12-22 NOTE — Telephone Encounter (Signed)
I called and spoke with patient she states that when she took the rx for Tramadol to her pharmacy that she was told it was to early to fill it so she kept the rx and when she went to take it she could not find it anywhere in her pocketbook or her house. She states that she has tore her house up looking for the rx.  Can I call it in for her?  Please advise.

## 2017-12-22 NOTE — Telephone Encounter (Signed)
Patient called stated she received a RX for Tramadol 50mg  and has lost the script.  Please call patient to advise. [phone]

## 2017-12-23 ENCOUNTER — Other Ambulatory Visit (INDEPENDENT_AMBULATORY_CARE_PROVIDER_SITE_OTHER): Payer: Self-pay | Admitting: Specialist

## 2017-12-23 MED ORDER — TRAMADOL HCL 50 MG PO TABS: 50.0000 mg | ORAL_TABLET | Freq: Four times a day (QID) | ORAL | 0 refills | Status: DC | PRN

## 2017-12-23 NOTE — Telephone Encounter (Signed)
I called and advised that we called in an Rx for the Tramadol in to [facility]

## 2017-12-23 NOTE — Telephone Encounter (Signed)
I can only prescribe #40 due to state rule concerning amount of medication (narcotics that can be prescribed at one time).  jen Rx printed and signed, and can be called in. jen

## 2017-12-23 NOTE — Telephone Encounter (Signed)
Patient called again checking on prescription being called in to her pharmacy, she said she really needs this. She would like a call back when possible # [phone]

## 2017-12-25 DIAGNOSIS — M810 Age-related osteoporosis without current pathological fracture: Secondary | ICD-10-CM | POA: Diagnosis not present

## 2017-12-25 DIAGNOSIS — C50919 Malignant neoplasm of unspecified site of unspecified female breast: Secondary | ICD-10-CM | POA: Diagnosis not present

## 2017-12-25 DIAGNOSIS — C50612 Malignant neoplasm of axillary tail of left female breast: Secondary | ICD-10-CM | POA: Diagnosis not present

## 2017-12-25 DIAGNOSIS — Z170 Estrogen receptor positive status [ER+]: Secondary | ICD-10-CM | POA: Diagnosis not present

## 2017-12-25 DIAGNOSIS — E785 Hyperlipidemia, unspecified: Secondary | ICD-10-CM | POA: Diagnosis not present

## 2017-12-25 DIAGNOSIS — I4891 Unspecified atrial fibrillation: Secondary | ICD-10-CM | POA: Diagnosis not present

## 2017-12-25 DIAGNOSIS — Z7982 Long term (current) use of aspirin: Secondary | ICD-10-CM | POA: Diagnosis not present

## 2017-12-25 DIAGNOSIS — Z87891 Personal history of nicotine dependence: Secondary | ICD-10-CM | POA: Diagnosis not present

## 2017-12-25 DIAGNOSIS — I10 Essential (primary) hypertension: Secondary | ICD-10-CM | POA: Diagnosis not present

## 2017-12-25 DIAGNOSIS — M170 Bilateral primary osteoarthritis of knee: Secondary | ICD-10-CM | POA: Diagnosis not present

## 2017-12-25 DIAGNOSIS — Z79899 Other long term (current) drug therapy: Secondary | ICD-10-CM | POA: Diagnosis not present

## 2018-01-01 ENCOUNTER — Encounter (HOSPITAL_BASED_OUTPATIENT_CLINIC_OR_DEPARTMENT_OTHER): Payer: Self-pay | Admitting: *Deleted

## 2018-01-01 ENCOUNTER — Other Ambulatory Visit: Payer: Self-pay

## 2018-01-01 NOTE — Progress Notes (Signed)
Patient had right carpal tunnel release done at [facility] on 12-04-17 by Dr Louanne Skye, now scheduled for left. She had cards clearance from Dr Chong Sicilian at that time. She is to hold her Xarelto for 2d prior to surgery.

## 2018-01-05 DIAGNOSIS — Z961 Presence of intraocular lens: Secondary | ICD-10-CM | POA: Diagnosis not present

## 2018-01-08 ENCOUNTER — Ambulatory Visit (HOSPITAL_BASED_OUTPATIENT_CLINIC_OR_DEPARTMENT_OTHER): Payer: PPO | Admitting: Anesthesiology

## 2018-01-08 ENCOUNTER — Ambulatory Visit (HOSPITAL_BASED_OUTPATIENT_CLINIC_OR_DEPARTMENT_OTHER)
Admission: RE | Admit: 2018-01-08 | Discharge: 2018-01-08 | Disposition: A | Discharge status: Home / Self Care | Payer: PPO | Source: Ambulatory Visit | Attending: Specialist | Admitting: Specialist

## 2018-01-08 ENCOUNTER — Other Ambulatory Visit: Payer: Self-pay

## 2018-01-08 ENCOUNTER — Encounter (HOSPITAL_BASED_OUTPATIENT_CLINIC_OR_DEPARTMENT_OTHER): Payer: Self-pay | Admitting: Anesthesiology

## 2018-01-08 ENCOUNTER — Encounter (HOSPITAL_BASED_OUTPATIENT_CLINIC_OR_DEPARTMENT_OTHER)
Admission: RE | Disposition: A | Discharge status: Home / Self Care | Payer: Self-pay | Source: Ambulatory Visit | Attending: Specialist

## 2018-01-08 DIAGNOSIS — I4891 Unspecified atrial fibrillation: Secondary | ICD-10-CM | POA: Diagnosis not present

## 2018-01-08 DIAGNOSIS — I10 Essential (primary) hypertension: Secondary | ICD-10-CM | POA: Diagnosis not present

## 2018-01-08 DIAGNOSIS — Z853 Personal history of malignant neoplasm of breast: Secondary | ICD-10-CM | POA: Insufficient documentation

## 2018-01-08 DIAGNOSIS — Z7901 Long term (current) use of anticoagulants: Secondary | ICD-10-CM | POA: Insufficient documentation

## 2018-01-08 DIAGNOSIS — G5602 Carpal tunnel syndrome, left upper limb: Secondary | ICD-10-CM | POA: Insufficient documentation

## 2018-01-08 DIAGNOSIS — G5603 Carpal tunnel syndrome, bilateral upper limbs: Secondary | ICD-10-CM | POA: Diagnosis present

## 2018-01-08 DIAGNOSIS — Z87891 Personal history of nicotine dependence: Secondary | ICD-10-CM | POA: Insufficient documentation

## 2018-01-08 DIAGNOSIS — Z79899 Other long term (current) drug therapy: Secondary | ICD-10-CM | POA: Insufficient documentation

## 2018-01-08 HISTORY — PX: CARPAL TUNNEL RELEASE: SHX101

## 2018-01-08 LAB — POCT I-STAT, CHEM 8
BUN: 18 mg/dL (ref 8–23)
Calcium, Ion: 1.22 mmol/L (ref 1.15–1.40)
Chloride: 101 mmol/L (ref 98–111)
Creatinine, Ser: 0.8 mg/dL (ref 0.44–1.00)
Glucose, Bld: 107 mg/dL — ABNORMAL HIGH (ref 70–99)
HCT: 39 % (ref 36.0–46.0)
Hemoglobin: 13.3 g/dL (ref 12.0–15.0)
Potassium: 3 mmol/L — ABNORMAL LOW (ref 3.5–5.1)
Sodium: 138 mmol/L (ref 135–145)
TCO2: 25 mmol/L (ref 22–32)

## 2018-01-08 SURGERY — CARPAL TUNNEL RELEASE
Anesthesia: General | Site: Hand | Laterality: Left

## 2018-01-08 MED ORDER — PROPOFOL 10 MG/ML IV BOLUS
INTRAVENOUS | Status: AC
  Filled 2018-01-08: qty 20

## 2018-01-08 MED ORDER — LIDOCAINE 2% (20 MG/ML) 5 ML SYRINGE
INTRAMUSCULAR | Status: DC | PRN
  Administered 2018-01-08: 50 mg via INTRAVENOUS

## 2018-01-08 MED ORDER — BUPIVACAINE HCL (PF) 0.25 % IJ SOLN
INTRAMUSCULAR | Status: AC
  Filled 2018-01-08: qty 30

## 2018-01-08 MED ORDER — BUPIVACAINE HCL (PF) 0.25 % IJ SOLN
INTRAMUSCULAR | Status: DC | PRN
  Administered 2018-01-08: 9 mL

## 2018-01-08 MED ORDER — SCOPOLAMINE 1 MG/3DAYS TD PT72: 1.0000 | MEDICATED_PATCH | Freq: Once | TRANSDERMAL | Status: DC | PRN

## 2018-01-08 MED ORDER — ONDANSETRON HCL 4 MG/2ML IJ SOLN
INTRAMUSCULAR | Status: DC | PRN
  Administered 2018-01-08: 4 mg via INTRAVENOUS

## 2018-01-08 MED ORDER — DEXAMETHASONE SODIUM PHOSPHATE 10 MG/ML IJ SOLN
INTRAMUSCULAR | Status: AC
  Filled 2018-01-08: qty 1

## 2018-01-08 MED ORDER — MORPHINE SULFATE (PF) 2 MG/ML IV SOLN: 1.0000 mg | INTRAVENOUS | Status: DC | PRN

## 2018-01-08 MED ORDER — ONDANSETRON HCL 4 MG/2ML IJ SOLN
INTRAMUSCULAR | Status: AC
  Filled 2018-01-08: qty 2

## 2018-01-08 MED ORDER — HYDROCODONE-ACETAMINOPHEN 5-325 MG PO TABS: 1.0000 | ORAL_TABLET | ORAL | Status: DC | PRN

## 2018-01-08 MED ORDER — METOCLOPRAMIDE HCL 5 MG/ML IJ SOLN: 5.0000 mg | Freq: Three times a day (TID) | INTRAMUSCULAR | Status: DC | PRN

## 2018-01-08 MED ORDER — FENTANYL CITRATE (PF) 100 MCG/2ML IJ SOLN: 25.0000 ug | INTRAMUSCULAR | Status: DC | PRN

## 2018-01-08 MED ORDER — PROPOFOL 10 MG/ML IV BOLUS
INTRAVENOUS | Status: DC | PRN
  Administered 2018-01-08: 140 mg via INTRAVENOUS

## 2018-01-08 MED ORDER — EPHEDRINE SULFATE 50 MG/ML IJ SOLN
INTRAMUSCULAR | Status: AC
Start: 2018-01-08 — End: ?
  Filled 2018-01-08: qty 1

## 2018-01-08 MED ORDER — LACTATED RINGERS IV SOLN: INTRAVENOUS | Status: DC

## 2018-01-08 MED ORDER — CHLORHEXIDINE GLUCONATE 4 % EX LIQD: 60.0000 mL | Freq: Once | CUTANEOUS | Status: DC

## 2018-01-08 MED ORDER — LIDOCAINE HCL (PF) 0.5 % IJ SOLN
INTRAMUSCULAR | Status: AC
  Filled 2018-01-08: qty 50

## 2018-01-08 MED ORDER — FENTANYL CITRATE (PF) 100 MCG/2ML IJ SOLN
INTRAMUSCULAR | Status: AC
  Filled 2018-01-08: qty 2

## 2018-01-08 MED ORDER — LACTATED RINGERS IV SOLN
INTRAVENOUS | Status: DC
  Administered 2018-01-08: 13:00:00 via INTRAVENOUS

## 2018-01-08 MED ORDER — ONDANSETRON HCL 4 MG/2ML IJ SOLN: 4.0000 mg | Freq: Four times a day (QID) | INTRAMUSCULAR | Status: DC | PRN

## 2018-01-08 MED ORDER — OXYCODONE HCL 5 MG/5ML PO SOLN: 5.0000 mg | Freq: Once | ORAL | Status: DC | PRN

## 2018-01-08 MED ORDER — VANCOMYCIN HCL IN DEXTROSE 1-5 GM/200ML-% IV SOLN
INTRAVENOUS | Status: AC
  Filled 2018-01-08: qty 200

## 2018-01-08 MED ORDER — VANCOMYCIN HCL IN DEXTROSE 1-5 GM/200ML-% IV SOLN
1000.0000 mg | INTRAVENOUS | Status: AC
  Administered 2018-01-08: 1000 mg via INTRAVENOUS

## 2018-01-08 MED ORDER — MIDAZOLAM HCL 2 MG/2ML IJ SOLN: 1.0000 mg | INTRAMUSCULAR | Status: DC | PRN

## 2018-01-08 MED ORDER — LIDOCAINE HCL (CARDIAC) PF 100 MG/5ML IV SOSY
PREFILLED_SYRINGE | INTRAVENOUS | Status: AC
  Filled 2018-01-08: qty 5

## 2018-01-08 MED ORDER — DEXAMETHASONE SODIUM PHOSPHATE 4 MG/ML IJ SOLN
INTRAMUSCULAR | Status: DC | PRN
  Administered 2018-01-08: 5 mg via INTRAVENOUS

## 2018-01-08 MED ORDER — OXYCODONE HCL 5 MG PO TABS: 5.0000 mg | ORAL_TABLET | ORAL | Status: DC | PRN

## 2018-01-08 MED ORDER — OXYCODONE HCL 5 MG PO TABS: 5.0000 mg | ORAL_TABLET | Freq: Once | ORAL | Status: DC | PRN

## 2018-01-08 MED ORDER — ONDANSETRON HCL 4 MG PO TABS
4.0000 mg | ORAL_TABLET | Freq: Four times a day (QID) | ORAL | Status: DC | PRN
Start: 2018-01-08 — End: 2018-01-08

## 2018-01-08 MED ORDER — HYDROCODONE-ACETAMINOPHEN 5-325 MG PO TABS: 1.0000 | ORAL_TABLET | ORAL | 0 refills | Status: AC | PRN

## 2018-01-08 MED ORDER — FENTANYL CITRATE (PF) 100 MCG/2ML IJ SOLN
50.0000 ug | INTRAMUSCULAR | Status: DC | PRN
  Administered 2018-01-08: 50 ug via INTRAVENOUS

## 2018-01-08 MED ORDER — METOCLOPRAMIDE HCL 5 MG PO TABS: 5.0000 mg | ORAL_TABLET | Freq: Three times a day (TID) | ORAL | Status: DC | PRN

## 2018-01-08 SURGICAL SUPPLY — 49 items
BANDAGE ACE 3X5.8 VEL STRL LF (GAUZE/BANDAGES/DRESSINGS) ×2 IMPLANT
BENZOIN TINCTURE PRP APPL 2/3 (GAUZE/BANDAGES/DRESSINGS) IMPLANT
BLADE SURG 15 STRL LF DISP TIS (BLADE) ×1 IMPLANT
BLADE SURG 15 STRL SS (BLADE) ×1
BNDG ESMARK 4X9 LF (GAUZE/BANDAGES/DRESSINGS) ×2 IMPLANT
CORD BIPOLAR FORCEPS 12FT (ELECTRODE) ×2 IMPLANT
COVER BACK TABLE 60X90IN (DRAPES) ×2 IMPLANT
COVER MAYO STAND STRL (DRAPES) ×2 IMPLANT
CUFF TOURNIQUET SINGLE 18IN (TOURNIQUET CUFF) ×2 IMPLANT
DERMABOND ADVANCED (GAUZE/BANDAGES/DRESSINGS) ×1
DERMABOND ADVANCED .7 DNX12 (GAUZE/BANDAGES/DRESSINGS) ×1 IMPLANT
DRAPE EXTREMITY T 121X128X90 (DRAPE) ×2 IMPLANT
DRAPE SURG 17X23 STRL (DRAPES) ×2 IMPLANT
DRSG EMULSION OIL 3X3 NADH (GAUZE/BANDAGES/DRESSINGS) ×2 IMPLANT
DRSG PAD ABDOMINAL 8X10 ST (GAUZE/BANDAGES/DRESSINGS) IMPLANT
DURAPREP 26ML APPLICATOR (WOUND CARE) ×2 IMPLANT
ELECT REM PT RETURN 9FT ADLT (ELECTROSURGICAL)
ELECTRODE REM PT RTRN 9FT ADLT (ELECTROSURGICAL) IMPLANT
GAUZE SPONGE 4X4 12PLY STRL (GAUZE/BANDAGES/DRESSINGS) ×2 IMPLANT
GAUZE SPONGE 4X4 12PLY STRL LF (GAUZE/BANDAGES/DRESSINGS) IMPLANT
GLOVE BIOGEL PI IND STRL 8 (GLOVE) ×1 IMPLANT
GLOVE BIOGEL PI IND STRL 9 (GLOVE) ×1 IMPLANT
GLOVE BIOGEL PI INDICATOR 8 (GLOVE) ×1
GLOVE BIOGEL PI INDICATOR 9 (GLOVE) ×1
GLOVE ECLIPSE 7.0 STRL STRAW (GLOVE) ×2 IMPLANT
GLOVE ORTHO TXT STRL SZ7.5 (GLOVE) ×4 IMPLANT
GOWN STRL REUS W/ TWL LRG LVL3 (GOWN DISPOSABLE) ×1 IMPLANT
GOWN STRL REUS W/ TWL XL LVL3 (GOWN DISPOSABLE) ×1 IMPLANT
GOWN STRL REUS W/TWL 2XL LVL3 (GOWN DISPOSABLE) ×2 IMPLANT
GOWN STRL REUS W/TWL LRG LVL3 (GOWN DISPOSABLE) ×1
GOWN STRL REUS W/TWL XL LVL3 (GOWN DISPOSABLE) ×1
NEEDLE HYPO 22GX1.5 SAFETY (NEEDLE) ×2 IMPLANT
PACK BASIN DAY SURGERY FS (CUSTOM PROCEDURE TRAY) ×2 IMPLANT
PAD CAST 3X4 CTTN HI CHSV (CAST SUPPLIES) ×1 IMPLANT
PADDING CAST ABS 4INX4YD NS (CAST SUPPLIES)
PADDING CAST ABS COTTON 4X4 ST (CAST SUPPLIES) IMPLANT
PADDING CAST COTTON 3X4 STRL (CAST SUPPLIES) ×1
PENCIL BUTTON HOLSTER BLD 10FT (ELECTRODE) IMPLANT
RUBBERBAND STERILE (MISCELLANEOUS) IMPLANT
SPLINT FIBERGLASS 3X35 (CAST SUPPLIES) ×2 IMPLANT
SPLINT PLASTER CAST XFAST 3X15 (CAST SUPPLIES) IMPLANT
SPLINT PLASTER XTRA FASTSET 3X (CAST SUPPLIES)
STOCKINETTE 4X48 STRL (DRAPES) ×2 IMPLANT
STRIP CLOSURE SKIN 1/2X4 (GAUZE/BANDAGES/DRESSINGS) IMPLANT
SUT ETHILON 4 0 PS 2 18 (SUTURE) ×2 IMPLANT
SUT VIC AB 3-0 FS2 27 (SUTURE) IMPLANT
SYR BULB 3OZ (MISCELLANEOUS) IMPLANT
SYR CONTROL 10ML LL (SYRINGE) ×2 IMPLANT
TOWEL GREEN STERILE FF (TOWEL DISPOSABLE) ×2 IMPLANT

## 2018-01-08 NOTE — Anesthesia Procedure Notes (Signed)
Procedure Name: LMA Insertion Performed by: Adelise Buswell W, CRNA Pre-anesthesia Checklist: Patient identified, Emergency Drugs available, Suction available and Patient being monitored Patient Re-evaluated:Patient Re-evaluated prior to induction Oxygen Delivery Method: Circle system utilized Preoxygenation: Pre-oxygenation with 100% oxygen Induction Type: IV induction Ventilation: Mask ventilation without difficulty LMA: LMA inserted LMA Size: 4.0 Number of attempts: 1 Placement Confirmation: positive ETCO2 Tube secured with: Tape Dental Injury: Teeth and Oropharynx as per pre-operative assessment        

## 2018-01-08 NOTE — Discharge Instructions (Signed)
° ° °  Keep dressing dry. Elevated wrist above heart. Apply ice to palm side of wrist two hours on and one half hour off for 48 hours. May Apply ice at night and go to sleep with out changing. Be sure to keep ice off fingers to prevent frost bite.  Return to office in two weeks for removal of sutures left wrist.  Post Anesthesia Home Care Instructions  Activity: Get plenty of rest for the remainder of the day. A responsible individual must stay with you for 24 hours following the procedure.  For the next 24 hours, DO NOT: -Drive a car -Operate machinery -Drink alcoholic beverages -Take any medication unless instructed by your physician -Make any legal decisions or sign important papers.  Meals: Start with liquid foods such as gelatin or soup. Progress to regular foods as tolerated. Avoid greasy, spicy, heavy foods. If nausea and/or vomiting occur, drink only clear liquids until the nausea and/or vomiting subsides. Call your physician if vomiting continues.  Special Instructions/Symptoms: Your throat may feel dry or sore from the anesthesia or the breathing tube placed in your throat during surgery. If this causes discomfort, gargle with warm salt water. The discomfort should disappear within 24 hours.  If you had a scopolamine patch placed behind your ear for the management of post- operative nausea and/or vomiting:  1. The medication in the patch is effective for 72 hours, after which it should be removed.  Wrap patch in a tissue and discard in the trash. Wash hands thoroughly with soap and water. 2. You may remove the patch earlier than 72 hours if you experience unpleasant side effects which may include dry mouth, dizziness or visual disturbances. 3. Avoid touching the patch. Wash your hands with soap and water after contact with the patch.     

## 2018-01-08 NOTE — Brief Op Note (Signed)
01/08/2018  2:13 PM  PATIENT:  Samantha Garcia  84 y.o. female  PRE-OPERATIVE DIAGNOSIS:  left carpal tunnel syndrome  POST-OPERATIVE DIAGNOSIS:  left carpal tunnel syndrome  PROCEDURE:  Procedure(s): LEFT CARPAL TUNNEL RELEASE (Left)  SURGEON:  Surgeon(s) and Role:    * Jessy Oto, MD - Primary  PHYSICIAN ASSISTANT: Benjiman Core, PA-C  ANESTHESIA:   local and general, Dr. Albertha Ghee  EBL:  2 mL   BLOOD ADMINISTERED:none  DRAINS: none   LOCAL MEDICATIONS USED:  MARCAINE 0.5% Amount: 10 ml  SPECIMEN:  No Specimen  DISPOSITION OF SPECIMEN:  N/A  COUNTS:  YES  TOURNIQUET:   Total Tourniquet Time Documented: Forearm (Left) - 13 minutes Total: Forearm (Left) - 13 minutes   DICTATION: .Viviann Spare Dictation  PLAN OF CARE: Discharge to home after PACU  PATIENT DISPOSITION:  PACU - hemodynamically stable.   Delay start of Pharmacological VTE agent (>24hrs) due to surgical blood loss or risk of bleeding: yes

## 2018-01-08 NOTE — H&P (Addendum)
Samantha Garcia is an 84 y.o. female.   Chief Complaint: left hand pain , numbness, tingling HPI: patient with hx of left carpal tunnel syndrome and the above complaint presents for surgical intervention.    Past Medical History:  Diagnosis Date  . Arthritis    B Knee  . Atrial fibrillation ([place])   . Cancer ([place])    Left Breast; 5 years cancer free(7/17)  . Complication of anesthesia    Pt stated it took her a long time to wake up after having Hysterectomy in the 70s, but has not had any issues since  . Dysrhythmia    Atrial Fibrillation  . Hyperlipidemia   . Hypertension    controlled by medication  . PPD positive 1968    Past Surgical History:  Procedure Laterality Date  . ABDOMINAL HYSTERECTOMY  09/20/75  . APPENDECTOMY    . BACK SURGERY  03/12/10  . BREAST LUMPECTOMY Left   . BREAST SURGERY Left 11/14/10   DCIS carcinoma InSitu   . CARDIAC CATHETERIZATION    . CARPAL TUNNEL RELEASE Right 12/04/2017   Procedure: RIGHT OPEN CARPAL TUNNEL RELEASE;  Surgeon: Jessy Oto, MD;  Location: [facility];  Service: Orthopedics;  Laterality: Right;  Bier block  . CATARACT EXTRACTION Bilateral   . COLONOSCOPY W/ POLYPECTOMY    . COLPORRHAPHY  02/15/01   Anterior repair   . HARDWARE REMOVAL N/A 09/28/2015   Procedure: REMOVAL OF RIGHT PEDICLE SCREWS AND ROD L3, L4, L5 [place] approach;  Surgeon: Jessy Oto, MD;  Location: [facility];  Service: Orthopedics;  Laterality: N/A;  . ROTATOR CUFF REPAIR Left 06/15/08    History reviewed. No pertinent family history. Social History:  reports that she quit smoking about 52 years ago. Her smoking use included cigarettes. She has a 3.00 pack-year smoking history. She has never used smokeless tobacco. She reports that she does not drink alcohol or use drugs.  Allergies:  Allergies  Allergen Reactions  . Mushroom Extract Complex Hives  . Statins Other (See Comments)    MUSCLE PAIN  . Amoxicillin-Pot Clavulanate Other (See Comments)   Loose stools    Medications Prior to Admission  Medication Sig Dispense Refill  . aspirin-acetaminophen-caffeine (EXCEDRIN EXTRA STRENGTH) 250-250-65 MG tablet Take by mouth every 6 (six) hours as needed for headache.    . B Complex Vitamins (VITAMIN B COMPLEX) TABS Take 1 tablet by mouth daily.    . chlorthalidone (HYGROTON) 25 MG tablet Take 25 mg by mouth daily.  3  . Cholecalciferol (VITAMIN D3) 5000 units CAPS Take by mouth.    . Coenzyme Q10 100 MG capsule Take 300 mg by mouth daily.    Marland Kitchen diltiazem (CARDIZEM CD) 120 MG 24 hr capsule Take 120 mg by mouth daily.  2  . diphenhydramine-acetaminophen (TYLENOL PM) 25-500 MG TABS tablet Take 1 tablet by mouth at bedtime as needed.    . gabapentin (NEURONTIN) 300 MG capsule TAKE 2 CAPSULES BY MOUTH AT BEDTIME AND 1 DAILY IN THE MORNING 270 capsule 3  . magnesium oxide (MAG-OX) 400 MG tablet Take 400 mg by mouth daily.    . Melatonin 3 MG TABS Take by mouth.    . rivaroxaban (XARELTO) 20 MG TABS tablet Take by mouth.    . traMADol (ULTRAM) 50 MG tablet Take 1 tablet (50 mg total) by mouth every 6 (six) hours as needed. 40 tablet 0    Results for orders placed or performed during the hospital encounter  of 01/08/18 (from the past 48 hour(s))  I-STAT, chem 8     Status: Abnormal   Collection Time: 01/08/18 12:15 PM  Result Value Ref Range   Sodium 138 135 - 145 mmol/L   Potassium 3.0 (L) 3.5 - 5.1 mmol/L   Chloride 101 98 - 111 mmol/L   BUN 18 8 - 23 mg/dL   Creatinine, Ser 0.80 0.44 - 1.00 mg/dL   Glucose, Bld 107 (H) 70 - 99 mg/dL   Calcium, Ion 1.22 1.15 - 1.40 mmol/L   TCO2 25 22 - 32 mmol/L   Hemoglobin 13.3 12.0 - 15.0 g/dL   HCT 39.0 36.0 - 46.0 %   No results found.  Review of Systems  Constitutional: Negative.   HENT: Negative.   Respiratory: Negative.   Cardiovascular: Negative.   Gastrointestinal: Negative.   Genitourinary: Negative.   Musculoskeletal: Negative.   Skin: Negative.   Neurological: Positive for  tingling.  Psychiatric/Behavioral: Negative.     Blood pressure 113/83, pulse 92, temperature 98.3 F (36.8 C), temperature source Oral, resp. rate 16, height 5' 4.5" (1.638 m), weight 149 lb (67.6 kg), SpO2 99 %. Physical Exam  Constitutional: She is oriented to person, place, and time. She appears well-nourished.  HENT:  Head: Normocephalic and atraumatic.  Eyes: Pupils are equal, round, and reactive to light. EOM are normal.  Respiratory: Effort normal. No respiratory distress.  GI: She exhibits no distension.  Musculoskeletal: She exhibits tenderness.  Neurological: She is alert and oriented to person, place, and time.  Skin: Skin is warm and dry.  Psychiatric: She has a normal mood and affect.     Assessment/Plan Left carpal tunnel syndrome  Will proceed with left carpal tunnel release as scheduled.  Surgical procedure discussed in detail and all questions answered.    Benjiman Core, PA-C 01/08/2018, 12:48 PM

## 2018-01-08 NOTE — Interval H&P Note (Signed)
History and Physical Interval Note:  01/08/2018 1:05 PM  Samantha Garcia  has presented today for surgery, with the diagnosis of left carpal tunnel syndrome  The various methods of treatment have been discussed with the patient and family. After consideration of risks, benefits and other options for treatment, the patient has consented to  Procedure(s): LEFT CARPAL TUNNEL RELEASE (Left) as a surgical intervention .  The patient's history has been reviewed, patient examined, no change in status, stable for surgery.  I have reviewed the patient's chart and labs.  Questions were answered to the patient's satisfaction.     Basil Dess

## 2018-01-08 NOTE — Anesthesia Preprocedure Evaluation (Signed)
Anesthesia Evaluation  Patient identified by MRN, date of birth, ID band Patient awake    Reviewed: Allergy & Precautions, H&P , NPO status , Patient's Chart, lab work & pertinent test results  Airway Mallampati: II   Neck ROM: full    Dental   Pulmonary former smoker,    breath sounds clear to auscultation       Cardiovascular hypertension, + dysrhythmias Atrial Fibrillation  Rhythm:regular Rate:Normal     Neuro/Psych  Neuromuscular disease    GI/Hepatic   Endo/Other    Renal/GU      Musculoskeletal  (+) Arthritis ,   Abdominal   Peds  Hematology   Anesthesia Other Findings   Reproductive/Obstetrics                             Anesthesia Physical Anesthesia Plan  ASA: III  Anesthesia Plan: MAC   Post-op Pain Management:    Induction: Intravenous  PONV Risk Score and Plan: 2 and Ondansetron, Propofol infusion and Treatment may vary due to age or medical condition  Airway Management Planned: Simple Face Mask  Additional Equipment:   Intra-op Plan:   Post-operative Plan:   Informed Consent: I have reviewed the patients History and Physical, chart, labs and discussed the procedure including the risks, benefits and alternatives for the proposed anesthesia with the patient or authorized representative who has indicated his/her understanding and acceptance.     Plan Discussed with: CRNA, Anesthesiologist and Surgeon  Anesthesia Plan Comments:         Anesthesia Quick Evaluation

## 2018-01-08 NOTE — Anesthesia Postprocedure Evaluation (Deleted)
Anesthesia Post Note  Patient: Samantha Garcia  Procedure(s) Performed: LEFT CARPAL TUNNEL RELEASE (Left Hand)     Patient location during evaluation: PACU Anesthesia Type: MAC and Bier Block Level of consciousness: awake and alert Pain management: pain level controlled Vital Signs Assessment: post-procedure vital signs reviewed and stable Respiratory status: spontaneous breathing, nonlabored ventilation, respiratory function stable and patient connected to nasal cannula oxygen Cardiovascular status: stable and blood pressure returned to baseline Postop Assessment: no apparent nausea or vomiting Anesthetic complications: no    Last Vitals:  Vitals:   01/08/18 1445 01/08/18 1500  BP: 140/79 (!) 142/68  Pulse: 61 (!) 56  Resp: 13 16  Temp:    SpO2: 95% 94%    Last Pain:  Vitals:   01/08/18 1445  TempSrc:   PainSc: 0-No pain                 [name]

## 2018-01-08 NOTE — Op Note (Signed)
01/08/2018  2:15 PM  PATIENT:  Samantha Garcia  84 y.o. female  MRN: [id number]  PRE-OPERATIVE DIAGNOSIS:  left carpal tunnel syndrome  POST-OPERATIVE DIAGNOSIS:  left carpal tunnel syndrome  PROCEDURE:  Procedure(s): LEFT CARPAL TUNNEL RELEASE   OPERATIVE REPORT     SURGEON:  Jessy Oto, MD      ASSISTANT:  Benjiman Core, PA-C  (Present throughout the entire procedure and necessary for completion of procedure in a timely manner)      ANESTHESIA:  General anesthesia with EUMLA, Supplemented with local Marcaine 0.5 % 10cc Dr. Albertha Ghee     COMPLICATIONS:  None.      TOURNIQUET TIME: 12 minutes at  290mHg   PROCEDURE: The patient was met in the holding area, and the appropriate left wrist identified and marked with an "X" and my initials. The patient was then transported to OR and was placed on the operative table in a supine position. The patient was then placed under general [facility]. The patient received appropriate preoperative antibiotic prophylaxis.     The left upper extremity was then prepped using sterile conditions and draped using sterile technique.  Time-out procedure was called and correct  .Using loope magnification and head lamp a 1.5 inch incision curved at the wrist crease with 15 blade scalpel.  Incision through skin and subcutaneous tissue to the volar left forearm fascia and  transverse carpal ligament. Fascia then carefully lifed and incised with Stevens scissors inline with the fourth digit. The skin and subcutaneous tissue retracted and the volar fascia divided under direct vision from distal to proximal. A freer elevator then carefully placed between the left median nerve and the transverse carpal ligament protecting the  median nerve as the transverse carpal ligament was divided with a 15 blade scalpel in line with the fourth digit. Retracting the distal skin and subcutaneous tissues distally under direct visualization the  remaining portions of the transverse carpal ligament were divided with iridectomy scissors again in line with the fourth digit. The palmar fascia was then divided until the traversing superficial palmar arch was identified and preserved intact.  The motor branch of the median nerve was carefully examined and identified intact. Tourniquet was then released. Bleeding controlled with bipolar electrocautery. The incision was then irrigated with copious amounts of irrigant solution, No active bleeding was present. The incision closed with a single layer skin closure of 4-0 nylon horizontal mattress sutures. dermabond was then applied.  Dry dressing of adaptic, 4x4s held in place with sterile webril.  A well padded volar splint applied with ace wrap.  The patient reactivated and returned to the PACU in good condition.  All instruments and sponge counts were correct.          JBasil Dess 01/08/2018, 2:15 PM

## 2018-01-08 NOTE — Anesthesia Postprocedure Evaluation (Signed)
Anesthesia Post Note  Patient: Samantha Garcia  Procedure(s) Performed: LEFT CARPAL TUNNEL RELEASE (Left Hand)     Patient location during evaluation: PACU Anesthesia Type: General Level of consciousness: awake and alert Pain management: pain level controlled Vital Signs Assessment: post-procedure vital signs reviewed and stable Respiratory status: spontaneous breathing, nonlabored ventilation, respiratory function stable and patient connected to nasal cannula oxygen Cardiovascular status: blood pressure returned to baseline and stable Postop Assessment: no apparent nausea or vomiting Anesthetic complications: no    Last Vitals:  Vitals:   01/08/18 1445 01/08/18 1500  BP: 140/79 (!) 142/68  Pulse: 61 (!) 56  Resp: 13 16  Temp:    SpO2: 95% 94%    Last Pain:  Vitals:   01/08/18 1445  TempSrc:   PainSc: 0-No pain                 Darlis Wragg S

## 2018-01-08 NOTE — Transfer of Care (Signed)
Immediate Anesthesia Transfer of Care Note  Patient: Samantha Garcia  Procedure(s) Performed: LEFT CARPAL TUNNEL RELEASE (Left Hand)  Patient Location: PACU  Anesthesia Type:General  Level of Consciousness: awake and sedated  Airway & Oxygen Therapy: Patient Spontanous Breathing and Patient connected to face mask oxygen  Post-op Assessment: Report given to RN and Post -op Vital signs reviewed and stable  Post vital signs: Reviewed and stable  Last Vitals:  Vitals Value Taken Time  BP 119/65 01/08/2018  2:06 PM  Temp    Pulse 58 01/08/2018  2:08 PM  Resp 16 01/08/2018  2:08 PM  SpO2 97 % 01/08/2018  2:08 PM  Vitals shown include unvalidated device data.  Last Pain:  Vitals:   01/08/18 1210  TempSrc: Oral  PainSc: 0-No pain         Complications: No apparent anesthesia complications

## 2018-01-11 ENCOUNTER — Encounter (HOSPITAL_BASED_OUTPATIENT_CLINIC_OR_DEPARTMENT_OTHER): Payer: Self-pay | Admitting: Specialist

## 2018-01-21 ENCOUNTER — Ambulatory Visit (INDEPENDENT_AMBULATORY_CARE_PROVIDER_SITE_OTHER): Payer: PPO | Admitting: Surgery

## 2018-01-21 ENCOUNTER — Encounter (INDEPENDENT_AMBULATORY_CARE_PROVIDER_SITE_OTHER): Payer: Self-pay | Admitting: Surgery

## 2018-01-21 DIAGNOSIS — Z9889 Other specified postprocedural states: Secondary | ICD-10-CM

## 2018-01-21 NOTE — Progress Notes (Signed)
84 year old white female who is about 2 weeks status post left carpal tunnel release returns.  She is doing well.  No complaints.  She is also status post right carpal tunnel release December 04, 2017 and this is doing well also.   Exam Very pleasant elderly white female alert and oriented in no acute distress.  Left hand and looks good.  Sutures removed and Steri-Strips applied.  No drainage or signs of infection.  Moves fingers and wrist very well for 2 weeks postop.    Plan Patient states that she has a removable wrist splint and she will use this over the next couple weeks.  Follow-up in 4 weeks for recheck.  No aggressive activity with her left hand.  All questions answered.

## 2018-01-29 DIAGNOSIS — I4891 Unspecified atrial fibrillation: Secondary | ICD-10-CM | POA: Diagnosis not present

## 2018-01-29 DIAGNOSIS — R32 Unspecified urinary incontinence: Secondary | ICD-10-CM | POA: Diagnosis not present

## 2018-01-29 DIAGNOSIS — Z0000 Encounter for general adult medical examination without abnormal findings: Secondary | ICD-10-CM | POA: Diagnosis not present

## 2018-01-29 DIAGNOSIS — C50919 Malignant neoplasm of unspecified site of unspecified female breast: Secondary | ICD-10-CM | POA: Diagnosis not present

## 2018-01-29 DIAGNOSIS — N8110 Cystocele, unspecified: Secondary | ICD-10-CM | POA: Diagnosis not present

## 2018-01-29 DIAGNOSIS — I10 Essential (primary) hypertension: Secondary | ICD-10-CM | POA: Diagnosis not present

## 2018-01-29 DIAGNOSIS — E785 Hyperlipidemia, unspecified: Secondary | ICD-10-CM | POA: Diagnosis not present

## 2018-01-29 DIAGNOSIS — Z6825 Body mass index (BMI) 25.0-25.9, adult: Secondary | ICD-10-CM | POA: Diagnosis not present

## 2018-02-18 ENCOUNTER — Ambulatory Visit (INDEPENDENT_AMBULATORY_CARE_PROVIDER_SITE_OTHER): Payer: PPO | Admitting: Surgery

## 2018-02-18 ENCOUNTER — Encounter (INDEPENDENT_AMBULATORY_CARE_PROVIDER_SITE_OTHER): Payer: Self-pay | Admitting: Surgery

## 2018-02-18 DIAGNOSIS — Z9889 Other specified postprocedural states: Secondary | ICD-10-CM

## 2018-02-18 NOTE — Progress Notes (Signed)
84 year old white female who is about 6 weeks status post left carpal tunnel release returns.  States that she is doing well.  She is very pleased with her surgical result up to this point.  She is also status post right carpal tunnel release December 04, 2017 and she continues to have some residual numbness and tingling in her fingers but overall this is much better than preop.  Exam  Left hand wound is well-healed.  Good grip strength.   Plan Patient is doing very well at this point.  She will follow-up in the office as needed.  Let us know if there are any issues or concerns.

## 2018-03-10 DIAGNOSIS — N8110 Cystocele, unspecified: Secondary | ICD-10-CM | POA: Diagnosis not present

## 2018-03-10 DIAGNOSIS — N3941 Urge incontinence: Secondary | ICD-10-CM | POA: Diagnosis not present

## 2018-03-10 DIAGNOSIS — Z4689 Encounter for fitting and adjustment of other specified devices: Secondary | ICD-10-CM | POA: Diagnosis not present

## 2018-03-17 ENCOUNTER — Telehealth (INDEPENDENT_AMBULATORY_CARE_PROVIDER_SITE_OTHER): Payer: Self-pay | Admitting: Radiology

## 2018-03-17 ENCOUNTER — Other Ambulatory Visit (INDEPENDENT_AMBULATORY_CARE_PROVIDER_SITE_OTHER): Payer: Self-pay | Admitting: Radiology

## 2018-03-17 NOTE — Telephone Encounter (Signed)
Requests refill on Tramadol and Gabapentin (almost out)

## 2018-03-17 NOTE — Telephone Encounter (Signed)
Patient left voicemail requesting copy of knee x-rays by Dr. Louanne Skye. I returned call and lmom that I have made CD and holding in x-ray until patient returns call.   Patient needs to fill out medical release forms as well as give address to facility she wants CD sent to or if can pick up in office.

## 2018-03-17 NOTE — Telephone Encounter (Signed)
Patient returned call. I have placed medical release form in mail today. Patient will sign form and call when placed back into mail. I will hold CD in x-ray until form is signed and place in mail.

## 2018-03-18 MED ORDER — GABAPENTIN 300 MG PO CAPS: ORAL_CAPSULE | ORAL | 3 refills | Status: DC

## 2018-03-18 MED ORDER — TRAMADOL HCL 50 MG PO TABS: 50.0000 mg | ORAL_TABLET | Freq: Four times a day (QID) | ORAL | 0 refills | Status: DC | PRN

## 2018-03-19 ENCOUNTER — Other Ambulatory Visit (INDEPENDENT_AMBULATORY_CARE_PROVIDER_SITE_OTHER): Payer: Self-pay | Admitting: Specialist

## 2018-03-19 NOTE — Telephone Encounter (Signed)
Called tramadol into pharm

## 2018-03-22 NOTE — Telephone Encounter (Signed)
Tramadol refill request 

## 2018-03-22 NOTE — Telephone Encounter (Signed)
[facility] 

## 2018-03-25 ENCOUNTER — Telehealth (INDEPENDENT_AMBULATORY_CARE_PROVIDER_SITE_OTHER): Payer: Self-pay | Admitting: Radiology

## 2018-03-25 NOTE — Telephone Encounter (Signed)
CD of x-rays has been placed in the mail today 03/25/18

## 2018-04-07 DIAGNOSIS — M170 Bilateral primary osteoarthritis of knee: Secondary | ICD-10-CM | POA: Diagnosis not present

## 2018-04-23 DIAGNOSIS — Z4689 Encounter for fitting and adjustment of other specified devices: Secondary | ICD-10-CM | POA: Diagnosis not present

## 2018-06-11 ENCOUNTER — Other Ambulatory Visit (INDEPENDENT_AMBULATORY_CARE_PROVIDER_SITE_OTHER): Payer: Self-pay | Admitting: Specialist

## 2018-06-11 ENCOUNTER — Other Ambulatory Visit (INDEPENDENT_AMBULATORY_CARE_PROVIDER_SITE_OTHER): Payer: Self-pay | Admitting: Radiology

## 2018-06-11 MED ORDER — GABAPENTIN 600 MG PO TABS: ORAL_TABLET | ORAL | 0 refills | Status: DC

## 2018-06-11 MED ORDER — TRAMADOL HCL 50 MG PO TABS: 50.0000 mg | ORAL_TABLET | Freq: Two times a day (BID) | ORAL | 0 refills | Status: DC | PRN

## 2018-06-11 NOTE — Telephone Encounter (Signed)
I called and lmom for pt that we cannot call in meds out of state for 3 months, and that the tramadol can only be done for 1 week at a time.  I advised her to check with her pharmacy later.

## 2018-06-11 NOTE — Telephone Encounter (Signed)
-----   Message from [place] sent at 06/11/2018  3:25 PM EST ----- This is patient that called about going to [facility] for 3 months and requesting 3 month supply of Tramadol and Gabapentin.  Please call patient @ [phone]. He is flying out Monday and would like to get this before she leaves.  Thank you.  [place]

## 2018-07-05 ENCOUNTER — Telehealth (INDEPENDENT_AMBULATORY_CARE_PROVIDER_SITE_OTHER): Payer: Self-pay | Admitting: Specialist

## 2018-07-05 NOTE — Telephone Encounter (Signed)
Patient called concerning refill for (Tramadol)  Patient said she is willing to get the Tramadol 1 week at a time. Patient said [facility]   The ph# is [phone]   The number to contact patient is [phone]

## 2018-07-08 NOTE — Telephone Encounter (Signed)
I called and advised patient that we can not send Tramadol to her in [place], she was states the Dr. Louanne Garcia did it for her last year and she was given a 3 month supply then.  I advised that due to the new rules on these meds that we can only do a 7 day supply on them now.  She is asking for anything at this point that can be sent for her as she state that she is in so much pain she can't hardly move.  Please advise.

## 2018-07-27 ENCOUNTER — Telehealth (INDEPENDENT_AMBULATORY_CARE_PROVIDER_SITE_OTHER): Payer: Self-pay

## 2018-07-27 NOTE — Telephone Encounter (Signed)
Son called back again asking for a week supply of the Tramadol. He's very upset and frustrated stating she's old and we are making her suffer waiting for this.  [facility] [phone]

## 2018-07-27 NOTE — Telephone Encounter (Signed)
Son called back again asking for a week supply of the Tramadol. He's very upset and frustrated stating she's old and we are making her suffer waiting for this.  [facility] [phone]

## 2018-07-29 ENCOUNTER — Other Ambulatory Visit (INDEPENDENT_AMBULATORY_CARE_PROVIDER_SITE_OTHER): Payer: Self-pay | Admitting: Radiology

## 2018-07-29 ENCOUNTER — Telehealth (INDEPENDENT_AMBULATORY_CARE_PROVIDER_SITE_OTHER): Payer: Self-pay

## 2018-07-29 MED ORDER — TRAMADOL HCL 50 MG PO TABS: 50.0000 mg | ORAL_TABLET | Freq: Two times a day (BID) | ORAL | 0 refills | Status: DC | PRN

## 2018-07-29 NOTE — Telephone Encounter (Signed)
Nicki Reaper, patient's son called, I spoke with him regarding Prescription for tramadol for patient.  He says patient is with him for a month more, then returning home.  They did not get the [facility] locally because she had already went to [place] with him.  Per Dr Louanne Skye, ok to call in Rx tramadol for her there in [place].  I have called and s/w pharmacist, also called and LMVM for son advising. Son's phone number is [phone].

## 2018-07-29 NOTE — Telephone Encounter (Signed)
Patient's son called concerning Rx for Tramadol.  Advised him that Dr. Louanne Skye had not responded about Rx and that I could let him speak with my supervisor.   Ander Purpura with patient's son about Rx.

## 2018-08-11 ENCOUNTER — Other Ambulatory Visit (INDEPENDENT_AMBULATORY_CARE_PROVIDER_SITE_OTHER): Payer: Self-pay | Admitting: Radiology

## 2018-08-11 MED ORDER — TRAMADOL HCL 50 MG PO TABS: 50.0000 mg | ORAL_TABLET | Freq: Two times a day (BID) | ORAL | 0 refills | Status: DC | PRN

## 2018-08-11 NOTE — Telephone Encounter (Signed)
I sent request to Dr. Louanne Skye

## 2018-08-11 NOTE — Telephone Encounter (Signed)
Requesting tramadol refill for patient, she is still in [place], and will be returning to [facility] on March 25th, and will make appt to see you then.  Son called and is asking refill to be sent to the pharmacy in [place]- saved in chart. Please advise.

## 2018-08-11 NOTE — Addendum Note (Signed)
Addended by: Minda Ditto, Alyse Low N on: 08/11/2018 11:03 AM   Modules accepted: Orders

## 2018-08-17 ENCOUNTER — Telehealth (INDEPENDENT_AMBULATORY_CARE_PROVIDER_SITE_OTHER): Payer: Self-pay

## 2018-08-17 ENCOUNTER — Other Ambulatory Visit (INDEPENDENT_AMBULATORY_CARE_PROVIDER_SITE_OTHER): Payer: Self-pay | Admitting: Specialist

## 2018-08-17 NOTE — Telephone Encounter (Signed)
Patients son Nicki Reaper left message on triage phone voicing his frustration that his mother still has not gotten her medication.  He stated that he has gone [facility] the [facility] [place] twice and it is not there. He stated that this is a problem and asked for Abigail Butts to return his call to discuss this. [phone]

## 2018-08-17 NOTE — Telephone Encounter (Signed)
I called the pharmacy and they did not rec'd the fax on 08/11/18.  I called tramadol into [facility] at [place] in [place].

## 2018-08-17 NOTE — Telephone Encounter (Signed)
Tramadol refill request 

## 2018-08-17 NOTE — Telephone Encounter (Signed)
Samantha Garcia called and lmom for Samantha Garcia that the meds were sent in on 08/11/2018, and that they did not rec'e them but that we did call them in again today

## 2018-09-08 ENCOUNTER — Other Ambulatory Visit (INDEPENDENT_AMBULATORY_CARE_PROVIDER_SITE_OTHER): Payer: Self-pay | Admitting: Specialist

## 2018-09-08 NOTE — Telephone Encounter (Signed)
Gabapentin refill request 

## 2018-09-21 ENCOUNTER — Other Ambulatory Visit (INDEPENDENT_AMBULATORY_CARE_PROVIDER_SITE_OTHER): Payer: Self-pay | Admitting: Specialist

## 2018-09-21 ENCOUNTER — Telehealth (INDEPENDENT_AMBULATORY_CARE_PROVIDER_SITE_OTHER): Payer: Self-pay | Admitting: Specialist

## 2018-09-21 MED ORDER — TRAMADOL HCL 50 MG PO TABS: 50.0000 mg | ORAL_TABLET | Freq: Two times a day (BID) | ORAL | 0 refills | Status: DC

## 2018-09-21 NOTE — Telephone Encounter (Signed)
Patient called requesting an RX refill on her Tramadol and have it [facility]..  Thank you.

## 2018-09-21 NOTE — Telephone Encounter (Signed)
Sent request to Dr. Nitka 

## 2018-09-21 NOTE — Telephone Encounter (Signed)
Rx refill Tramadol [facility]

## 2018-09-21 NOTE — Telephone Encounter (Signed)
This has already been addressed

## 2018-09-30 ENCOUNTER — Other Ambulatory Visit (INDEPENDENT_AMBULATORY_CARE_PROVIDER_SITE_OTHER): Payer: Self-pay | Admitting: Specialist

## 2018-09-30 MED ORDER — TRAMADOL HCL 50 MG PO TABS: 50.0000 mg | ORAL_TABLET | Freq: Two times a day (BID) | ORAL | 0 refills | Status: DC

## 2018-09-30 NOTE — Telephone Encounter (Signed)
Rx refill tramadol

## 2018-10-05 ENCOUNTER — Other Ambulatory Visit (INDEPENDENT_AMBULATORY_CARE_PROVIDER_SITE_OTHER): Payer: Self-pay | Admitting: Specialist

## 2018-10-05 NOTE — Telephone Encounter (Signed)
The Rx for Tramadol was to [facility] in [place]/ [phone].please call pt when called in.pts callback [phone]

## 2018-10-05 NOTE — Telephone Encounter (Signed)
Sent request for tramadol to Dr. Louanne Skye

## 2018-10-06 ENCOUNTER — Telehealth (INDEPENDENT_AMBULATORY_CARE_PROVIDER_SITE_OTHER): Payer: Self-pay | Admitting: Specialist

## 2018-10-06 ENCOUNTER — Other Ambulatory Visit (INDEPENDENT_AMBULATORY_CARE_PROVIDER_SITE_OTHER): Payer: Self-pay | Admitting: Specialist

## 2018-10-06 MED ORDER — TRAMADOL HCL 50 MG PO TABS: 50.0000 mg | ORAL_TABLET | Freq: Two times a day (BID) | ORAL | 0 refills | Status: DC

## 2018-10-06 MED ORDER — TRAMADOL HCL 50 MG PO TABS: 100.0000 mg | ORAL_TABLET | Freq: Four times a day (QID) | ORAL | 0 refills | Status: DC | PRN

## 2018-10-06 NOTE — Telephone Encounter (Signed)
This is pending approval from Dr. Louanne Skye

## 2018-10-06 NOTE — Telephone Encounter (Signed)
Patient called again inquiring about her medical refill for her Tramadol.  [facility] [place]. The Pharmacy number is [id number].   CB#[phone].  Thank you.

## 2018-10-26 ENCOUNTER — Other Ambulatory Visit: Payer: Self-pay | Admitting: Specialist

## 2018-10-26 MED ORDER — TRAMADOL HCL 50 MG PO TABS: 100.0000 mg | ORAL_TABLET | Freq: Four times a day (QID) | ORAL | 0 refills | Status: DC | PRN

## 2018-10-26 NOTE — Telephone Encounter (Signed)
Patient called left voicemail message needing Rx refilled (Tramadol) [facility] on [address]   Phone # [phone] or [phone]

## 2018-10-27 IMAGING — MR MR LUMBAR SPINE WO/W CM
7 series · 35 of 48 positions shown · IV contrast (multihance)
Comparison: Lumbar MRI 09/07/2015

CLINICAL DATA: Scoliosis. Spondylosis without myelopathy.
Radiculopathy

EXAM:
MRI LUMBAR SPINE WITHOUT AND WITH CONTRAST
TECHNIQUE: Multiplanar and multiecho pulse sequences of the lumbar spine were
obtained without and with intravenous contrast.
CONTRAST:  14mL MULTIHANCE GADOBENATE DIMEGLUMINE 529 MG/ML IV SOLN

[Series 3: T2 · sagittal · 4.0mm · 0.81mm/px · 4 of 17 slices shown (1 of 2)]
[im 1/17]
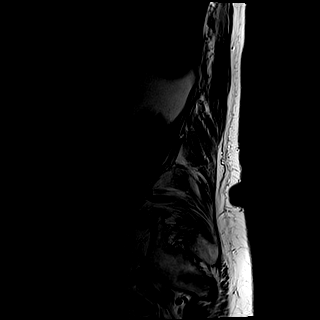
[im 6/17]
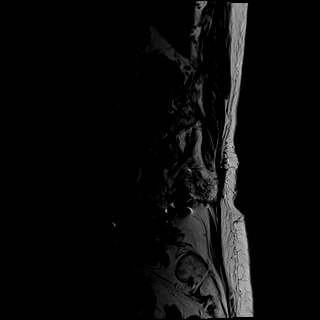
[im 11/17]
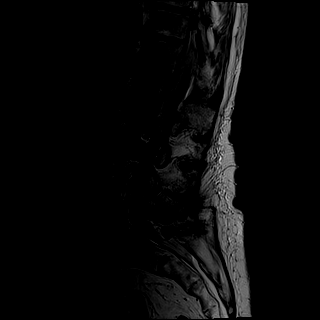
[im 17/17]
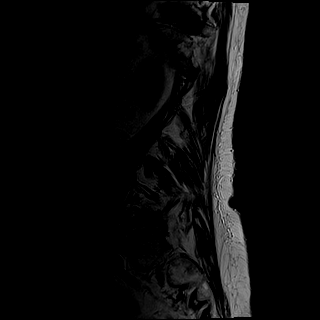

[Series 4: T1 · sagittal · 4.0mm · 0.81mm/px · 4 of 17 slices shown (1 of 2)]
[im 1/17]
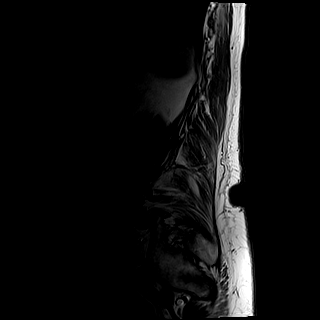
[im 6/17]
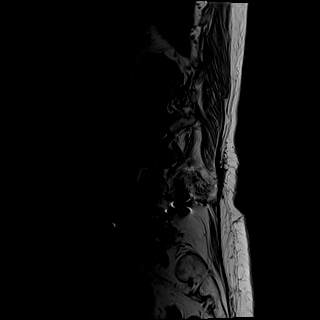
[im 11/17]
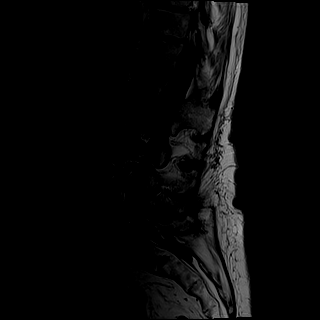
[im 17/17]
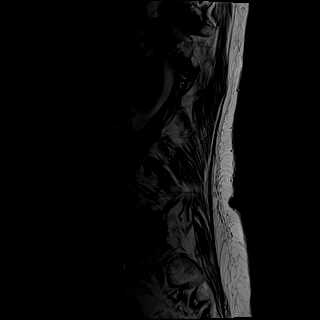

[Series 5: STIR · sagittal · 4.0mm · 1.02mm/px · 5 of 17 slices shown]
[im 1/17]
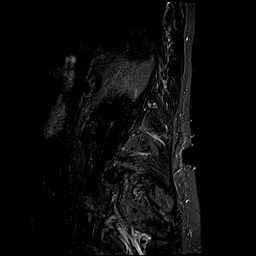
[im 5/17]
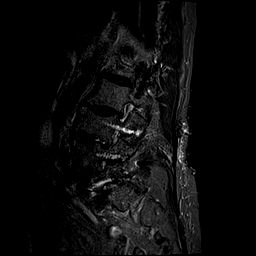
[im 9/17]
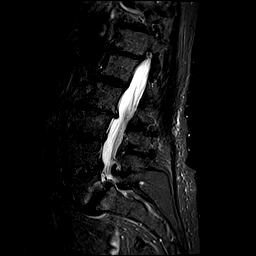
[im 13/17]
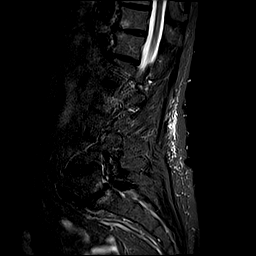
[im 17/17]
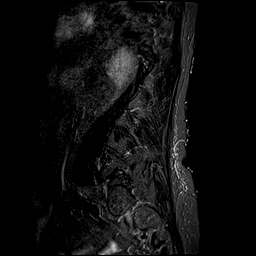

[Series 7: T2 · axial · 4.0mm · 0.78mm/px · z∈[+83,+280]mm · 8 of 36 slices shown (2 of 2)]
[im 1/36]
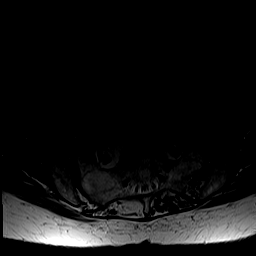
[im 4/36]
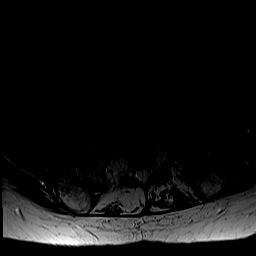
[im 12/36]
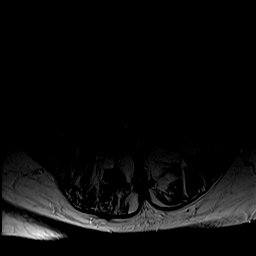
[im 16/36]
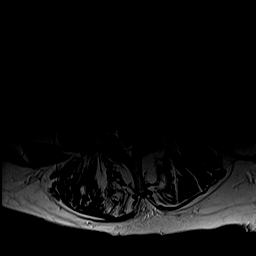
[im 20/36]
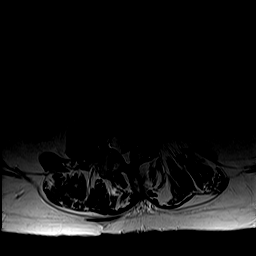
[im 24/36]
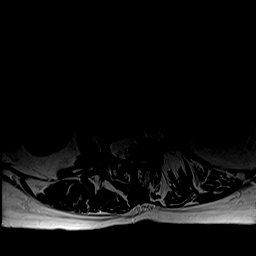
[im 32/36]
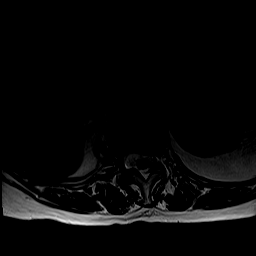
[im 36/36]
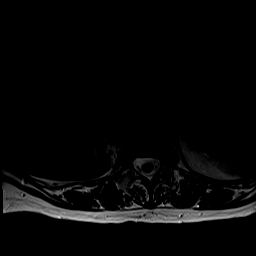

[Series 8: T1 · axial · 4.0mm · 0.39mm/px · z∈[+83,+280]mm · 8 of 36 slices shown (2 of 2)]
[im 1/36]
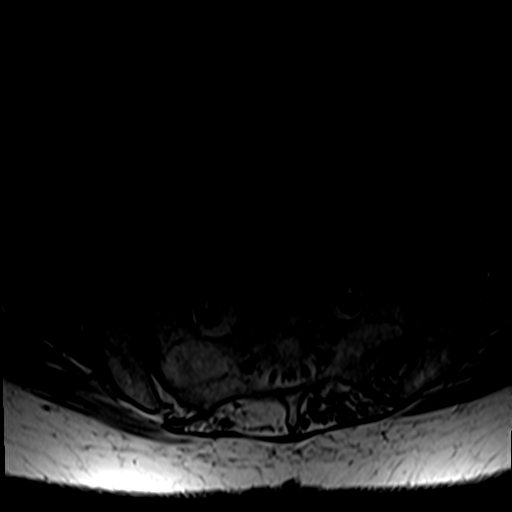
[im 4/36]
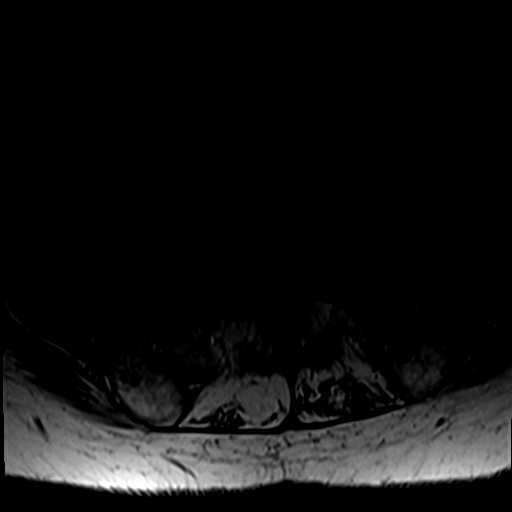
[im 12/36]
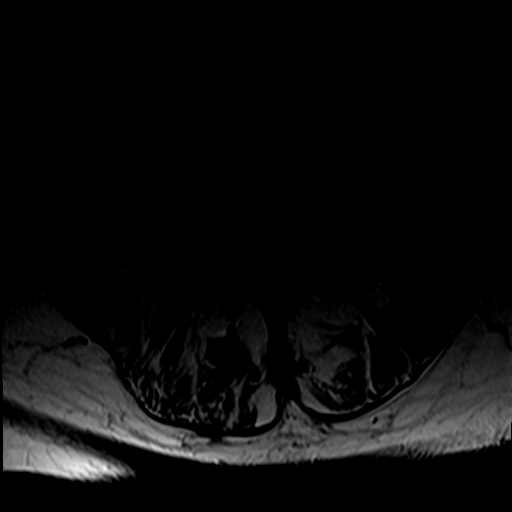
[im 16/36]
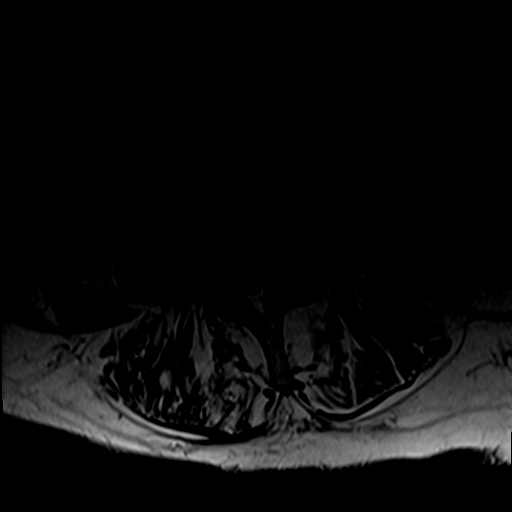
[im 20/36]
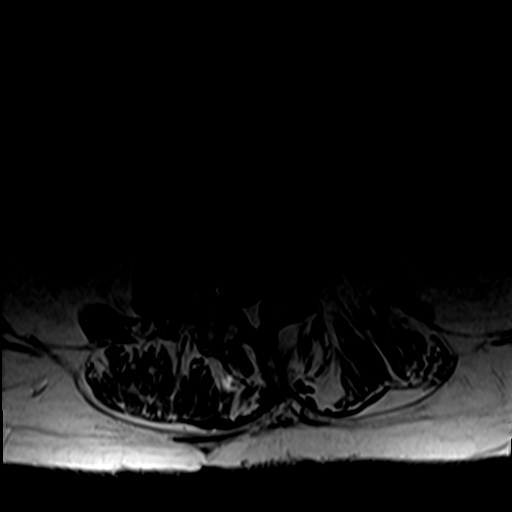
[im 24/36]
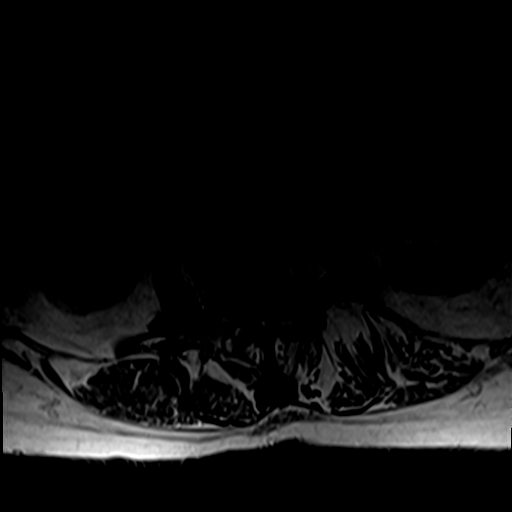
[im 32/36]
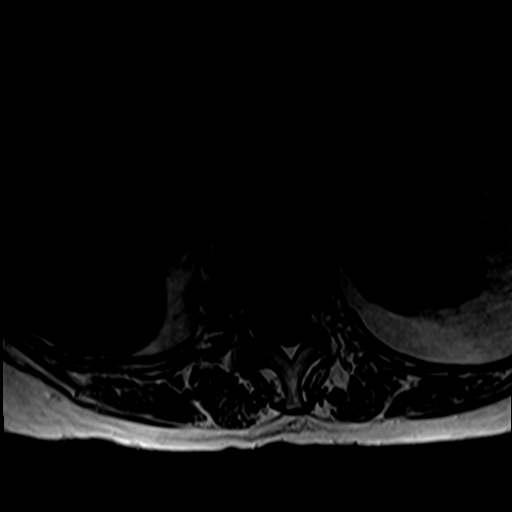
[im 36/36]
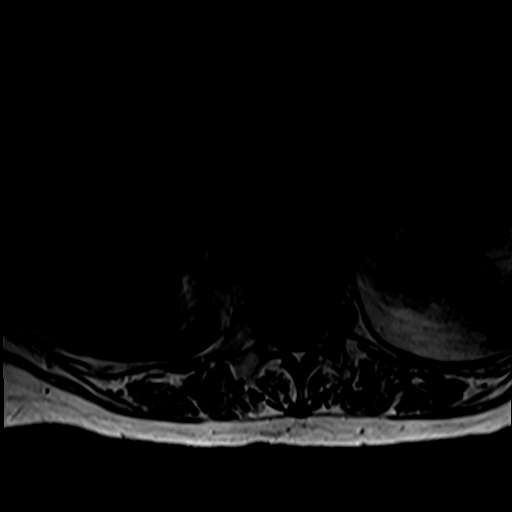

[Series 9: T1 fat-sat post-contrast · sagittal · 4.0mm · 0.81mm/px · 5 of 17 slices shown]
[im 1/17]
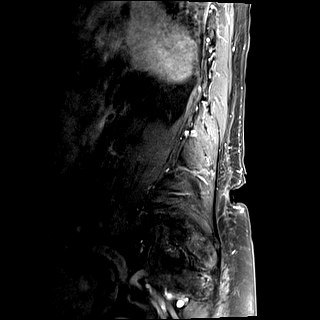
[im 5/17]
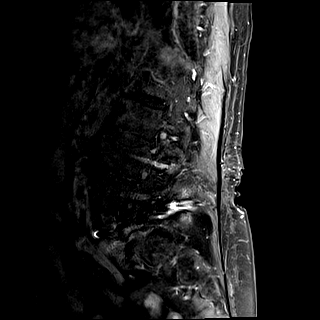
[im 9/17]
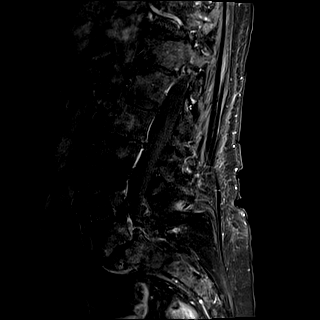
[im 13/17]
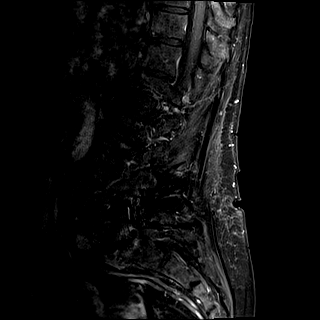
[im 17/17]
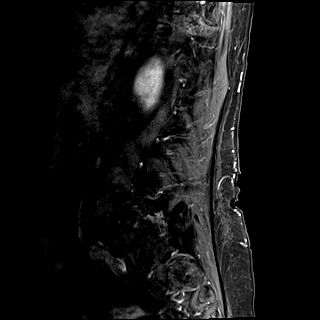

[Series 10: T1 post-contrast · axial · 4.0mm · 0.39mm/px · 1 of 36 slices shown]
[im 1/36]
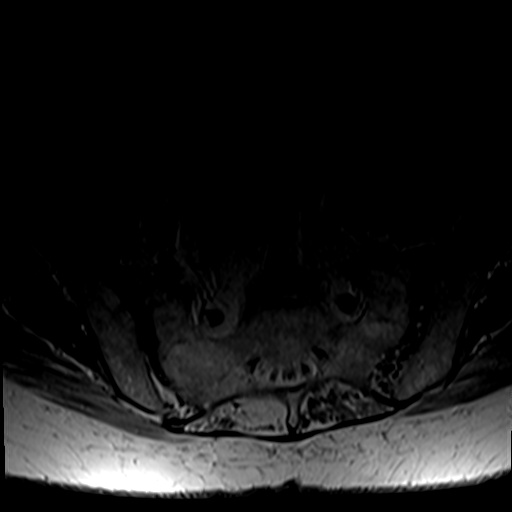

[35 of 48 positions shown; findings below may reference images not displayed]

FINDINGS: Segmentation:  Normal

Alignment: Mild retrolisthesis L2-3 and L3-4. 8 mm anterolisthesis
L5-S1 with bilateral pars defects of L5.

Vertebrae: Interval removal of bilateral pedicle screws and
posterior rods at L3, L4, and L5 on the right. No hardware was
present on the left on the prior study.

Conus medullaris: Extends to the L2-3 level and appears normal.

Paraspinal and other soft tissues: Negative for mass or adenopathy
or fluid collection

Disc levels:

T12-L1:  Disc degeneration and mild spurring

L1-2:  Mild disc degeneration without stenosis

L2-3: Disc degeneration and spurring on the left. Mild left
foraminal narrowing. Spinal canal normal in size

L3-4: Disc degeneration and spurring. Interbody spacer remains in
place. No significant spinal stenosis. Mild right foraminal
narrowing.

L4-5: Diffuse bulging of the disc. Bilateral facet degeneration.
Interbody spacer remains in place. No significant spinal or
foraminal stenosis

L5-S1: 8 mm anterolisthesis with bilateral pars defects. Severe
right foraminal encroachment unchanged with compression of the right
L5 nerve root. Left foramen patent.
IMPRESSION: Interval removal of pedicle screws on the right at L3, L4, and L5

8 mm anterolisthesis L5-S1 with bilateral pars defects of L5. Severe
right foraminal encroachment unchanged with compression of the right
L5 nerve root.

Mild degenerative change elsewhere in the lumbar spine as above.

## 2018-11-16 ENCOUNTER — Other Ambulatory Visit: Payer: Self-pay | Admitting: Specialist

## 2018-11-16 NOTE — Telephone Encounter (Signed)
Gabapentin refill request 

## 2018-11-24 ENCOUNTER — Other Ambulatory Visit: Payer: Self-pay

## 2018-11-24 NOTE — Telephone Encounter (Signed)
Patient would like a Rx refill [facility] [address] in [place], [place].  CB# is [phone].  Please advise.  Thank you.

## 2018-11-24 NOTE — Addendum Note (Signed)
Addended by: Minda Ditto, Alyse Low N on: 11/24/2018 10:29 AM   Modules accepted: Orders

## 2018-11-25 MED ORDER — TRAMADOL HCL 50 MG PO TABS: 100.0000 mg | ORAL_TABLET | Freq: Four times a day (QID) | ORAL | 0 refills | Status: DC | PRN

## 2018-12-14 ENCOUNTER — Other Ambulatory Visit: Payer: Self-pay | Admitting: Specialist

## 2018-12-14 MED ORDER — TRAMADOL HCL 50 MG PO TABS: 100.0000 mg | ORAL_TABLET | Freq: Four times a day (QID) | ORAL | 0 refills | Status: DC | PRN

## 2018-12-14 NOTE — Telephone Encounter (Signed)
Pt called in requesting a refill on her Tramadol.  [facility] [address]  [phone]

## 2018-12-14 NOTE — Telephone Encounter (Signed)
Sent request to Dr. Nitka 

## 2018-12-15 ENCOUNTER — Other Ambulatory Visit: Payer: Self-pay | Admitting: Specialist

## 2018-12-15 NOTE — Telephone Encounter (Signed)
Tramadol refill request 

## 2019-01-04 ENCOUNTER — Ambulatory Visit
Admission: EM | Admit: 2019-01-04 | Discharge: 2019-01-05 | Disposition: A | Discharge status: Home / Self Care | Payer: Medicare HMO | Attending: Urgent Care | Admitting: Urgent Care

## 2019-01-04 ENCOUNTER — Ambulatory Visit (INDEPENDENT_AMBULATORY_CARE_PROVIDER_SITE_OTHER): Payer: Medicare HMO

## 2019-01-04 ENCOUNTER — Other Ambulatory Visit: Payer: Self-pay

## 2019-01-04 DIAGNOSIS — L03116 Cellulitis of left lower limb: Secondary | ICD-10-CM

## 2019-01-04 DIAGNOSIS — M79672 Pain in left foot: Secondary | ICD-10-CM | POA: Diagnosis not present

## 2019-01-04 MED ORDER — DOXYCYCLINE HYCLATE 100 MG PO CAPS: 100.0000 mg | ORAL_CAPSULE | Freq: Two times a day (BID) | ORAL | 0 refills | Status: AC

## 2019-01-04 NOTE — Discharge Instructions (Addendum)
It was very nice seeing you today in clinic. Thank you for entrusting me with your care.   Please utilize the medications that we discussed. Your prescriptions have been called in to your pharmacy.  Monitor for signs and symptoms of infection, which would included increased redness, swelling, streaking, drainage, pain, and the development of a fever.  Soak foot twice a day in warm Epson salt water to help with swelling.  May apply ice to reduce swelling as well.   Make arrangements to follow up with your regular doctor in 1 week for re-evaluation if not improving. If your symptoms/condition worsens, please seek follow up care either here or in the ER. Please remember, our [place] providers are "right here with you" when you need [place].   Again, it was my pleasure to take care of you today. Thank you for choosing our clinic. I hope that you start to feel better quickly.   Honor Loh, MSN, APRN, FNP-C, CEN Advanced Practice Provider [facility]

## 2019-01-04 NOTE — ED Triage Notes (Signed)
Patient complains of foot pain and bruising and swelling x 6 days without injury.

## 2019-01-04 NOTE — ED Provider Notes (Signed)
[place], [place]   Name: Samantha Garcia DOB: Nov 12, 1931 MRN: [id number] CSN: [id number] PCP: Carolan Shiver, MD  Arrival date and time:  01/04/19 1756  Chief Complaint:  Foot Pain   NOTE: Prior to seeing the patient today, I have reviewed the triage nursing documentation and vital signs. Clinical staff has updated patient's PMH/PSHx, current medication list, and drug allergies/intolerances to ensure comprehensive history available to assist in medical decision making.   History:   HPI: Samantha Garcia is a 85 y.o. female who presents today with complaints of pain, swelling, and increased redness in her LEFT foot x 6 days. Patient denies injury. She is unaware of being bitten by anything. Bruising of the foot began approximately 3 days ago. Symptoms have improved since onset. She advises that it was initially "swollen up like an egg". Patient able to ambulate without difficulties.   Past Medical History:  Diagnosis Date  . Arthritis    B Knee  . Atrial fibrillation ([place])   . Cancer ([place])    Left Breast; 5 years cancer free(7/17)  . Complication of anesthesia    Pt stated it took her a long time to wake up after having Hysterectomy in the 70s, but has not had any issues since  . Dysrhythmia    Atrial Fibrillation  . Hyperlipidemia   . Hypertension    controlled by medication  . PPD positive 1968    Past Surgical History:  Procedure Laterality Date  . ABDOMINAL HYSTERECTOMY  09/20/75  . APPENDECTOMY    . BACK SURGERY  03/12/10  . BREAST LUMPECTOMY Left   . BREAST SURGERY Left 11/14/10   DCIS carcinoma InSitu   . CARDIAC CATHETERIZATION    . CARPAL TUNNEL RELEASE Right 12/04/2017   Procedure: RIGHT OPEN CARPAL TUNNEL RELEASE;  Surgeon: Jessy Oto, MD;  Location: [facility];  Service: Orthopedics;  Laterality: Right;  Bier block  . CARPAL TUNNEL RELEASE Left 01/08/2018   Procedure: LEFT CARPAL TUNNEL RELEASE;  Surgeon: Jessy Oto, MD;  Location: [facility];  Service: Orthopedics;  Laterality: Left;  . CATARACT EXTRACTION Bilateral   . COLONOSCOPY W/ POLYPECTOMY    . COLPORRHAPHY  02/15/01   Anterior repair   . HARDWARE REMOVAL N/A 09/28/2015   Procedure: REMOVAL OF RIGHT PEDICLE SCREWS AND ROD L3, L4, L5 Wiltse approach;  Surgeon: Jessy Oto, MD;  Location: [facility];  Service: Orthopedics;  Laterality: N/A;  . ROTATOR CUFF REPAIR Left 06/15/08    Family History  Problem Relation Age of Onset  . Rheum arthritis Mother   . Hypertension Father     Social History   Tobacco Use  . Smoking status: Former Smoker    Packs/day: 1.00    Years: 3.00    Pack years: 3.00    Types: Cigarettes    Quit date: 06/23/1965    Years since quitting: 53.5  . Smokeless tobacco: Never Used  Substance Use Topics  . Alcohol use: No  . Drug use: No    Patient Active Problem List   Diagnosis Date Noted  . Carpal tunnel syndrome, bilateral upper limbs 12/04/2017    Class: Chronic  . Right lumbar radiculopathy 09/28/2015    Class: Chronic  . Painful orthopaedic hardware ([place]) 09/28/2015    Class: Chronic    Home Medications:    Current Meds  Medication Sig  . aspirin-acetaminophen-caffeine (EXCEDRIN EXTRA STRENGTH) 250-250-65 MG tablet Take by mouth every 6 (six) hours as  needed for headache.  . B Complex Vitamins (VITAMIN B COMPLEX) TABS Take 1 tablet by mouth daily.  . chlorthalidone (HYGROTON) 25 MG tablet Take 25 mg by mouth daily.  . Cholecalciferol (VITAMIN D3) 5000 units CAPS Take by mouth.  . Coenzyme Q10 100 MG capsule Take 300 mg by mouth daily.  Marland Kitchen diltiazem (CARDIZEM CD) 120 MG 24 hr capsule Take 120 mg by mouth daily.  . diphenhydramine-acetaminophen (TYLENOL PM) 25-500 MG TABS tablet Take 1 tablet by mouth at bedtime as needed.  . gabapentin (NEURONTIN) 300 MG capsule TAKE ONE CAPSULE BY MOUTH EVERY MORNING AND TAKE 2 CAPSULES AT BEDTIME  . HYDROcodone-acetaminophen (NORCO) 5-325 MG tablet Take 1 tablet by mouth every 4  (four) hours as needed for moderate pain.  . magnesium oxide (MAG-OX) 400 MG tablet Take 400 mg by mouth daily.  . Melatonin 3 MG TABS Take by mouth.  . rivaroxaban (XARELTO) 20 MG TABS tablet Take by mouth.  . traMADol (ULTRAM) 50 MG tablet TAKE 2 TABLETS (100 MG TOTAL) BY MOUTH EVERY 6 (SIX) HOURS AS NEEDED FOR UP TO 7 DAYS.    Allergies:   Mushroom extract complex, Statins, and Amoxicillin-pot clavulanate  Review of Systems (ROS): Review of Systems  Constitutional: Negative for chills and fever.  Respiratory: Negative for cough and shortness of breath.   Cardiovascular: Negative for chest pain and palpitations.  Musculoskeletal:       LEFT foot pain and swelling  Skin: Positive for color change and wound.     Vital Signs: Today's Vitals   01/04/19 1812 01/04/19 1814  BP:  (!) 157/70  Pulse:  (!) 53  Resp:  17  Temp:  98.3 F (36.8 C)  TempSrc:  Oral  SpO2:  100%  Weight: 140 lb (63.5 kg)   Height: 5\' 4"  (1.626 m)   PainSc: 2      Physical Exam: Physical Exam  Constitutional: She is oriented to person, place, and time and well-developed, well-nourished, and in no distress.  HENT:  Head: Normocephalic and atraumatic.  Mouth/Throat: Mucous membranes are normal.  Eyes: Pupils are equal, round, and reactive to light. EOM are normal.  Cardiovascular: Regular rhythm, normal heart sounds and intact distal pulses. Bradycardia present. Exam reveals no gallop and no friction rub.  No murmur heard. Pulmonary/Chest: Effort normal and breath sounds normal. No respiratory distress. She has no wheezes. She has no rales.  Musculoskeletal:     Left foot: Normal range of motion and normal capillary refill. Tenderness and swelling present.       Feet:  Neurological: She is alert and oriented to person, place, and time. Gait normal. GCS score is 15.  Skin: Skin is warm and dry. No rash noted.  Psychiatric: Mood, memory, affect and judgment normal.  Nursing note and vitals reviewed.    Urgent Care Treatments / Results:   LABS: PLEASE NOTE: all labs that were ordered this encounter are listed, however only abnormal results are displayed. Labs Reviewed - No data to display  EKG: -None  RADIOLOGY: Dg Foot Complete Left  Result Date: 01/04/2019 CLINICAL DATA:  Pain, swelling EXAM: LEFT FOOT - COMPLETE 3+ VIEW COMPARISON:  None. FINDINGS: Old healed distal 5th metatarsal fracture. Mild degenerative changes of the 1st MTP joint. No acute bony abnormality. Specifically, no fracture, subluxation, or dislocation. Soft tissue swelling along the dorsum of the foot. IMPRESSION: No acute bony abnormality. Electronically Signed   By: Rolm Baptise M.D.   On: 01/04/2019 18:52  PROCEDURES: Procedures  MEDICATIONS RECEIVED THIS VISIT: Medications - No data to display  PERTINENT CLINICAL COURSE NOTES/UPDATES:   Initial Impression / Assessment and Plan / Urgent Care Course:  Pertinent labs & imaging results that were available during my care of the patient were personally reviewed by me and considered in my medical decision making (see lab/imaging section of note for values and interpretations).  Samantha Garcia is a 85 y.o. female who presents to [facility] today with complaints of Foot Pain  Patient overall well appearing and in no acute distress today in clinic. Exam as above. Diagnostic radiographs of the LEFT foot reveal no acute fractures or dislocations. There were no subcutaneous gas collections present to suggest osteomyelitis. Exam consistent with cellulitis of the foot secondary to an unknown etiology. There have been no injuries or known insect bites. Given chronicity of symptoms, coupled with observed cellulitic changes, will cover for bacterial skin injection using a 10 day course of doxycycline. Patient educated to monitor for signs and symptoms of progressing infection, which would include increased redness, swelling, streaking, drainage, pain, and the  development of a fever. Patient to soak foot in warm Epson salt water BID to help with selling and associated pain. May use Tylenol and/or Ibuprofen as needed for pain.    Discussed follow up with primary care physician in 1 week for re-evaluation. I have reviewed the follow up and strict return precautions for any new or worsening symptoms. Patient is aware of symptoms that would be deemed urgent/emergent, and would thus require further evaluation either here or in the emergency department. At the time of discharge, she verbalized understanding and consent with the discharge plan as it was reviewed with her. All questions were fielded by provider and/or clinic staff prior to patient discharge.    Final Clinical Impressions / Urgent Care Diagnoses:   Final diagnoses:  Cellulitis of left foot    New Prescriptions:  [place] Controlled Substance Registry consulted? Not Applicable  Meds ordered this encounter  Medications  . doxycycline (VIBRAMYCIN) 100 MG capsule    Sig: Take 1 capsule (100 mg total) by mouth 2 (two) times daily.    Dispense:  20 capsule    Refill:  0    Recommended Follow up Care:  Patient encouraged to follow up with the following provider within the specified time frame, or sooner as dictated by the severity of her symptoms. As always, she was instructed that for any urgent/emergent care needs, she should seek care either here or in the emergency department for more immediate evaluation.  Follow-up Information    Carolan Shiver, MD In 1 week.   Specialty: Internal Medicine Contact information: [address] [facility] Internal Medicine [place] [address] [phone]         NOTE: This note was prepared using Dragon dictation software along with smaller phrase technology. Despite my best ability to proofread, there is the potential that transcriptional errors may still occur from this process, and are completely unintentional.     Karen Kitchens, NP 01/05/19 [phone]

## 2019-01-25 ENCOUNTER — Other Ambulatory Visit: Payer: Self-pay | Admitting: Specialist

## 2019-01-25 NOTE — Telephone Encounter (Signed)
Sent request to Dr. Nitka 

## 2019-01-25 NOTE — Telephone Encounter (Signed)
Patient called requesting an RX refill on her Tramadol.  [facility] [address] [facility] [place].  CB#[phone].

## 2019-01-26 ENCOUNTER — Other Ambulatory Visit: Payer: Self-pay | Admitting: Specialist

## 2019-01-26 MED ORDER — TRAMADOL HCL 50 MG PO TABS: 100.0000 mg | ORAL_TABLET | Freq: Four times a day (QID) | ORAL | 0 refills | Status: DC | PRN

## 2019-02-18 ENCOUNTER — Other Ambulatory Visit: Payer: Self-pay | Admitting: Specialist

## 2019-02-18 MED ORDER — TRAMADOL HCL 50 MG PO TABS: 100.0000 mg | ORAL_TABLET | Freq: Four times a day (QID) | ORAL | 0 refills | Status: DC | PRN

## 2019-02-18 NOTE — Telephone Encounter (Signed)
I Called and lmom for pt that we have not rx'd Chlorthalidone for her, that she may have gotten it from her PCP or a cardiologist.  However Id di send for refill request to Dr. Louanne Skye.

## 2019-02-18 NOTE — Telephone Encounter (Signed)
Patient called needing Rx refilled (Tramadol  50 mg and Chlorthalidone 25 mg) [facility] [address] in [place]  Ph# [phone] Patient asked that the other pharmacy information be removed   The number to contact patient is [phone]

## 2019-03-14 ENCOUNTER — Other Ambulatory Visit: Payer: Self-pay | Admitting: Orthopedic Surgery

## 2019-03-14 NOTE — Addendum Note (Signed)
Addended by: Minda Ditto, Geoffery Spruce on: 03/14/2019 02:56 PM   Modules accepted: Orders

## 2019-03-14 NOTE — Telephone Encounter (Signed)
Samantha Garcia called in for refills of her medications.  She needs Tramadol  50mg  tab and gabapentin 300mg  capsules. [facility] [address] In [place].

## 2019-03-15 MED ORDER — GABAPENTIN 600 MG PO TABS: ORAL_TABLET | ORAL | 0 refills | Status: DC

## 2019-03-15 MED ORDER — TRAMADOL HCL 50 MG PO TABS: 100.0000 mg | ORAL_TABLET | Freq: Four times a day (QID) | ORAL | 0 refills | Status: DC | PRN

## 2019-03-15 NOTE — Telephone Encounter (Signed)
I called and lmom advised her rx's were sent to the pharmacy

## 2019-04-05 ENCOUNTER — Other Ambulatory Visit: Payer: Self-pay | Admitting: Specialist

## 2019-04-05 NOTE — Telephone Encounter (Signed)
Received voicemail message from patient needing Rx refilled (Tramadol) 50 mg. [facility] uses the [facility] [place] Ph# [phone] or [phone]   The number to contact patient is [phone]

## 2019-04-07 ENCOUNTER — Other Ambulatory Visit: Payer: Self-pay | Admitting: Specialist

## 2019-04-07 MED ORDER — TRAMADOL HCL 50 MG PO TABS: 100.0000 mg | ORAL_TABLET | Freq: Four times a day (QID) | ORAL | 0 refills | Status: DC | PRN

## 2019-05-05 ENCOUNTER — Other Ambulatory Visit: Payer: Self-pay | Admitting: Specialist

## 2019-05-05 NOTE — Telephone Encounter (Signed)
Requests for refills has been sent to Dr. Louanne Skye

## 2019-05-05 NOTE — Telephone Encounter (Signed)
Patient called needing Rx refilled Tramadol 50mg ,Gabapentin 300mg  capsules [facility] [place] [facility] [address]  Ph# [phone] or [phone]   The number to contact patient is [phone]

## 2019-05-06 MED ORDER — GABAPENTIN 600 MG PO TABS: ORAL_TABLET | ORAL | 0 refills | Status: DC

## 2019-05-06 MED ORDER — TRAMADOL HCL 50 MG PO TABS: 100.0000 mg | ORAL_TABLET | Freq: Four times a day (QID) | ORAL | 0 refills | Status: DC | PRN

## 2019-05-26 ENCOUNTER — Other Ambulatory Visit: Payer: Self-pay | Admitting: Specialist

## 2019-05-26 MED ORDER — TRAMADOL HCL 50 MG PO TABS: 100.0000 mg | ORAL_TABLET | Freq: Four times a day (QID) | ORAL | 0 refills | Status: AC | PRN

## 2019-05-26 NOTE — Telephone Encounter (Signed)
Patient called requesting an RX refill on her Tramadol.  [facility] [place] [place]  She stated that this pharmacy has a drive thru which makes it easier for her.  CB#[phone].  Thank you.

## 2019-05-26 NOTE — Telephone Encounter (Signed)
Sent refill request to Dr. Louanne Skye

## 2019-06-13 ENCOUNTER — Encounter: Payer: Self-pay | Admitting: Specialist

## 2019-06-13 ENCOUNTER — Ambulatory Visit (INDEPENDENT_AMBULATORY_CARE_PROVIDER_SITE_OTHER): Payer: Medicare HMO

## 2019-06-13 ENCOUNTER — Other Ambulatory Visit: Payer: Self-pay

## 2019-06-13 ENCOUNTER — Ambulatory Visit: Payer: PPO | Admitting: Specialist

## 2019-06-13 VITALS — BP 168/88 | HR 67 | Ht 64.0 in | Wt 150.0 lb

## 2019-06-13 DIAGNOSIS — M545 Low back pain, unspecified: Secondary | ICD-10-CM

## 2019-06-13 DIAGNOSIS — M48062 Spinal stenosis, lumbar region with neurogenic claudication: Secondary | ICD-10-CM

## 2019-06-13 DIAGNOSIS — M4317 Spondylolisthesis, lumbosacral region: Secondary | ICD-10-CM | POA: Diagnosis not present

## 2019-06-13 MED ORDER — GABAPENTIN 600 MG PO TABS: ORAL_TABLET | ORAL | 0 refills | Status: AC

## 2019-06-13 NOTE — Progress Notes (Signed)
Office Visit Note   Patient: Samantha Garcia           Date of Birth: 11/25/1931           MRN: [id number] Visit Date: 06/13/2019              Requested by: Carolan Shiver, MD [facility] Internal Medicine [place],  [place] [address] PCP: Carolan Shiver, MD   Assessment & Plan: Visit Diagnoses:  1. Low back pain without sciatica, unspecified back pain laterality, unspecified chronicity   2. Spondylolisthesis, lumbosacral region   3. Spinal stenosis of lumbar region with neurogenic claudication     Plan:No lifting greater than 10 lbs. May use ice or moist heat for pain. Weight loss is of benefit. Handicap license is approved. Exercises that work on Insurance account manager the anterior abdomenal muscles like partial situps, leg lifts and avoidance for backwards bending.   Do not take arthritis medication that can cause blood thinning and a bleed into kidneys, retroperitoneal or into the brain. Will try a TENS unit for the right leg to see if it will decrease pain.  Knee is suffering from osteoarthritis, only real proven treatments are Weight loss, NSIADs like diclofenac gel and exercise. Well padded shoes help. Ice the knee that is suffering from osteoarthritis, only real proven treatments are Weight loss, NSIADs like diclofenac gel and exercise. Well padded shoes help. Ice the knee 2-3 times a day 15-20 mins at a time.-3 times a day 15-20 mins at a time. Hot showers in the AM.  Injection with steroid may be of benefit. Hemp CBD capsules, amazon.com 5,000-7,000 mg per bottle, 60 capsules per bottle, take one capsule twice a day. Cane in the left hand to use with left leg weight bearing. Follow-Up Instructions: Return in about 3 months (around 09/11/2019).   Orders:  Orders Placed This Encounter  Procedures  . XR Lumbar Spine 2-3 Views   Meds ordered this encounter  Medications  . gabapentin (NEURONTIN) 600 MG tablet    Sig: Take 1  tablet (600 mg total) by mouth at bedtime AND 0.5 tablets (300 mg total) daily before breakfast.    Dispense:  135 tablet    Refill:  0      Procedures: No procedures performed   Clinical Data: No additional findings.   Subjective: Chief Complaint  Patient presents with  . Lower Back - Pain    85 year old female with history of back pain and lumbar spinal stenosis. She has chronic pain but is not wanting any surgical solution. She takes tramadol one tablet po BID. She has pain in the AM and she can hardly walk, the pain in the right calf is so severe. As the day goes on the pain improves, no pain with sleeping or at night. Getting up in the AM is when the pain is present. Even thought the bathroom is right across the bedroom she notices the pain as soon as she gets up. She is planning a trip to [place], request her gabapentin be called into her pharmacy in [place]. She is having a difficult time. She prefers the drive through for the prescriptions. No bowel or bladder difficulty. Pain is into the calf posteriorly. She had an ultrasound of the right leg done at the cardiologist, [place] [facility] with [facility]. She is on Xarelto for afib.   Review of Systems  Constitutional: Negative.   HENT: Negative.  Negative for congestion,  dental problem, drooling, ear discharge, ear pain, facial swelling, hearing loss, mouth sores, nosebleeds, postnasal drip, rhinorrhea, sinus pressure, sinus pain, sneezing, sore throat, tinnitus, trouble swallowing and voice change.   Eyes: Positive for visual disturbance.  Respiratory: Negative.   Cardiovascular: Negative.   Gastrointestinal: Negative.   Endocrine: Negative for cold intolerance, heat intolerance, polydipsia, polyphagia and polyuria.  Genitourinary: Negative.   Musculoskeletal: Negative.   Skin: Negative.   Allergic/Immunologic: Negative.   Neurological: Positive for weakness and numbness. Negative for dizziness, tremors, seizures, syncope,  facial asymmetry, speech difficulty, light-headedness and headaches.  Hematological: Negative.  Negative for adenopathy. Does not bruise/bleed easily.  Psychiatric/Behavioral: Negative.  Negative for agitation, behavioral problems, confusion, decreased concentration, dysphoric mood, hallucinations, self-injury, sleep disturbance and suicidal ideas. The patient is not nervous/anxious and is not hyperactive.      Objective: Vital Signs: BP (!) 168/88   Pulse 67   Ht 5\' 4"  (1.626 m)   Wt 150 lb (68 kg)   BMI 25.75 kg/m   Physical Exam Constitutional:      Appearance: She is well-developed.  HENT:     Head: Normocephalic and atraumatic.  Eyes:     Pupils: Pupils are equal, round, and reactive to light.  Pulmonary:     Effort: Pulmonary effort is normal.     Breath sounds: Normal breath sounds.  Abdominal:     General: Bowel sounds are normal.     Palpations: Abdomen is soft.  Musculoskeletal:     Cervical back: Normal range of motion and neck supple.     Lumbar back: Negative right straight leg raise test and negative left straight leg raise test.  Skin:    General: Skin is warm and dry.  Neurological:     Mental Status: She is alert and oriented to person, place, and time.  Psychiatric:        Behavior: Behavior normal.        Thought Content: Thought content normal.        Judgment: Judgment normal.     Back Exam   Tenderness  The patient is experiencing tenderness in the lumbar.  Range of Motion  Extension: abnormal  Flexion: normal  Lateral bend right: abnormal  Lateral bend left: abnormal  Rotation right: abnormal  Rotation left: abnormal   Muscle Strength  Right Quadriceps:  5/5  Left Quadriceps:  5/5  Right Hamstrings:  5/5  Left Hamstrings:  5/5   Tests  Straight leg raise right: negative Straight leg raise left: negative  Reflexes  Patellar: 0/4 Achilles: 0/4 Biceps: 0/4  Other  Toe walk: normal Heel walk: normal Sensation: decreased Gait:  abnormal  Erythema: no back redness Scars: present  Comments:  Left leg unilateral swelling left calf and into the left foot.       Specialty Comments:  No specialty comments available.  Imaging: XR Lumbar Spine 2-3 Views  Result Date: 06/13/2019 AP and lateral flexion and extension radiographs show the grade 1-2 slip at L5-S1 with fusion and interbody XLIF cages at L3-4 and L4-5. The degree of anterolisthesis at L5-S1 is greater than seen on the pre removal of posterior hardware radiographs. There is rotationof the disc with flexion and extension that likely causes right lateral recess stenosis and right L5 foramenal stenosis.     PMFS History: Patient Active Problem List   Diagnosis Date Noted  . Right lumbar radiculopathy 09/28/2015    Priority: High    Class: Chronic  . Painful orthopaedic hardware ([place])  09/28/2015    Priority: High    Class: Chronic  . Carpal tunnel syndrome, bilateral upper limbs 12/04/2017    Priority: Medium    Class: Chronic   Past Medical History:  Diagnosis Date  . Arthritis    B Knee  . Atrial fibrillation ([place])   . Cancer ([place])    Left Breast; 5 years cancer free(7/17)  . Complication of anesthesia    Pt stated it took her a long time to wake up after having Hysterectomy in the 70s, but has not had any issues since  . Dysrhythmia    Atrial Fibrillation  . Hyperlipidemia   . Hypertension    controlled by medication  . PPD positive 1968    Family History  Problem Relation Age of Onset  . Rheum arthritis Mother   . Hypertension Father     Past Surgical History:  Procedure Laterality Date  . ABDOMINAL HYSTERECTOMY  09/20/75  . APPENDECTOMY    . BACK SURGERY  03/12/10  . BREAST LUMPECTOMY Left   . BREAST SURGERY Left 11/14/10   DCIS carcinoma InSitu   . CARDIAC CATHETERIZATION    . CARPAL TUNNEL RELEASE Right 12/04/2017   Procedure: RIGHT OPEN CARPAL TUNNEL RELEASE;  Surgeon: Jessy Oto, MD;  Location: [facility];  Service: Orthopedics;  Laterality: Right;  Bier block  . CARPAL TUNNEL RELEASE Left 01/08/2018   Procedure: LEFT CARPAL TUNNEL RELEASE;  Surgeon: Jessy Oto, MD;  Location: [facility];  Service: Orthopedics;  Laterality: Left;  . CATARACT EXTRACTION Bilateral   . COLONOSCOPY W/ POLYPECTOMY    . COLPORRHAPHY  02/15/01   Anterior repair   . HARDWARE REMOVAL N/A 09/28/2015   Procedure: REMOVAL OF RIGHT PEDICLE SCREWS AND ROD L3, L4, L5 [place]iltse approach;  Surgeon: Jessy Oto, MD;  Location: [facility];  Service: Orthopedics;  Laterality: N/A;  . ROTATOR CUFF REPAIR Left 06/15/08   Social History   Occupational History  . Not on file  Tobacco Use  . Smoking status: Former Smoker    Packs/day: 1.00    Years: 3.00    Pack years: 3.00    Types: Cigarettes    Quit date: 06/23/1965    Years since quitting: 54.0  . Smokeless tobacco: Never Used  Substance and Sexual Activity  . Alcohol use: No  . Drug use: No  . Sexual activity: Not on file

## 2019-06-13 NOTE — Patient Instructions (Addendum)
Avoid bending, stooping and avoid lifting weights greater than 10 lbs. Avoid prolong standing and walking. Avoid frequent bending and stooping  No lifting greater than 10 lbs. May use ice or moist heat for pain. Weight loss is of benefit. Handicap license is approved. Exercises that work on Insurance account manager the anterior abdomenal muscles like partial situps, leg lifts and avoidance for backwards bending.   Do not take arthritis medication that can cause blood thinning and a bleed into kidneys, retroperitoneal or into the brain. Will try a TENS unit for the right leg to see if it will decrease pain.  Knee is suffering from osteoarthritis, only real proven treatments are Weight loss, NSIADs like diclofenac gel and exercise. Well padded shoes help. Ice the knee that is suffering from osteoarthritis, only real proven treatments are Weight loss, NSIADs like diclofenac gel and exercise. Well padded shoes help. Ice the knee 2-3 times a day 15-20 mins at a time.-3 times a day 15-20 mins at a time. Hot showers in the AM.  Injection with steroid may be of benefit. Hemp CBD capsules, amazon.com 5,000-7,000 mg per bottle, 60 capsules per bottle, take one capsule twice a day. Cane in the left hand to use with left leg weight bearing.  Follow-Up Instructions: No follow-ups on file.

## 2020-04-08 DIAGNOSIS — W19XXXA Unspecified fall, initial encounter: Secondary | ICD-10-CM

## 2020-04-08 DIAGNOSIS — I10 Essential (primary) hypertension: Secondary | ICD-10-CM | POA: Insufficient documentation

## 2020-04-08 DIAGNOSIS — Z87891 Personal history of nicotine dependence: Secondary | ICD-10-CM | POA: Insufficient documentation

## 2020-04-08 DIAGNOSIS — Z79899 Other long term (current) drug therapy: Secondary | ICD-10-CM | POA: Insufficient documentation

## 2020-04-08 DIAGNOSIS — S0181XA Laceration without foreign body of other part of head, initial encounter: Secondary | ICD-10-CM | POA: Insufficient documentation

## 2020-04-08 DIAGNOSIS — Z853 Personal history of malignant neoplasm of breast: Secondary | ICD-10-CM | POA: Insufficient documentation

## 2020-04-08 DIAGNOSIS — F0390 Unspecified dementia without behavioral disturbance: Secondary | ICD-10-CM | POA: Insufficient documentation

## 2020-04-08 DIAGNOSIS — Z7982 Long term (current) use of aspirin: Secondary | ICD-10-CM | POA: Insufficient documentation

## 2020-04-08 DIAGNOSIS — Y92129 Unspecified place in nursing home as the place of occurrence of the external cause: Secondary | ICD-10-CM | POA: Insufficient documentation

## 2020-04-08 DIAGNOSIS — Z23 Encounter for immunization: Secondary | ICD-10-CM | POA: Insufficient documentation

## 2020-04-08 DIAGNOSIS — S0993XA Unspecified injury of face, initial encounter: Secondary | ICD-10-CM | POA: Diagnosis present

## 2022-04-23 DEATH — deceased
